# Patient Record
Sex: Female | Born: 1940 | Race: White | Hispanic: No | Smoking: Never smoker
Health system: Southern US, Community
[De-identification: ages and names within clinical notes are randomized; demographics above are authoritative.]

## PROBLEM LIST (undated history)

## (undated) DIAGNOSIS — E7800 Pure hypercholesterolemia, unspecified: Secondary | ICD-10-CM

## (undated) DIAGNOSIS — I10 Essential (primary) hypertension: Secondary | ICD-10-CM

## (undated) DIAGNOSIS — C801 Malignant (primary) neoplasm, unspecified: Secondary | ICD-10-CM

## (undated) DIAGNOSIS — M109 Gout, unspecified: Secondary | ICD-10-CM

## (undated) DIAGNOSIS — H9190 Unspecified hearing loss, unspecified ear: Secondary | ICD-10-CM

## (undated) DIAGNOSIS — L659 Nonscarring hair loss, unspecified: Secondary | ICD-10-CM

## (undated) DIAGNOSIS — E119 Type 2 diabetes mellitus without complications: Secondary | ICD-10-CM

## (undated) DIAGNOSIS — M1990 Unspecified osteoarthritis, unspecified site: Secondary | ICD-10-CM

## (undated) HISTORY — PX: BACK SURGERY: SHX140

## (undated) HISTORY — PX: ABDOMINAL HYSTERECTOMY: SHX81

## (undated) HISTORY — PX: CERVICAL DISC SURGERY: SHX588

## (undated) HISTORY — PX: CRANIECTOMY FOR EXCISION OF ACOUSTIC NEUROMA: SUR324

---

## 1988-02-02 HISTORY — PX: TOTAL KNEE ARTHROPLASTY: SHX125

## 2014-02-06 NOTE — Patient Instructions (Addendum)
Your procedure is scheduled on: 02/11/2014  Report to [facility] at  800   AM.  Call this number if you have problems the morning of surgery: [phone]   Do not eat food or drink liquids :After Midnight.      Take these medicines the morning of surgery with A SIP OF WATER:  Alloopurinol, azor, voltaren   Do not wear jewelry, make-up or nail polish.  Do not wear lotions, powders, or perfumes.   Do not shave 48 hours prior to surgery.  Do not bring valuables to the hospital.  Contacts, dentures or bridgework may not be worn into surgery.  Leave suitcase in the car. After surgery it may be brought to your room.  For patients admitted to the hospital, checkout time is 11:00 AM the day of discharge.   Patients discharged the day of surgery will not be allowed to drive home.  :     Please read over the following fact sheets that you were given: Coughing and Deep Breathing, Surgical Site Infection Prevention, Anesthesia Post-op Instructions and Care and Recovery After Surgery    Cataract A cataract is a clouding of the lens of the eye. When a lens becomes cloudy, vision is reduced based on the degree and nature of the clouding. Many cataracts reduce vision to some degree. Some cataracts make people more near-sighted as they develop. Other cataracts increase glare. Cataracts that are ignored and become worse can sometimes look white. The white color can be seen through the pupil. CAUSES   Aging. However, cataracts may occur at any age, even in newborns.   Certain drugs.   Trauma to the eye.   Certain diseases such as diabetes.   Specific eye diseases such as chronic inflammation inside the eye or a sudden attack of a rare form of glaucoma.   Inherited or acquired medical problems.  SYMPTOMS   Gradual, progressive drop in vision in the affected eye.   Severe, rapid visual loss. This most often happens when trauma is the cause.  DIAGNOSIS  To detect a cataract, an eye doctor examines  the lens. Cataracts are best diagnosed with an exam of the eyes with the pupils enlarged (dilated) by drops.  TREATMENT  For an early cataract, vision may improve by using different eyeglasses or stronger lighting. If that does not help your vision, surgery is the only effective treatment. A cataract needs to be surgically removed when vision loss interferes with your everyday activities, such as driving, reading, or watching TV. A cataract may also have to be removed if it prevents examination or treatment of another eye problem. Surgery removes the cloudy lens and usually replaces it with a substitute lens (intraocular lens, IOL).  At a time when both you and your doctor agree, the cataract will be surgically removed. If you have cataracts in both eyes, only one is usually removed at a time. This allows the operated eye to heal and be out of danger from any possible problems after surgery (such as infection or poor wound healing). In rare cases, a cataract may be doing damage to your eye. In these cases, your caregiver may advise surgical removal right away. The vast majority of people who have cataract surgery have better vision afterward. HOME CARE INSTRUCTIONS  If you are not planning surgery, you may be asked to do the following:  Use different eyeglasses.   Use stronger or brighter lighting.   Ask your eye doctor about reducing your medicine dose  or changing medicines if it is thought that a medicine caused your cataract. Changing medicines does not make the cataract go away on its own.   Become familiar with your surroundings. Poor vision can lead to injury. Avoid bumping into things on the affected side. You are at a higher risk for tripping or falling.   Exercise extreme care when driving or operating machinery.   Wear sunglasses if you are sensitive to bright light or experiencing problems with glare.  SEEK IMMEDIATE MEDICAL CARE IF:   You have a worsening or sudden vision loss.    You notice redness, swelling, or increasing pain in the eye.   You have a fever.  Document Released: 01/18/2005 Document Revised: 01/07/2011 Document Reviewed: 09/11/2010 [facility] Patient Information 2012 [place].PATIENT INSTRUCTIONS POST-ANESTHESIA  IMMEDIATELY FOLLOWING SURGERY:  Do not drive or operate machinery for the first twenty four hours after surgery.  Do not make any important decisions for twenty four hours after surgery or while taking narcotic pain medications or sedatives.  If you develop intractable nausea and vomiting or a severe headache please notify your doctor immediately.  FOLLOW-UP:  Please make an appointment with your surgeon as instructed. You do not need to follow up with anesthesia unless specifically instructed to do so.  WOUND CARE INSTRUCTIONS (if applicable):  Keep a dry clean dressing on the anesthesia/puncture wound site if there is drainage.  Once the wound has quit draining you may leave it open to air.  Generally you should leave the bandage intact for twenty four hours unless there is drainage.  If the epidural site drains for more than 36-48 hours please call the anesthesia department.  QUESTIONS?:  Please feel free to call your physician or the hospital operator if you have any questions, and they will be happy to assist you.

## 2014-02-07 ENCOUNTER — Encounter (HOSPITAL_COMMUNITY)
Admission: RE | Admit: 2014-02-07 | Discharge: 2014-02-07 | Disposition: A | Discharge status: Home / Self Care | Payer: Medicare Other | Source: Ambulatory Visit | Attending: Ophthalmology | Admitting: Ophthalmology

## 2014-02-07 ENCOUNTER — Encounter (HOSPITAL_COMMUNITY): Payer: Self-pay

## 2014-02-07 DIAGNOSIS — Z01818 Encounter for other preprocedural examination: Secondary | ICD-10-CM | POA: Diagnosis not present

## 2014-02-07 DIAGNOSIS — I4510 Unspecified right bundle-branch block: Secondary | ICD-10-CM | POA: Insufficient documentation

## 2014-02-07 DIAGNOSIS — I252 Old myocardial infarction: Secondary | ICD-10-CM | POA: Diagnosis not present

## 2014-02-07 HISTORY — DX: Gout, unspecified: M10.9

## 2014-02-07 HISTORY — DX: Unspecified hearing loss, unspecified ear: H91.90

## 2014-02-07 HISTORY — DX: Unspecified osteoarthritis, unspecified site: M19.90

## 2014-02-07 HISTORY — DX: Pure hypercholesterolemia, unspecified: E78.00

## 2014-02-07 HISTORY — DX: Malignant (primary) neoplasm, unspecified: C80.1

## 2014-02-07 HISTORY — DX: Essential (primary) hypertension: I10

## 2014-02-07 HISTORY — DX: Type 2 diabetes mellitus without complications: E11.9

## 2014-02-07 LAB — BASIC METABOLIC PANEL
Anion gap: 5 (ref 5–15)
BUN: 29 mg/dL — AB (ref 6–23)
CALCIUM: 9.4 mg/dL (ref 8.4–10.5)
CO2: 26 mmol/L (ref 19–32)
Chloride: 107 mEq/L (ref 96–112)
Creatinine, Ser: 1.27 mg/dL — ABNORMAL HIGH (ref 0.50–1.10)
GFR calc Af Amer: 46 mL/min — ABNORMAL LOW (ref >90)
GFR calc non Af Amer: 40 mL/min — ABNORMAL LOW (ref >90)
GLUCOSE: 108 mg/dL — AB (ref 70–99)
Potassium: 4.7 mmol/L (ref 3.5–5.1)
SODIUM: 138 mmol/L (ref 135–145)

## 2014-02-07 LAB — HEMOGLOBIN AND HEMATOCRIT, BLOOD
HCT: 40.7 % (ref 36.0–46.0)
Hemoglobin: 13.1 g/dL (ref 12.0–15.0)

## 2014-02-07 NOTE — Pre-Procedure Instructions (Signed)
Patient given information to sign up for my chart at home. 

## 2014-02-11 ENCOUNTER — Encounter (HOSPITAL_COMMUNITY): Payer: Self-pay | Admitting: *Deleted

## 2014-02-11 ENCOUNTER — Ambulatory Visit (HOSPITAL_COMMUNITY)
Admission: RE | Admit: 2014-02-11 | Discharge: 2014-02-11 | Disposition: A | Discharge status: Home / Self Care | Payer: Medicare Other | Source: Ambulatory Visit | Attending: Ophthalmology | Admitting: Ophthalmology

## 2014-02-11 ENCOUNTER — Ambulatory Visit (HOSPITAL_COMMUNITY): Payer: Medicare Other | Admitting: Anesthesiology

## 2014-02-11 ENCOUNTER — Encounter (HOSPITAL_COMMUNITY)
Admission: RE | Disposition: A | Discharge status: Home / Self Care | Payer: Self-pay | Source: Ambulatory Visit | Attending: Ophthalmology

## 2014-02-11 DIAGNOSIS — H9192 Unspecified hearing loss, left ear: Secondary | ICD-10-CM | POA: Diagnosis not present

## 2014-02-11 DIAGNOSIS — Z888 Allergy status to other drugs, medicaments and biological substances status: Secondary | ICD-10-CM | POA: Insufficient documentation

## 2014-02-11 DIAGNOSIS — M1990 Unspecified osteoarthritis, unspecified site: Secondary | ICD-10-CM | POA: Insufficient documentation

## 2014-02-11 DIAGNOSIS — E118 Type 2 diabetes mellitus with unspecified complications: Secondary | ICD-10-CM | POA: Diagnosis not present

## 2014-02-11 DIAGNOSIS — Z85820 Personal history of malignant melanoma of skin: Secondary | ICD-10-CM | POA: Insufficient documentation

## 2014-02-11 DIAGNOSIS — I10 Essential (primary) hypertension: Secondary | ICD-10-CM | POA: Diagnosis not present

## 2014-02-11 DIAGNOSIS — M109 Gout, unspecified: Secondary | ICD-10-CM | POA: Insufficient documentation

## 2014-02-11 DIAGNOSIS — H25812 Combined forms of age-related cataract, left eye: Secondary | ICD-10-CM | POA: Insufficient documentation

## 2014-02-11 DIAGNOSIS — E780 Pure hypercholesterolemia: Secondary | ICD-10-CM | POA: Diagnosis not present

## 2014-02-11 HISTORY — PX: CATARACT EXTRACTION W/PHACO: SHX586

## 2014-02-11 LAB — GLUCOSE, CAPILLARY: Glucose-Capillary: 101 mg/dL — ABNORMAL HIGH (ref 70–99)

## 2014-02-11 SURGERY — PHACOEMULSIFICATION, CATARACT, WITH IOL INSERTION
Anesthesia: Monitor Anesthesia Care | Site: Eye | Laterality: Left

## 2014-02-11 MED ORDER — NEOMYCIN-POLYMYXIN-DEXAMETH 3.5-10000-0.1 OP SUSP
OPHTHALMIC | Status: DC | PRN
  Administered 2014-02-11: 2 [drp] via OPHTHALMIC

## 2014-02-11 MED ORDER — CYCLOPENTOLATE-PHENYLEPHRINE 0.2-1 % OP SOLN
1.0000 [drp] | OPHTHALMIC | Status: AC
  Administered 2014-02-11 (×3): 1 [drp] via OPHTHALMIC

## 2014-02-11 MED ORDER — POVIDONE-IODINE 5 % OP SOLN
OPHTHALMIC | Status: DC | PRN
  Administered 2014-02-11: 1

## 2014-02-11 MED ORDER — MIDAZOLAM HCL 2 MG/2ML IJ SOLN
INTRAMUSCULAR | Status: AC
Start: 2014-02-11 — End: 2014-02-11
  Filled 2014-02-11: qty 2

## 2014-02-11 MED ORDER — PROVISC 10 MG/ML IO SOLN
INTRAOCULAR | Status: DC | PRN
  Administered 2014-02-11: 0.85 mL via INTRAOCULAR

## 2014-02-11 MED ORDER — MIDAZOLAM HCL 2 MG/2ML IJ SOLN
1.0000 mg | INTRAMUSCULAR | Status: DC | PRN
  Administered 2014-02-11: 2 mg via INTRAVENOUS
  Filled 2014-02-11: qty 2

## 2014-02-11 MED ORDER — FENTANYL CITRATE 0.05 MG/ML IJ SOLN
25.0000 ug | INTRAMUSCULAR | Status: AC
  Administered 2014-02-11 (×2): 25 ug via INTRAVENOUS
  Filled 2014-02-11: qty 2

## 2014-02-11 MED ORDER — EPINEPHRINE HCL 1 MG/ML IJ SOLN
INTRAMUSCULAR | Status: AC
  Filled 2014-02-11: qty 1

## 2014-02-11 MED ORDER — ONDANSETRON HCL 4 MG/2ML IJ SOLN: 4.0000 mg | Freq: Once | INTRAMUSCULAR | Status: DC | PRN

## 2014-02-11 MED ORDER — LACTATED RINGERS IV SOLN
INTRAVENOUS | Status: DC
  Administered 2014-02-11: 09:00:00 via INTRAVENOUS

## 2014-02-11 MED ORDER — BSS IO SOLN
INTRAOCULAR | Status: DC | PRN
  Administered 2014-02-11: 15 mL via INTRAOCULAR

## 2014-02-11 MED ORDER — LIDOCAINE HCL 3.5 % OP GEL
1.0000 "application " | Freq: Once | OPHTHALMIC | Status: AC
  Administered 2014-02-11: 1 via OPHTHALMIC

## 2014-02-11 MED ORDER — EPINEPHRINE HCL 1 MG/ML IJ SOLN
INTRAOCULAR | Status: DC | PRN
  Administered 2014-02-11: 09:00:00

## 2014-02-11 MED ORDER — MIDAZOLAM HCL 5 MG/5ML IJ SOLN
INTRAMUSCULAR | Status: DC | PRN
  Administered 2014-02-11: 2 mg via INTRAVENOUS

## 2014-02-11 MED ORDER — PHENYLEPHRINE HCL 2.5 % OP SOLN
1.0000 [drp] | OPHTHALMIC | Status: AC
  Administered 2014-02-11 (×3): 1 [drp] via OPHTHALMIC

## 2014-02-11 MED ORDER — FENTANYL CITRATE 0.05 MG/ML IJ SOLN: 25.0000 ug | INTRAMUSCULAR | Status: DC | PRN

## 2014-02-11 MED ORDER — TETRACAINE HCL 0.5 % OP SOLN
1.0000 [drp] | OPHTHALMIC | Status: AC
  Administered 2014-02-11 (×3): 1 [drp] via OPHTHALMIC

## 2014-02-11 MED ORDER — LIDOCAINE HCL (PF) 1 % IJ SOLN
INTRAMUSCULAR | Status: DC | PRN
  Administered 2014-02-11: .3 mL

## 2014-02-11 SURGICAL SUPPLY — 34 items
CAPSULAR TENSION RING-AMO (OPHTHALMIC RELATED) IMPLANT
CLOTH BEACON ORANGE TIMEOUT ST (SAFETY) ×3 IMPLANT
EYE SHIELD UNIVERSAL CLEAR (GAUZE/BANDAGES/DRESSINGS) ×3 IMPLANT
GLOVE BIO SURGEON STRL SZ 6.5 (GLOVE) IMPLANT
GLOVE BIO SURGEONS STRL SZ 6.5 (GLOVE)
GLOVE BIOGEL PI IND STRL 6.5 (GLOVE) ×1 IMPLANT
GLOVE BIOGEL PI IND STRL 7.0 (GLOVE) IMPLANT
GLOVE BIOGEL PI IND STRL 7.5 (GLOVE) IMPLANT
GLOVE BIOGEL PI INDICATOR 6.5 (GLOVE) ×2
GLOVE BIOGEL PI INDICATOR 7.0 (GLOVE)
GLOVE BIOGEL PI INDICATOR 7.5 (GLOVE)
GLOVE ECLIPSE 6.5 STRL STRAW (GLOVE) IMPLANT
GLOVE ECLIPSE 7.0 STRL STRAW (GLOVE) IMPLANT
GLOVE ECLIPSE 7.5 STRL STRAW (GLOVE) IMPLANT
GLOVE EXAM NITRILE LRG STRL (GLOVE) ×3 IMPLANT
GLOVE EXAM NITRILE MD LF STRL (GLOVE) IMPLANT
GLOVE SKINSENSE NS SZ6.5 (GLOVE)
GLOVE SKINSENSE NS SZ7.0 (GLOVE)
GLOVE SKINSENSE STRL SZ6.5 (GLOVE) IMPLANT
GLOVE SKINSENSE STRL SZ7.0 (GLOVE) IMPLANT
KIT VITRECTOMY (OPHTHALMIC RELATED) IMPLANT
PAD ARMBOARD 7.5X6 YLW CONV (MISCELLANEOUS) ×3 IMPLANT
PROC W NO LENS (INTRAOCULAR LENS)
PROC W SPEC LENS (INTRAOCULAR LENS)
PROCESS W NO LENS (INTRAOCULAR LENS) IMPLANT
PROCESS W SPEC LENS (INTRAOCULAR LENS) IMPLANT
RETRACTOR IRIS SIGHTPATH (OPHTHALMIC RELATED) IMPLANT
RING MALYGIN (MISCELLANEOUS) IMPLANT
SIGHTPATH CAT PROC W REG LENS (Ophthalmic Related) ×3 IMPLANT
SYRINGE LUER LOK 1CC (MISCELLANEOUS) ×3 IMPLANT
TAPE SURG TRANSPARENT 2IN (GAUZE/BANDAGES/DRESSINGS) ×1 IMPLANT
TAPE TRANSPARENT 2IN (GAUZE/BANDAGES/DRESSINGS) ×2
VISCOELASTIC ADDITIONAL (OPHTHALMIC RELATED) IMPLANT
WATER STERILE IRR 250ML POUR (IV SOLUTION) ×3 IMPLANT

## 2014-02-11 NOTE — Discharge Instructions (Signed)

## 2014-02-11 NOTE — Anesthesia Preprocedure Evaluation (Signed)
Anesthesia Evaluation  Patient identified by MRN, date of birth, ID band Patient awake    Reviewed: Allergy & Precautions, NPO status , Patient's Chart, lab work & pertinent test results  Airway Mallampati: II       Dental  (+) Teeth Intact   Pulmonary neg pulmonary ROS,  breath sounds clear to auscultation        Cardiovascular hypertension, Rhythm:Regular Rate:Normal     Neuro/Psych Hx brain tumor x 2    GI/Hepatic   Endo/Other  diabetes (off meds now), Type 2  Renal/GU      Musculoskeletal  (+) Arthritis -,   Abdominal   Peds  Hematology   Anesthesia Other Findings   Reproductive/Obstetrics                             Anesthesia Physical Anesthesia Plan  ASA: III  Anesthesia Plan: MAC   Post-op Pain Management:    Induction: Intravenous  Airway Management Planned: Nasal Cannula  Additional Equipment:   Intra-op Plan:   Post-operative Plan:   Informed Consent: I have reviewed the patients History and Physical, chart, labs and discussed the procedure including the risks, benefits and alternatives for the proposed anesthesia with the patient or authorized representative who has indicated his/her understanding and acceptance.     Plan Discussed with:   Anesthesia Plan Comments:         Anesthesia Quick Evaluation

## 2014-02-11 NOTE — Transfer of Care (Signed)
Immediate Anesthesia Transfer of Care Note  Patient: Olivia Powers  Procedure(s) Performed: Procedure(s) with comments: CATARACT EXTRACTION PHACO AND INTRAOCULAR LENS PLACEMENT LEFT EYE (Left) - CDE:4.93  Patient Location: Short Stay  Anesthesia Type:MAC  Level of Consciousness: awake  Airway & Oxygen Therapy: Patient Spontanous Breathing  Post-op Assessment: Report given to PACU RN  Post vital signs: Reviewed  Complications: No apparent anesthesia complications

## 2014-02-11 NOTE — Anesthesia Postprocedure Evaluation (Signed)
  Anesthesia Post-op Note  Patient: Olivia Powers  Procedure(s) Performed: Procedure(s) with comments: CATARACT EXTRACTION PHACO AND INTRAOCULAR LENS PLACEMENT LEFT EYE (Left) - CDE:4.93  Patient Location: Short Stay  Anesthesia Type:MAC  Level of Consciousness: awake  Airway and Oxygen Therapy: Patient Spontanous Breathing  Post-op Pain: none  Post-op Assessment: Post-op Vital signs reviewed, Patient's Cardiovascular Status Stable, Respiratory Function Stable, Patent Airway and No signs of Nausea or vomiting  Post-op Vital Signs: Reviewed and stable  Last Vitals:  Filed Vitals:   02/11/14 0915  BP: 126/69  Pulse:   Temp:   Resp: 18    Complications: No apparent anesthesia complications

## 2014-02-11 NOTE — Op Note (Signed)
Date of Admission: 02/11/2014  Date of Surgery: 02/11/2014   Pre-Op Dx: Cataract Left Eye  Post-Op Dx: Senile Combined Cataract Left  Eye,  Dx Code P92.924  Surgeon: Tonny Branch, M.D.  Assistants: None  Anesthesia: Topical with MAC  Indications: Painless, progressive loss of vision with compromise of daily activities.  Surgery: Cataract Extraction with Intraocular lens Implant Left Eye  Discription: The patient had dilating drops and viscous lidocaine placed into the Left eye in the pre-op holding area. After transfer to the operating room, a time out was performed. The patient was then prepped and draped. Beginning with a 29 degree blade a paracentesis port was made at the surgeon's 2 o'clock position. The anterior chamber was then filled with 1% non-preserved lidocaine. This was followed by filling the anterior chamber with Provisc.  A 2.62mm keratome blade was used to make a clear corneal incision at the temporal limbus.  A bent cystatome needle was used to create a continuous tear capsulotomy. Hydrodissection was performed with balanced salt solution on a Fine canula. The lens nucleus was then removed using the phacoemulsification handpiece. Residual cortex was removed with the I&A handpiece. The anterior chamber and capsular bag were refilled with Provisc. A posterior chamber intraocular lens was placed into the capsular bag with it's injector. The implant was positioned with the Kuglan hook. The Provisc was then removed from the anterior chamber and capsular bag with the I&A handpiece. Stromal hydration of the main incision and paracentesis port was performed with BSS on a Fine canula. The wounds were tested for leak which was negative. The patient tolerated the procedure well. There were no operative complications. The patient was then transferred to the recovery room in stable condition.  Complications: None  Specimen: None  EBL: None  Prosthetic device: Hoya iSert 250, power 19.0 D, SN  [id number].

## 2014-02-11 NOTE — H&P (Signed)
I have reviewed the H&P, the patient was re-examined, and I have identified no interval changes in medical condition and plan of care since the history and physical of record  

## 2014-02-12 ENCOUNTER — Encounter (HOSPITAL_COMMUNITY): Payer: Self-pay | Admitting: Ophthalmology

## 2014-03-07 ENCOUNTER — Encounter (HOSPITAL_COMMUNITY): Payer: Self-pay

## 2014-03-07 ENCOUNTER — Encounter (HOSPITAL_COMMUNITY)
Admission: RE | Admit: 2014-03-07 | Discharge: 2014-03-07 | Disposition: A | Discharge status: Home / Self Care | Payer: Medicare Other | Source: Ambulatory Visit | Attending: Ophthalmology | Admitting: Ophthalmology

## 2014-03-08 MED ORDER — CYCLOPENTOLATE-PHENYLEPHRINE OP SOLN OPTIME - NO CHARGE
OPHTHALMIC | Status: AC
  Filled 2014-03-08: qty 2

## 2014-03-08 MED ORDER — NEOMYCIN-POLYMYXIN-DEXAMETH 3.5-10000-0.1 OP SUSP
OPHTHALMIC | Status: AC
  Filled 2014-03-08: qty 5

## 2014-03-08 MED ORDER — LIDOCAINE HCL 3.5 % OP GEL
OPHTHALMIC | Status: AC
  Filled 2014-03-08: qty 1

## 2014-03-08 MED ORDER — PHENYLEPHRINE HCL 2.5 % OP SOLN
OPHTHALMIC | Status: AC
  Filled 2014-03-08: qty 15

## 2014-03-08 MED ORDER — LIDOCAINE HCL (PF) 1 % IJ SOLN
INTRAMUSCULAR | Status: AC
  Filled 2014-03-08: qty 2

## 2014-03-08 MED ORDER — TETRACAINE HCL 0.5 % OP SOLN
OPHTHALMIC | Status: AC
  Filled 2014-03-08: qty 2

## 2014-03-11 ENCOUNTER — Ambulatory Visit (HOSPITAL_COMMUNITY): Payer: Medicare Other | Admitting: Anesthesiology

## 2014-03-11 ENCOUNTER — Encounter (HOSPITAL_COMMUNITY): Payer: Self-pay

## 2014-03-11 ENCOUNTER — Ambulatory Visit (HOSPITAL_COMMUNITY)
Admission: RE | Admit: 2014-03-11 | Discharge: 2014-03-11 | Disposition: A | Discharge status: Home / Self Care | Payer: Medicare Other | Source: Ambulatory Visit | Attending: Ophthalmology | Admitting: Ophthalmology

## 2014-03-11 ENCOUNTER — Encounter (HOSPITAL_COMMUNITY)
Admission: RE | Disposition: A | Discharge status: Home / Self Care | Payer: Self-pay | Source: Ambulatory Visit | Attending: Ophthalmology

## 2014-03-11 DIAGNOSIS — H25811 Combined forms of age-related cataract, right eye: Secondary | ICD-10-CM | POA: Diagnosis present

## 2014-03-11 HISTORY — PX: CATARACT EXTRACTION W/PHACO: SHX586

## 2014-03-11 LAB — GLUCOSE, CAPILLARY: Glucose-Capillary: 97 mg/dL (ref 70–99)

## 2014-03-11 SURGERY — PHACOEMULSIFICATION, CATARACT, WITH IOL INSERTION
Anesthesia: Monitor Anesthesia Care | Site: Eye | Laterality: Right

## 2014-03-11 MED ORDER — CYCLOPENTOLATE-PHENYLEPHRINE 0.2-1 % OP SOLN
1.0000 [drp] | OPHTHALMIC | Status: AC
  Administered 2014-03-11 (×3): 1 [drp] via OPHTHALMIC

## 2014-03-11 MED ORDER — TETRACAINE HCL 0.5 % OP SOLN
1.0000 [drp] | OPHTHALMIC | Status: AC
  Administered 2014-03-11 (×3): 1 [drp] via OPHTHALMIC

## 2014-03-11 MED ORDER — MIDAZOLAM HCL 2 MG/2ML IJ SOLN
1.0000 mg | INTRAMUSCULAR | Status: DC | PRN
  Administered 2014-03-11: 1 mg via INTRAVENOUS
  Administered 2014-03-11: 2 mg via INTRAVENOUS

## 2014-03-11 MED ORDER — EPINEPHRINE HCL 1 MG/ML IJ SOLN
INTRAMUSCULAR | Status: AC
  Filled 2014-03-11: qty 1

## 2014-03-11 MED ORDER — LIDOCAINE HCL (PF) 1 % IJ SOLN
INTRAMUSCULAR | Status: DC | PRN
  Administered 2014-03-11: .5 mL

## 2014-03-11 MED ORDER — MIDAZOLAM HCL 2 MG/2ML IJ SOLN
INTRAMUSCULAR | Status: AC
  Filled 2014-03-11: qty 2

## 2014-03-11 MED ORDER — NEOMYCIN-POLYMYXIN-DEXAMETH 3.5-10000-0.1 OP SUSP
OPHTHALMIC | Status: DC | PRN
  Administered 2014-03-11: 2 [drp] via OPHTHALMIC

## 2014-03-11 MED ORDER — PROVISC 10 MG/ML IO SOLN
INTRAOCULAR | Status: DC | PRN
  Administered 2014-03-11: 0.85 mL via INTRAOCULAR

## 2014-03-11 MED ORDER — PHENYLEPHRINE HCL 2.5 % OP SOLN
1.0000 [drp] | OPHTHALMIC | Status: AC
  Administered 2014-03-11 (×3): 1 [drp] via OPHTHALMIC

## 2014-03-11 MED ORDER — FENTANYL CITRATE 0.05 MG/ML IJ SOLN
INTRAMUSCULAR | Status: AC
  Filled 2014-03-11: qty 2

## 2014-03-11 MED ORDER — LIDOCAINE HCL 3.5 % OP GEL: 1.0000 "application " | Freq: Once | OPHTHALMIC | Status: DC

## 2014-03-11 MED ORDER — BSS IO SOLN
INTRAOCULAR | Status: DC | PRN
  Administered 2014-03-11: 15 mL

## 2014-03-11 MED ORDER — LACTATED RINGERS IV SOLN
INTRAVENOUS | Status: DC
  Administered 2014-03-11: 07:00:00 via INTRAVENOUS

## 2014-03-11 MED ORDER — POVIDONE-IODINE 5 % OP SOLN
OPHTHALMIC | Status: DC | PRN
  Administered 2014-03-11: 1 via OPHTHALMIC

## 2014-03-11 MED ORDER — EPINEPHRINE HCL 1 MG/ML IJ SOLN
INTRAOCULAR | Status: DC | PRN
  Administered 2014-03-11: 500 mL

## 2014-03-11 MED ORDER — FENTANYL CITRATE 0.05 MG/ML IJ SOLN
25.0000 ug | INTRAMUSCULAR | Status: AC
  Administered 2014-03-11 (×2): 25 ug via INTRAVENOUS

## 2014-03-11 SURGICAL SUPPLY — 11 items

## 2014-03-11 NOTE — Discharge Instructions (Signed)

## 2014-03-11 NOTE — Anesthesia Postprocedure Evaluation (Signed)
  Anesthesia Post-op Note  Patient: Olivia Powers  Procedure(s) Performed: Procedure(s): CATARACT EXTRACTION PHACO AND INTRAOCULAR LENS PLACEMENT RIGHT EYE CDE=8.52 (Right)  Patient Location: Short Stay  Anesthesia Type:MAC  Level of Consciousness: awake, alert  and oriented  Airway and Oxygen Therapy: Patient Spontanous Breathing  Post-op Pain: none  Post-op Assessment: Post-op Vital signs reviewed, Patient's Cardiovascular Status Stable, Respiratory Function Stable, Patent Airway and No signs of Nausea or vomiting  Post-op Vital Signs: Reviewed and stable  Last Vitals:  Filed Vitals:   03/11/14 0743  BP: 122/61  Pulse:   Temp:   Resp: 20    Complications: No apparent anesthesia complications

## 2014-03-11 NOTE — Transfer of Care (Signed)
Immediate Anesthesia Transfer of Care Note  Patient: Olivia Powers  Procedure(s) Performed: Procedure(s): CATARACT EXTRACTION PHACO AND INTRAOCULAR LENS PLACEMENT RIGHT EYE CDE=8.52 (Right)  Patient Location: Short Stay  Anesthesia Type:MAC  Level of Consciousness: awake  Airway & Oxygen Therapy: Patient Spontanous Breathing  Post-op Assessment: Report given to RN  Post vital signs: Reviewed  Last Vitals:  Filed Vitals:   03/11/14 0743  BP: 122/61  Pulse:   Temp:   Resp: 20    Complications: No apparent anesthesia complications

## 2014-03-11 NOTE — H&P (Signed)
I have reviewed the H&P, the patient was re-examined, and I have identified no interval changes in medical condition and plan of care since the history and physical of record  

## 2014-03-11 NOTE — Anesthesia Preprocedure Evaluation (Signed)
Anesthesia Evaluation  Patient identified by MRN, date of birth, ID band Patient awake    Reviewed: Allergy & Precautions, NPO status , Patient's Chart, lab work & pertinent test results  Airway Mallampati: II       Dental  (+) Teeth Intact   Pulmonary neg pulmonary ROS,  breath sounds clear to auscultation        Cardiovascular hypertension, Rhythm:Regular Rate:Normal     Neuro/Psych Hx brain tumor x 2    GI/Hepatic   Endo/Other  diabetes, Type 2  Renal/GU      Musculoskeletal  (+) Arthritis -,   Abdominal   Peds  Hematology   Anesthesia Other Findings   Reproductive/Obstetrics                             Anesthesia Physical Anesthesia Plan  ASA: III  Anesthesia Plan: MAC   Post-op Pain Management:    Induction: Intravenous  Airway Management Planned: Nasal Cannula  Additional Equipment:   Intra-op Plan:   Post-operative Plan:   Informed Consent: I have reviewed the patients History and Physical, chart, labs and discussed the procedure including the risks, benefits and alternatives for the proposed anesthesia with the patient or authorized representative who has indicated his/her understanding and acceptance.     Plan Discussed with:   Anesthesia Plan Comments:         Anesthesia Quick Evaluation

## 2014-03-11 NOTE — Op Note (Signed)
Date of Admission: 03/11/2014  Date of Surgery: 03/11/2014   Pre-Op Dx: Cataract Right Eye  Post-Op Dx: Senile Combined Cataract Right  Eye,  Dx Code T73.220  Surgeon: Tonny Branch, M.D.  Assistants: None  Anesthesia: Topical with MAC  Indications: Painless, progressive loss of vision with compromise of daily activities.  Surgery: Cataract Extraction with Intraocular lens Implant Right Eye  Discription: The patient had dilating drops and viscous lidocaine placed into the Right eye in the pre-op holding area. After transfer to the operating room, a time out was performed. The patient was then prepped and draped. Beginning with a 66 degree blade a paracentesis port was made at the surgeon's 2 o'clock position. The anterior chamber was then filled with 1% non-preserved lidocaine. This was followed by filling the anterior chamber with Provisc.  A 2.44mm keratome blade was used to make a clear corneal incision at the temporal limbus.  A bent cystatome needle was used to create a continuous tear capsulotomy. Hydrodissection was performed with balanced salt solution on a Fine canula. The lens nucleus was then removed using the phacoemulsification handpiece. Residual cortex was removed with the I&A handpiece. The anterior chamber and capsular bag were refilled with Provisc. A posterior chamber intraocular lens was placed into the capsular bag with it's injector. The implant was positioned with the Kuglan hook. The Provisc was then removed from the anterior chamber and capsular bag with the I&A handpiece. Stromal hydration of the main incision and paracentesis port was performed with BSS on a Fine canula. The wounds were tested for leak which was negative. The patient tolerated the procedure well. There were no operative complications. The patient was then transferred to the recovery room in stable condition.  Complications: None  Specimen: None  EBL: None  Prosthetic device: Hoya iSert 250, power 17.5 D,  SN [id number].

## 2014-03-12 ENCOUNTER — Encounter (HOSPITAL_COMMUNITY): Payer: Self-pay | Admitting: Ophthalmology

## 2014-04-22 ENCOUNTER — Inpatient Hospital Stay (HOSPITAL_COMMUNITY)
Admission: AD | Admit: 2014-04-22 | Discharge: 2014-04-23 | DRG: 378 | Disposition: A | Discharge status: Home / Self Care | Payer: Medicare Other | Source: Other Acute Inpatient Hospital | Attending: Internal Medicine | Admitting: Internal Medicine

## 2014-04-22 ENCOUNTER — Inpatient Hospital Stay (HOSPITAL_COMMUNITY): Payer: Medicare Other | Admitting: Anesthesiology

## 2014-04-22 ENCOUNTER — Inpatient Hospital Stay (HOSPITAL_COMMUNITY): Payer: Medicare Other

## 2014-04-22 ENCOUNTER — Encounter (HOSPITAL_COMMUNITY)
Admission: AD | Disposition: A | Discharge status: Home / Self Care | Payer: Self-pay | Source: Other Acute Inpatient Hospital | Attending: Internal Medicine

## 2014-04-22 ENCOUNTER — Encounter (HOSPITAL_COMMUNITY): Payer: Self-pay | Admitting: Physician Assistant

## 2014-04-22 DIAGNOSIS — Z90710 Acquired absence of both cervix and uterus: Secondary | ICD-10-CM | POA: Diagnosis not present

## 2014-04-22 DIAGNOSIS — Z96653 Presence of artificial knee joint, bilateral: Secondary | ICD-10-CM | POA: Diagnosis not present

## 2014-04-22 DIAGNOSIS — E119 Type 2 diabetes mellitus without complications: Secondary | ICD-10-CM

## 2014-04-22 DIAGNOSIS — M10079 Idiopathic gout, unspecified ankle and foot: Secondary | ICD-10-CM | POA: Diagnosis not present

## 2014-04-22 DIAGNOSIS — Z9842 Cataract extraction status, left eye: Secondary | ICD-10-CM | POA: Diagnosis not present

## 2014-04-22 DIAGNOSIS — Z885 Allergy status to narcotic agent status: Secondary | ICD-10-CM | POA: Diagnosis not present

## 2014-04-22 DIAGNOSIS — R1110 Vomiting, unspecified: Secondary | ICD-10-CM | POA: Diagnosis present

## 2014-04-22 DIAGNOSIS — H9190 Unspecified hearing loss, unspecified ear: Secondary | ICD-10-CM | POA: Diagnosis present

## 2014-04-22 DIAGNOSIS — K922 Gastrointestinal hemorrhage, unspecified: Secondary | ICD-10-CM | POA: Diagnosis not present

## 2014-04-22 DIAGNOSIS — K921 Melena: Secondary | ICD-10-CM | POA: Diagnosis present

## 2014-04-22 DIAGNOSIS — R6881 Early satiety: Secondary | ICD-10-CM | POA: Diagnosis not present

## 2014-04-22 DIAGNOSIS — R000 Tachycardia, unspecified: Secondary | ICD-10-CM | POA: Diagnosis not present

## 2014-04-22 DIAGNOSIS — D62 Acute posthemorrhagic anemia: Secondary | ICD-10-CM | POA: Diagnosis present

## 2014-04-22 DIAGNOSIS — E780 Pure hypercholesterolemia: Secondary | ICD-10-CM | POA: Diagnosis present

## 2014-04-22 DIAGNOSIS — M7989 Other specified soft tissue disorders: Secondary | ICD-10-CM | POA: Diagnosis present

## 2014-04-22 DIAGNOSIS — I10 Essential (primary) hypertension: Secondary | ICD-10-CM | POA: Diagnosis present

## 2014-04-22 DIAGNOSIS — N179 Acute kidney failure, unspecified: Secondary | ICD-10-CM | POA: Diagnosis present

## 2014-04-22 DIAGNOSIS — K449 Diaphragmatic hernia without obstruction or gangrene: Secondary | ICD-10-CM | POA: Diagnosis not present

## 2014-04-22 DIAGNOSIS — Z85820 Personal history of malignant melanoma of skin: Secondary | ICD-10-CM | POA: Diagnosis not present

## 2014-04-22 DIAGNOSIS — K2970 Gastritis, unspecified, without bleeding: Secondary | ICD-10-CM | POA: Diagnosis not present

## 2014-04-22 DIAGNOSIS — M1990 Unspecified osteoarthritis, unspecified site: Secondary | ICD-10-CM | POA: Diagnosis not present

## 2014-04-22 DIAGNOSIS — R1013 Epigastric pain: Secondary | ICD-10-CM

## 2014-04-22 DIAGNOSIS — E785 Hyperlipidemia, unspecified: Secondary | ICD-10-CM | POA: Diagnosis not present

## 2014-04-22 DIAGNOSIS — M109 Gout, unspecified: Secondary | ICD-10-CM | POA: Diagnosis not present

## 2014-04-22 HISTORY — PX: ESOPHAGOGASTRODUODENOSCOPY: SHX5428

## 2014-04-22 HISTORY — DX: Nonscarring hair loss, unspecified: L65.9

## 2014-04-22 LAB — HEMOGLOBIN AND HEMATOCRIT, BLOOD
HCT: 28 % — ABNORMAL LOW (ref 36.0–46.0)
Hemoglobin: 9.2 g/dL — ABNORMAL LOW (ref 12.0–15.0)

## 2014-04-22 LAB — COMPREHENSIVE METABOLIC PANEL WITH GFR
ALT: 10 U/L (ref 0–35)
AST: 14 U/L (ref 0–37)
Albumin: 3.2 g/dL — ABNORMAL LOW (ref 3.5–5.2)
Alkaline Phosphatase: 48 U/L (ref 39–117)
Anion gap: 5 (ref 5–15)
BUN: 54 mg/dL — ABNORMAL HIGH (ref 6–23)
CO2: 22 mmol/L (ref 19–32)
Calcium: 8.9 mg/dL (ref 8.4–10.5)
Chloride: 115 mmol/L — ABNORMAL HIGH (ref 96–112)
Creatinine, Ser: 1.01 mg/dL (ref 0.50–1.10)
GFR calc Af Amer: 61 mL/min — ABNORMAL LOW
GFR calc non Af Amer: 53 mL/min — ABNORMAL LOW
Glucose, Bld: 100 mg/dL — ABNORMAL HIGH (ref 70–99)
Potassium: 4.5 mmol/L (ref 3.5–5.1)
Sodium: 142 mmol/L (ref 135–145)
Total Bilirubin: 0.4 mg/dL (ref 0.3–1.2)
Total Protein: 5.1 g/dL — ABNORMAL LOW (ref 6.0–8.3)

## 2014-04-22 LAB — CBC WITH DIFFERENTIAL/PLATELET
Basophils Absolute: 0.1 10*3/uL (ref 0.0–0.1)
Basophils Relative: 1 % (ref 0–1)
EOS ABS: 0.1 10*3/uL (ref 0.0–0.7)
EOS PCT: 1 % (ref 0–5)
HEMATOCRIT: 28.9 % — AB (ref 36.0–46.0)
Hemoglobin: 9.7 g/dL — ABNORMAL LOW (ref 12.0–15.0)
LYMPHS ABS: 2.3 10*3/uL (ref 0.7–4.0)
Lymphocytes Relative: 30 % (ref 12–46)
MCH: 29.3 pg (ref 26.0–34.0)
MCHC: 33.6 g/dL (ref 30.0–36.0)
MCV: 87.3 fL (ref 78.0–100.0)
MONO ABS: 0.6 10*3/uL (ref 0.1–1.0)
MONOS PCT: 7 % (ref 3–12)
Neutro Abs: 4.9 10*3/uL (ref 1.7–7.7)
Neutrophils Relative %: 61 % (ref 43–77)
Platelets: 261 10*3/uL (ref 150–400)
RBC: 3.31 MIL/uL — ABNORMAL LOW (ref 3.87–5.11)
RDW: 13.7 % (ref 11.5–15.5)
WBC: 7.9 10*3/uL (ref 4.0–10.5)

## 2014-04-22 LAB — TYPE AND SCREEN
ABO/RH(D): A POS
Antibody Screen: NEGATIVE

## 2014-04-22 LAB — LIPASE, BLOOD: LIPASE: 20 U/L (ref 11–59)

## 2014-04-22 LAB — RETICULOCYTES
RBC.: 3.31 MIL/uL — ABNORMAL LOW (ref 3.87–5.11)
Retic Count, Absolute: 46.3 10*3/uL (ref 19.0–186.0)
Retic Ct Pct: 1.4 % (ref 0.4–3.1)

## 2014-04-22 LAB — GLUCOSE, CAPILLARY
Glucose-Capillary: 99 mg/dL (ref 70–99)
Glucose-Capillary: 85 mg/dL (ref 70–99)
Glucose-Capillary: 66 mg/dL — ABNORMAL LOW (ref 70–99)
Glucose-Capillary: 89 mg/dL (ref 70–99)

## 2014-04-22 LAB — ABO/RH: ABO/RH(D): A POS

## 2014-04-22 LAB — MRSA PCR SCREENING: MRSA by PCR: NEGATIVE

## 2014-04-22 LAB — PROTIME-INR
INR: 1.07 (ref 0.00–1.49)
Prothrombin Time: 14 s (ref 11.6–15.2)

## 2014-04-22 LAB — MAGNESIUM: MAGNESIUM: 1.6 mg/dL (ref 1.5–2.5)

## 2014-04-22 LAB — LACTIC ACID, PLASMA: Lactic Acid, Venous: 0.9 mmol/L (ref 0.5–2.0)

## 2014-04-22 SURGERY — EGD (ESOPHAGOGASTRODUODENOSCOPY)
Anesthesia: Monitor Anesthesia Care

## 2014-04-22 MED ORDER — BUTAMBEN-TETRACAINE-BENZOCAINE 2-2-14 % EX AERO
INHALATION_SPRAY | CUTANEOUS | Status: DC | PRN
Start: 2014-04-22 — End: 2014-04-22
  Administered 2014-04-22: 2 via TOPICAL

## 2014-04-22 MED ORDER — FENTANYL CITRATE 0.05 MG/ML IJ SOLN
INTRAMUSCULAR | Status: DC | PRN
  Administered 2014-04-22: 50 ug via INTRAVENOUS

## 2014-04-22 MED ORDER — PROPOFOL INFUSION 10 MG/ML OPTIME
INTRAVENOUS | Status: DC | PRN
  Administered 2014-04-22: 120 ug/kg/min via INTRAVENOUS

## 2014-04-22 MED ORDER — LIDOCAINE HCL (CARDIAC) 20 MG/ML IV SOLN
INTRAVENOUS | Status: DC | PRN
  Administered 2014-04-22: 100 mg via INTRAVENOUS

## 2014-04-22 MED ORDER — SODIUM CHLORIDE 0.9 % IV SOLN
8.0000 mg/h | INTRAVENOUS | Status: DC
  Administered 2014-04-22: 8 mg/h via INTRAVENOUS
  Filled 2014-04-22 (×2): qty 80

## 2014-04-22 MED ORDER — MIDAZOLAM HCL 5 MG/5ML IJ SOLN
INTRAMUSCULAR | Status: DC | PRN
  Administered 2014-04-22: 2 mg via INTRAVENOUS

## 2014-04-22 MED ORDER — SODIUM CHLORIDE 0.9 % IV SOLN: INTRAVENOUS | Status: DC

## 2014-04-22 MED ORDER — ONDANSETRON HCL 4 MG PO TABS: 4.0000 mg | ORAL_TABLET | Freq: Four times a day (QID) | ORAL | Status: DC | PRN

## 2014-04-22 MED ORDER — ALLOPURINOL 100 MG PO TABS
100.0000 mg | ORAL_TABLET | Freq: Every day | ORAL | Status: DC
Start: 2014-04-22 — End: 2014-04-23
  Administered 2014-04-22 – 2014-04-23 (×2): 100 mg via ORAL
  Filled 2014-04-22 (×2): qty 1

## 2014-04-22 MED ORDER — CETYLPYRIDINIUM CHLORIDE 0.05 % MT LIQD
7.0000 mL | Freq: Two times a day (BID) | OROMUCOSAL | Status: DC
  Administered 2014-04-22 – 2014-04-23 (×3): 7 mL via OROMUCOSAL

## 2014-04-22 MED ORDER — SODIUM CHLORIDE 0.9 % IV SOLN
INTRAVENOUS | Status: DC
  Administered 2014-04-22 – 2014-04-23 (×2): via INTRAVENOUS

## 2014-04-22 MED ORDER — INSULIN ASPART 100 UNIT/ML ~~LOC~~ SOLN: 0.0000 [IU] | Freq: Four times a day (QID) | SUBCUTANEOUS | Status: DC

## 2014-04-22 MED ORDER — LACTATED RINGERS IV SOLN
INTRAVENOUS | Status: DC
  Administered 2014-04-22: 1000 mL via INTRAVENOUS

## 2014-04-22 MED ORDER — PANTOPRAZOLE SODIUM 40 MG IV SOLR
40.0000 mg | Freq: Two times a day (BID) | INTRAVENOUS | Status: DC
  Administered 2014-04-22 (×2): 40 mg via INTRAVENOUS
  Filled 2014-04-22 (×4): qty 40

## 2014-04-22 MED ORDER — LACTATED RINGERS IV SOLN
INTRAVENOUS | Status: DC | PRN
  Administered 2014-04-22: 13:00:00 via INTRAVENOUS

## 2014-04-22 MED ORDER — ONDANSETRON HCL 4 MG/2ML IJ SOLN: 4.0000 mg | Freq: Four times a day (QID) | INTRAMUSCULAR | Status: DC | PRN

## 2014-04-22 MED ORDER — PANTOPRAZOLE SODIUM 40 MG IV SOLR: 40.0000 mg | Freq: Two times a day (BID) | INTRAVENOUS | Status: DC

## 2014-04-22 MED ORDER — PHENYLEPHRINE HCL 10 MG/ML IJ SOLN
INTRAMUSCULAR | Status: DC | PRN
  Administered 2014-04-22 (×4): 80 ug via INTRAVENOUS

## 2014-04-22 MED ORDER — SODIUM CHLORIDE 0.9 % IJ SOLN
3.0000 mL | Freq: Two times a day (BID) | INTRAMUSCULAR | Status: DC
  Administered 2014-04-22: 3 mL via INTRAVENOUS

## 2014-04-22 MED ORDER — ACETAMINOPHEN 325 MG PO TABS: 650.0000 mg | ORAL_TABLET | Freq: Four times a day (QID) | ORAL | Status: DC | PRN

## 2014-04-22 MED ORDER — ACETAMINOPHEN 650 MG RE SUPP: 650.0000 mg | Freq: Four times a day (QID) | RECTAL | Status: DC | PRN

## 2014-04-22 NOTE — Progress Notes (Deleted)
[facility] Consult: 9:17 AM 04/22/2014  LOS: 0 days    Referring Provider: Dr Posey Pronto  Primary Care Physician:  [name], MD Primary Gastroenterologist:  unassigned  Son:  Torah Pinnock [phone] cell phone.    Reason for Consultation:  Hematemesis.     HPI: Olivia Powers is a 74 y.o. female.  Residence in [place], [place].  PMH gout, htn, hld, diet controlled DM. 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  S/p cataract surgery in Jan and Feb 2016.   A few days prodrome of diminished appetite and early satiety 8 PM yesterday vomited dark, bloody material with clots.  Occurred twice.  Also had tarry, black stools  X 2.  Some weakness and likely syncope vs presyncope.  No abd pain.  No recurrences. Transported to [facility] ED.  BP nadir of 102/58, pulse apex of 134.  Hgb 12, on recheck: 10.5.  This AM it is 9.7.  MCV, platelets, coags normal.  BUN/creat 51/1.0 (26/1.2 this AM).   Transferred to [facility].  No recurrences of n/v/black stools.   No ETOH.  + Advil 200 mg about 2 x weekly for foot pain. No ASA.  No hx GIB or ulcers.  No hx liver or kidney disease.  ~ 5 yrs ago had colonoscopy with polypectomy (unknown type but "benign" and no recall letters sent). No unusual bleeding, no nose bleeds.  + solid dysphagia at level upper esophageal region.  Stable weight.     Past Medical History  Diagnosis Date  . Gout   . Hypertension   . Hypercholesteremia   . 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 Diabetes mellitus without complication     taken off Metformin by PCP this year.  . Arthritis   . Cancer     skin cancer of chest melanoma  . Alopecia     Past Surgical History  Procedure Laterality Date  . Abdominal hysterectomy    . Craniectomy for  excision of acoustic neuroma Left 1990, 1992  . Total knee arthroplasty Bilateral 1990  . Cesarean section      x2  . Cervical disc surgery    . Cataract extraction w/phaco Left 02/11/2014    Procedure: CATARACT EXTRACTION PHACO AND INTRAOCULAR LENS PLACEMENT LEFT EYE;  Surgeon: Tonny Branch, MD;  Location: [facility];  Service: Ophthalmology;  Laterality: Left;  CDE:4.93  . Cataract extraction w/phaco Right 03/11/2014    Procedure: CATARACT EXTRACTION PHACO AND INTRAOCULAR LENS PLACEMENT RIGHT EYE CDE=8.52;  Surgeon: Tonny Branch, MD;  Location: [facility];  Service: Ophthalmology;  Laterality: Right;    Prior to Admission medications   Medication Sig Start Date End Date Taking? Authorizing Provider  acetaminophen (TYLENOL) 500 MG tablet Take 500 mg by mouth every 6 (six) hours as needed for moderate pain.    Historical Provider, MD  allopurinol (ZYLOPRIM) 100 MG tablet Take 100 mg by mouth daily.    Historical Provider, MD  amLODipine-olmesartan (AZOR) 5-40 MG per tablet Take 1 tablet  by mouth daily.    Historical Provider, MD  diclofenac (VOLTAREN) 75 MG EC tablet Take 75 mg by mouth daily as needed for moderate pain.    Historical Provider, MD  DUREZOL 0.05 % EMUL Place 1 drop into the left eye 3 (three) times daily. 02/04/14   Historical Provider, MD  ibuprofen (ADVIL,MOTRIN) 200 MG tablet Take 200 mg by mouth every 6 (six) hours as needed for moderate pain.    Historical Provider, MD  PROLENSA 0.07 % SOLN Place 1 drop into the left eye daily. 02/05/14   Historical Provider, MD    Scheduled Meds: . allopurinol  100 mg Oral Daily  . insulin aspart  0-9 Units Subcutaneous 4 times per day  . [START ON 04/25/2014] pantoprazole (PROTONIX) IV  40 mg Intravenous Q12H  . sodium chloride  3 mL Intravenous Q12H   Infusions: . sodium chloride 100 mL/hr at 04/22/14 0800  . pantoprozole (PROTONIX) infusion 8 mg/hr (04/22/14 0800)   PRN Meds: acetaminophen **OR** acetaminophen, ondansetron **OR** ondansetron  (ZOFRAN) IV   Allergies as of 04/22/2014 - Review Complete 04/22/2014  Allergen Reaction Noted  . Codeine  02/07/2014    Family History Mother underwent endoscopies for stomach troubles.  No GI cancers.  No liver disease.   History   Social History  . Marital Status: Widowed    Spouse Name: N/A  . Number of Children: N/A  . Years of Education: N/A   Occupational History  . Not on file.   Social History Main Topics  . Smoking status: Never Smoker   . Smokeless tobacco: Not on file  . Alcohol Use: No  . Drug Use: No  . Sexual Activity: Yes    Birth Control/ Protection: Surgical   Other Topics Concern  . Not on file   Social History Narrative    REVIEW OF SYSTEMS: Constitutional:  No weight loss, no general decline ENT:  No nose bleeds Pulm:  No cough, no DOE CV:  No palpitations, no LE edema. No chest pain.  GU:  No hematuria, no frequency GI:  Per HPI Heme:  Per HPI   Transfusions:  none Neuro:  No headaches, no peripheral tingling or numbness.  + gait instability due to the acoustic neuroma surgery, no falls.  Derm:  No itching, no rash or sores. 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  No polyuria or dysuria Immunization:  Not queried.  Travel:  None beyond local counties in last few months.    PHYSICAL EXAM: Vital signs in last 24 hours: Filed Vitals:   04/22/14 0811  BP:   Pulse:   Temp: 97.7 F (36.5 C)  Resp:    Wt Readings from Last 3 Encounters:  04/22/14 183 lb 10.3 oz (83.3 kg)  03/11/14 180 lb (81.647 kg)    General: pleasant, elderly WF.  Comfortable, NAD.  A bit pale but o/w looks well.  Head:  No swelli  Eyes:  No icterus.  Conjunctiva slightly pale Ears:  HOH on left.    Nose:  No congestion or discharge Mouth:  Edentulous.  Moist and pink MM.  Tongue midline Neck:  No mass, no TMG, no bruits or JVD Lungs:  Clear bil.  No cough or dyspnea.  Heart: RRR.  No  MRG Abdomen:  Soft, NT, ND.  No mass or HSM.  BS active.  No bruits.   Rectal: deferred.  Dark and FOBT + per ED  doc.    Musc/Skeltl: no joint redness, swelling.  Some arthritic changes in feet.  Extremities:  No CCE  Neurologic:  Oriented x 3.  No tremor.  No limb weakness.  Fully alert and cooperative.  Diminished hearing on left Skin:  No telangectasia, sores or rash.  Alopecia.  Tattoos:  none Nodes:  No cervical adenopathy.    Psych:  Pleasant, cooperative, relaxed  Intake/Output from previous day: 03/20 0701 - 03/21 0700 In: -  Out: 300 [Urine:300] Intake/Output this shift: Total I/O In: 124.6 [I.V.:124.6] Out: -   LAB RESULTS:  Recent Labs  04/22/14 0825  WBC 7.9  HGB 9.7*  HCT 28.9*  PLT 261   BMET Lab Results  Component Value Date   NA 138 02/07/2014   K 4.7 02/07/2014   CL 107 02/07/2014   CO2 26 02/07/2014   GLUCOSE 108* 02/07/2014   BUN 29* 02/07/2014   CREATININE 1.27* 02/07/2014   CALCIUM 9.4 02/07/2014   Drugs of Abuse  No results found for: LABOPIA, COCAINSCRNUR, LABBENZ, AMPHETMU, THCU, LABBARB   RADIOLOGY STUDIES: Dg Abd Portable 2v  04/22/2014   CLINICAL DATA:  Epigastric and lower abdominal pain  EXAM: PORTABLE ABDOMEN - 2 VIEW  COMPARISON:  None.  FINDINGS: Supine and left lateral decubitus views of the abdomen are negative free intraperitoneal air. Stool and air is present throughout the colon to the rectum. There is a mildly generous volume of air throughout small bowel, more likely nonobstructive. No biliary or urinary calculi are evident.  IMPRESSION: Nonobstructive bowel gas pattern.  No free air.   Electronically Signed   By: Andreas Newport M.D.   On: 04/22/2014 06:53    ENDOSCOPIC STUDIES: Colonoscopy in [place], [place] about 2011.  Polyps removed, "benign" but type unknown.   IMPRESSION:   *  Upper GI bleed  *  ABL anemia.     *  AKI vs CKD.  No prior labs for comparison.     PLAN:     *  EGD 12:45 today.  Leave PPI  drip in place for now. BID Hgb/hct. NPO until after EGD.    Azucena Freed  04/22/2014, 9:17 AM Pager: [phone]

## 2014-04-22 NOTE — Interval H&P Note (Signed)
History and Physical Interval Note:  04/22/2014 12:41 PM  Olivia Powers  has presented today for surgery, with the diagnosis of hematemesis, anemia.  The various methods of treatment have been discussed with the patient and family. After consideration of risks, benefits and other options for treatment, the patient has consented to  Procedure(s): ESOPHAGOGASTRODUODENOSCOPY (EGD) (N/A) as a surgical intervention .  The patient's history has been reviewed, patient examined, no change in status, stable for surgery.  I have reviewed the patient's chart and labs.  Questions were answered to the patient's satisfaction.     [name]

## 2014-04-22 NOTE — Anesthesia Preprocedure Evaluation (Signed)
Anesthesia Evaluation  Patient identified by MRN, date of birth, ID band Patient awake    Reviewed: Allergy & Precautions, NPO status   History of Anesthesia Complications Negative for: history of anesthetic complications  Airway        Dental   Pulmonary neg pulmonary ROS,  breath sounds clear to auscultation        Cardiovascular hypertension, Rhythm:Regular Rate:Normal     Neuro/Psych    GI/Hepatic GI bleed   Endo/Other  diabetes  Renal/GU      Musculoskeletal  (+) Arthritis -,   Abdominal   Peds  Hematology  (+) anemia ,   Anesthesia Other Findings   Reproductive/Obstetrics                             Anesthesia Physical Anesthesia Plan  ASA: III  Anesthesia Plan: MAC   Post-op Pain Management:    Induction: Intravenous  Airway Management Planned: Natural Airway and Nasal Cannula  Additional Equipment:   Intra-op Plan:   Post-operative Plan:   Informed Consent: I have reviewed the patients History and Physical, chart, labs and discussed the procedure including the risks, benefits and alternatives for the proposed anesthesia with the patient or authorized representative who has indicated his/her understanding and acceptance.   Dental advisory given  Plan Discussed with: CRNA and Surgeon  Anesthesia Plan Comments:         Anesthesia Quick Evaluation

## 2014-04-22 NOTE — Progress Notes (Signed)
Patient admitted early this morning for reported GI bleed. Underwent endoscopy which noted no findings except for possible Mallory-Weiss tear. Seen post endoscopy. Exam unremarkable. Vital signs stable. Patient otherwise stable. Repeat labs in the morning. We'll transfer out of stepdown. Likely discharge home tomorrow.

## 2014-04-22 NOTE — Progress Notes (Signed)
Utilization Review Completed.  

## 2014-04-22 NOTE — Consult Note (Signed)
[facility] Consult: 12:26 PM 04/22/2014  LOS: 0 days    Referring Provider: Dr Posey Pronto  Primary Care Physician:  [name], MD Primary Gastroenterologist:  unassigned  Son:  Chasitty Hehl [phone] cell phone.    Reason for Consultation:  Hematemesis.     HPI: Olivia Powers is a 74 y.o. female.  Residence in [place], [place].  PMH gout, htn, hld, diet controlled DM. 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  S/p cataract surgery in Jan and Feb 2016.   A few days prodrome of diminished appetite and early satiety 8 PM yesterday vomited dark, bloody material with clots.  Occurred twice.  Also had tarry, black stools  X 2.  Some weakness and likely syncope vs presyncope.  No abd pain.  No recurrences. Transported to [facility] ED.  BP nadir of 102/58, pulse apex of 134.  Hgb 12, on recheck: 10.5.  This AM it is 9.7.  MCV, platelets, coags normal.  BUN/creat 51/1.0 (26/1.2 this AM).   Transferred to [facility].  No recurrences of n/v/black stools.   No ETOH.  + Advil 200 mg about 2 x weekly for foot pain. No ASA.  No hx GIB or ulcers.  No hx liver or kidney disease.  ~ 5 yrs ago had colonoscopy with polypectomy (unknown type but "benign" and no recall letters sent). No unusual bleeding, no nose bleeds.  + solid dysphagia at level upper esophageal region.  Stable weight.     Past Medical History  Diagnosis Date  . Gout   . Hypertension   . Hypercholesteremia   . 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 Diabetes mellitus without complication     taken off Metformin by PCP this year.  . Arthritis   . Cancer     skin cancer of chest melanoma  . Alopecia     Past Surgical History  Procedure Laterality Date  . Abdominal hysterectomy    . Craniectomy for  excision of acoustic neuroma Left 1990, 1992  . Total knee arthroplasty Bilateral 1990  . Cesarean section      x2  . Cervical disc surgery    . Cataract extraction w/phaco Left 02/11/2014    Procedure: CATARACT EXTRACTION PHACO AND INTRAOCULAR LENS PLACEMENT LEFT EYE;  Surgeon: Tonny Branch, MD;  Location: [facility];  Service: Ophthalmology;  Laterality: Left;  CDE:4.93  . Cataract extraction w/phaco Right 03/11/2014    Procedure: CATARACT EXTRACTION PHACO AND INTRAOCULAR LENS PLACEMENT RIGHT EYE CDE=8.52;  Surgeon: Tonny Branch, MD;  Location: [facility];  Service: Ophthalmology;  Laterality: Right;    Prior to Admission medications   Medication Sig Start Date End Date Taking? Authorizing Provider  acetaminophen (TYLENOL) 500 MG tablet Take 500 mg by mouth every 6 (six) hours as needed for moderate pain.    Historical Provider, MD  allopurinol (ZYLOPRIM) 100 MG tablet Take 100 mg by mouth daily.    Historical Provider, MD  amLODipine-olmesartan (AZOR) 5-40 MG per tablet Take 1 tablet  by mouth daily.    Historical Provider, MD  diclofenac (VOLTAREN) 75 MG EC tablet Take 75 mg by mouth daily as needed for moderate pain.    Historical Provider, MD  DUREZOL 0.05 % EMUL Place 1 drop into the left eye 3 (three) times daily. 02/04/14   Historical Provider, MD  ibuprofen (ADVIL,MOTRIN) 200 MG tablet Take 200 mg by mouth every 6 (six) hours as needed for moderate pain.    Historical Provider, MD  PROLENSA 0.07 % SOLN Place 1 drop into the left eye daily. 02/05/14   Historical Provider, MD    Scheduled Meds: . allopurinol  100 mg Oral Daily  . antiseptic oral rinse  7 mL Mouth Rinse BID  . insulin aspart  0-9 Units Subcutaneous 4 times per day  . [START ON 04/25/2014] pantoprazole (PROTONIX) IV  40 mg Intravenous Q12H  . sodium chloride  3 mL Intravenous Q12H   Infusions: . sodium chloride Stopped (04/22/14 1158)  . sodium chloride    . lactated ringers 1,000 mL (04/22/14 1216)  . pantoprozole (PROTONIX)  infusion Stopped (04/22/14 1158)   PRN Meds: acetaminophen **OR** acetaminophen, ondansetron **OR** ondansetron (ZOFRAN) IV   Allergies as of 04/22/2014 - Review Complete 04/22/2014  Allergen Reaction Noted  . Codeine  02/07/2014    Family History Mother underwent endoscopies for stomach troubles.  No GI cancers.  No liver disease.   History   Social History  . Marital Status: Widowed    Spouse Name: N/A  . Number of Children: N/A  . Years of Education: N/A   Occupational History  . Not on file.   Social History Main Topics  . Smoking status: Never Smoker   . Smokeless tobacco: Not on file  . Alcohol Use: No  . Drug Use: No  . Sexual Activity: Yes    Birth Control/ Protection: Surgical   Other Topics Concern  . Not on file   Social History Narrative    REVIEW OF SYSTEMS: Constitutional:  No weight loss, no general decline ENT:  No nose bleeds Pulm:  No cough, no DOE CV:  No palpitations, no LE edema. No chest pain.  GU:  No hematuria, no frequency GI:  Per HPI Heme:  Per HPI   Transfusions:  none Neuro:  No headaches, no peripheral tingling or numbness.  + gait instability due to the acoustic neuroma surgery, no falls.  Derm:  No itching, no rash or sores. 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  No polyuria or dysuria Immunization:  Not queried.  Travel:  None beyond local counties in last few months.    PHYSICAL EXAM: Vital signs in last 24 hours: Filed Vitals:   04/22/14 1216  BP: 123/43  Pulse:   Temp:   Resp:    Wt Readings from Last 3 Encounters:  04/22/14 183 lb 10.3 oz (83.3 kg)  03/11/14 180 lb (81.647 kg)    General: pleasant, elderly WF.  Comfortable, NAD.  A bit pale but o/w looks well.  Head:  No swelli  Eyes:  No icterus.  Conjunctiva slightly pale Ears:  HOH on left.    Nose:  No congestion or discharge Mouth:  Edentulous.  Moist and pink MM.  Tongue  midline Neck:  No mass, no TMG, no bruits or JVD Lungs:  Clear bil.  No cough or dyspnea.  Heart: RRR.  No MRG Abdomen:  Soft, NT, ND.  No mass  or HSM.  BS active.  No bruits.   Rectal: deferred.  Dark and FOBT + per ED doc.    Musc/Skeltl: no joint redness, swelling.  Some arthritic changes in feet.  Extremities:  No CCE  Neurologic:  Oriented x 3.  No tremor.  No limb weakness.  Fully alert and cooperative.  Diminished hearing on left Skin:  No telangectasia, sores or rash.  Alopecia.  Tattoos:  none Nodes:  No cervical adenopathy.    Psych:  Pleasant, cooperative, relaxed  Intake/Output from previous day: 03/20 0701 - 03/21 0700 In: -  Out: 300 [Urine:300] Intake/Output this shift: Total I/O In: 623.4 [I.V.:623.4] Out: 400 [Urine:400]  LAB RESULTS:  Recent Labs  04/22/14 0825  WBC 7.9  HGB 9.7*  HCT 28.9*  PLT 261   BMET Lab Results  Component Value Date   NA 142 04/22/2014   NA 138 02/07/2014   K 4.5 04/22/2014   K 4.7 02/07/2014   CL 115* 04/22/2014   CL 107 02/07/2014   CO2 22 04/22/2014   CO2 26 02/07/2014   GLUCOSE 100* 04/22/2014   GLUCOSE 108* 02/07/2014   BUN 54* 04/22/2014   BUN 29* 02/07/2014   CREATININE 1.01 04/22/2014   CREATININE 1.27* 02/07/2014   CALCIUM 8.9 04/22/2014   CALCIUM 9.4 02/07/2014   Drugs of Abuse  No results found for: LABOPIA, COCAINSCRNUR, LABBENZ, AMPHETMU, THCU, LABBARB   RADIOLOGY STUDIES: Dg Abd Portable 2v  04/22/2014   CLINICAL DATA:  Epigastric and lower abdominal pain  EXAM: PORTABLE ABDOMEN - 2 VIEW  COMPARISON:  None.  FINDINGS: Supine and left lateral decubitus views of the abdomen are negative free intraperitoneal air. Stool and air is present throughout the colon to the rectum. There is a mildly generous volume of air throughout small bowel, more likely nonobstructive. No biliary or urinary calculi are evident.  IMPRESSION: Nonobstructive bowel gas pattern.  No free air.   Electronically Signed   By: Andreas Newport M.D.   On: 04/22/2014 06:53    ENDOSCOPIC STUDIES: Colonoscopy in [place], [place] about 2011.  Polyps removed, "benign" but type unknown.   IMPRESSION:   *  Upper GI bleed  *  ABL anemia.     *  AKI vs CKD.  No prior labs for comparison.     PLAN:     *  EGD 12:45 today.  Leave PPI drip in place for now. BID Hgb/hct. NPO until after EGD.    Azucena Freed  04/22/2014, 12:26 PM Pager: [phone]  ________________________________________________________________________  Velora Heckler GI MD note:  I personally examined the patient, reviewed the data and agree with the assessment and plan described above.  Likely UGI bleed. Will proceed with EGD today.   Owens Loffler, MD [facility] Pager [phone]

## 2014-04-22 NOTE — H&P (Signed)
[facility] History and Physical  Patient: Olivia Powers  MRN: [id number]  DOB: August 01, 1940  DOS: the patient was seen and examined on 04/22/2014 PCP: [name], MD  Chief Complaint: Coffee color emesis  HPI: Olivia Powers is a 74 y.o. female with Past medical history of gout, hypertension, dyslipidemia, diet-controlled diabetes. The patient is presenting with complaints of blood in the vomit. Patient mentions that throughout the day she continues to have persistent nausea, and in the afternoon she started having complaints of some epigastric pain which felt like a dull ache and then she started having vomiting. Her first vomit was dark maroon color and her second vomit was coffee color. She had later on one bowel movement which was black in color and lose. Due to these she went to the ER. In the ER she had another bowel movement which was black in color. And then she continues to have epigastric pain and nausea but no further vomiting or bowel movement. Initially on presentation she was tachycardic and hypertensive and she received IV fluid bolus. Initial hemoglobin was 11.6 and repeat was 10.5. INR was 1 and a PTT was normal. Her platelets were 300+. She does not use any blood. She does not use any aspirin she does not use any ibuprofen and over last few days. She denies any fever or chills she denies any foreign that she does not eat normally basis. She denies any sick contact or any travel outside of status. She denies any prior episodes of GI bleeding. She is not alcoholic. No new rash she has an older rash for which she is applying cream on her left leg. She has some chronic leg swelling but no leg tenderness. She does not complain of chronic heartburn and only has occasional acid reflux.  The patient is coming from home. And at her baseline independent for most of her ADL.  Review of Systems: as mentioned in the history of present illness.  A Comprehensive review of the  other systems is negative.  Past Medical History  Diagnosis Date  . Gout   . Hypertension   . Hypercholesteremia   . 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 Diabetes mellitus without complication     taken off Metformin by PCP this year.  . Arthritis   . Cancer     skin cancer of chest melanoma   Past Surgical History  Procedure Laterality Date  . Abdominal hysterectomy    . Brain tumor excision    . Total knee arthroplasty Bilateral   . Cesarean section      x2  . Cervical disc surgery    . Cataract extraction w/phaco Left 02/11/2014    Procedure: CATARACT EXTRACTION PHACO AND INTRAOCULAR LENS PLACEMENT LEFT EYE;  Surgeon: Tonny Branch, MD;  Location: [facility];  Service: Ophthalmology;  Laterality: Left;  CDE:4.93  . Cataract extraction w/phaco Right 03/11/2014    Procedure: CATARACT EXTRACTION PHACO AND INTRAOCULAR LENS PLACEMENT RIGHT EYE CDE=8.52;  Surgeon: Tonny Branch, MD;  Location: [facility];  Service: Ophthalmology;  Laterality: Right;   Social History:  reports that she has never smoked. She does not have any smokeless tobacco history on file. She reports that she does not drink alcohol or use illicit drugs.  Allergies  Allergen Reactions  . Codeine     GI upset nausea    No family history on file.  Prior to Admission medications   Medication Sig Start Date End  Date Taking? Authorizing Provider  acetaminophen (TYLENOL) 500 MG tablet Take 500 mg by mouth every 6 (six) hours as needed for moderate pain.    Historical Provider, MD  allopurinol (ZYLOPRIM) 100 MG tablet Take 100 mg by mouth daily.    Historical Provider, MD  amLODipine-olmesartan (AZOR) 5-40 MG per tablet Take 1 tablet by mouth daily.    Historical Provider, MD  diclofenac (VOLTAREN) 75 MG EC tablet Take 75 mg by mouth daily as needed for moderate pain.    Historical Provider, MD  DUREZOL 0.05 % EMUL Place 1 drop into the left eye 3 (three) times daily. 02/04/14   Historical Provider, MD   ibuprofen (ADVIL,MOTRIN) 200 MG tablet Take 200 mg by mouth every 6 (six) hours as needed for moderate pain.    Historical Provider, MD  PROLENSA 0.07 % SOLN Place 1 drop into the left eye daily. 02/05/14   Historical Provider, MD    Physical Exam: There were no vitals filed for this visit.  General: Alert, Awake and Oriented to Time, Place and Person. Appear in mild distress Eyes: PERRL ENT: Oral Mucosa clear moist. Neck: no JVD Cardiovascular: S1 and S2 Present, no Murmur, Peripheral Pulses Present Respiratory: Bilateral Air entry equal and Decreased, Clear to Auscultation, noCrackles, no wheezes Abdomen: Bowel Sound present, Soft and epigastric tender Skin: Diffuse small macules on the left leg Rash Extremities: Trace Pedal edema, no calf tenderness Neurologic: Grossly no focal neuro deficit.  Labs on Admission:  CBC: No results for input(s): WBC, NEUTROABS, HGB, HCT, MCV, PLT in the last 168 hours.  CMP     Component Value Date/Time   NA 138 02/07/2014 0930   K 4.7 02/07/2014 0930   CL 107 02/07/2014 0930   CO2 26 02/07/2014 0930   GLUCOSE 108* 02/07/2014 0930   BUN 29* 02/07/2014 0930   CREATININE 1.27* 02/07/2014 0930   CALCIUM 9.4 02/07/2014 0930   GFRNONAA 40* 02/07/2014 0930   GFRAA 46* 02/07/2014 0930    No results for input(s): LIPASE, AMYLASE in the last 168 hours.  No results for input(s): CKTOTAL, CKMB, CKMBINDEX, TROPONINI in the last 168 hours. BNP (last 3 results) No results for input(s): BNP in the last 8760 hours.  ProBNP (last 3 results) No results for input(s): PROBNP in the last 8760 hours.   Radiological Exams on Admission: No results found.  Assessment/Plan Principal Problem:   Acute upper GI bleed Active Problems:   Gout   Hypertension   Diabetes mellitus without complication   1. Acute upper GI bleed The patient is presenting with complaints of nausea vomiting and epigastric pain as well as coffee color emesis and  melena. Consistent with upper GI bleed. H&H has dropped significantly. We will recheck her H&H. GI has been consulted and will be following up with the patient. Patient is a 68 Protonix infusion. Continue IV hydration. Patient remains nothing by mouth except medications. Type and screen and transfuse when necessary for hemoglobin less than 7.  2. Gout. Continue allopurinol.  3. Hypertension. Holding blood pressure medication blood pressure at present stable.  4. History of diabetes mellitus currently diet controlled. Check hemoglobin A1c and place on sliding scale.  Advance goals of care discussion: Full code   Consults: Gastroenterology, Dr. Deatra Ina  DVT Prophylaxis: mechanical compression device. Nutrition: Nothing by mouth medications  Family Communication: Son was present at bedside, opportunity was given to ask question and all questions were answered satisfactorily at the time of interview. Disposition: Admitted to inpatient  in step-down unit.  Author: Berle Mull, MD [facility] Pager: [phone] 04/22/2014, 6:31 AM    If 7PM-7AM, please contact night-coverage [email or web address] Password [email or web address]

## 2014-04-22 NOTE — Progress Notes (Signed)
Patient arrived to room 9 via bed from [facility]. Report received from [place] on [place]. Patient transferred to bed, SR up, call bell in reach, bed alarm set. Patient alert, oriented. Oriented to room, call bell, bed controls and orders with verbal understanding. Denies pain or problems, IVF infusing to [place] without problems, site clear, O2 in use. Tele box #13 applied. Will monitor.

## 2014-04-22 NOTE — H&P (View-Only) (Signed)
[facility] Consult: 12:26 PM 04/22/2014  LOS: 0 days    Referring Provider: Dr Posey Pronto  Primary Care Physician:  [name], MD Primary Gastroenterologist:  unassigned  Son:  Paradise Vensel [phone] cell phone.    Reason for Consultation:  Hematemesis.     HPI: Olivia Powers is a 74 y.o. female.  Residence in [place], [place].  PMH gout, htn, hld, diet controlled DM. 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  S/p cataract surgery in Jan and Feb 2016.   A few days prodrome of diminished appetite and early satiety 8 PM yesterday vomited dark, bloody material with clots.  Occurred twice.  Also had tarry, black stools  X 2.  Some weakness and likely syncope vs presyncope.  No abd pain.  No recurrences. Transported to [facility] ED.  BP nadir of 102/58, pulse apex of 134.  Hgb 12, on recheck: 10.5.  This AM it is 9.7.  MCV, platelets, coags normal.  BUN/creat 51/1.0 (26/1.2 this AM).   Transferred to [facility].  No recurrences of n/v/black stools.   No ETOH.  + Advil 200 mg about 2 x weekly for foot pain. No ASA.  No hx GIB or ulcers.  No hx liver or kidney disease.  ~ 5 yrs ago had colonoscopy with polypectomy (unknown type but "benign" and no recall letters sent). No unusual bleeding, no nose bleeds.  + solid dysphagia at level upper esophageal region.  Stable weight.     Past Medical History  Diagnosis Date  . Gout   . Hypertension   . Hypercholesteremia   . 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 Diabetes mellitus without complication     taken off Metformin by PCP this year.  . Arthritis   . Cancer     skin cancer of chest melanoma  . Alopecia     Past Surgical History  Procedure Laterality Date  . Abdominal hysterectomy    . Craniectomy for  excision of acoustic neuroma Left 1990, 1992  . Total knee arthroplasty Bilateral 1990  . Cesarean section      x2  . Cervical disc surgery    . Cataract extraction w/phaco Left 02/11/2014    Procedure: CATARACT EXTRACTION PHACO AND INTRAOCULAR LENS PLACEMENT LEFT EYE;  Surgeon: Tonny Branch, MD;  Location: [facility];  Service: Ophthalmology;  Laterality: Left;  CDE:4.93  . Cataract extraction w/phaco Right 03/11/2014    Procedure: CATARACT EXTRACTION PHACO AND INTRAOCULAR LENS PLACEMENT RIGHT EYE CDE=8.52;  Surgeon: Tonny Branch, MD;  Location: [facility];  Service: Ophthalmology;  Laterality: Right;    Prior to Admission medications   Medication Sig Start Date End Date Taking? Authorizing Provider  acetaminophen (TYLENOL) 500 MG tablet Take 500 mg by mouth every 6 (six) hours as needed for moderate pain.    Historical Provider, MD  allopurinol (ZYLOPRIM) 100 MG tablet Take 100 mg by mouth daily.    Historical Provider, MD  amLODipine-olmesartan (AZOR) 5-40 MG per tablet Take 1 tablet  by mouth daily.    Historical Provider, MD  diclofenac (VOLTAREN) 75 MG EC tablet Take 75 mg by mouth daily as needed for moderate pain.    Historical Provider, MD  DUREZOL 0.05 % EMUL Place 1 drop into the left eye 3 (three) times daily. 02/04/14   Historical Provider, MD  ibuprofen (ADVIL,MOTRIN) 200 MG tablet Take 200 mg by mouth every 6 (six) hours as needed for moderate pain.    Historical Provider, MD  PROLENSA 0.07 % SOLN Place 1 drop into the left eye daily. 02/05/14   Historical Provider, MD    Scheduled Meds: . allopurinol  100 mg Oral Daily  . antiseptic oral rinse  7 mL Mouth Rinse BID  . insulin aspart  0-9 Units Subcutaneous 4 times per day  . [START ON 04/25/2014] pantoprazole (PROTONIX) IV  40 mg Intravenous Q12H  . sodium chloride  3 mL Intravenous Q12H   Infusions: . sodium chloride Stopped (04/22/14 1158)  . sodium chloride    . lactated ringers 1,000 mL (04/22/14 1216)  . pantoprozole (PROTONIX)  infusion Stopped (04/22/14 1158)   PRN Meds: acetaminophen **OR** acetaminophen, ondansetron **OR** ondansetron (ZOFRAN) IV   Allergies as of 04/22/2014 - Review Complete 04/22/2014  Allergen Reaction Noted  . Codeine  02/07/2014    Family History Mother underwent endoscopies for stomach troubles.  No GI cancers.  No liver disease.   History   Social History  . Marital Status: Widowed    Spouse Name: N/A  . Number of Children: N/A  . Years of Education: N/A   Occupational History  . Not on file.   Social History Main Topics  . Smoking status: Never Smoker   . Smokeless tobacco: Not on file  . Alcohol Use: No  . Drug Use: No  . Sexual Activity: Yes    Birth Control/ Protection: Surgical   Other Topics Concern  . Not on file   Social History Narrative    REVIEW OF SYSTEMS: Constitutional:  No weight loss, no general decline ENT:  No nose bleeds Pulm:  No cough, no DOE CV:  No palpitations, no LE edema. No chest pain.  GU:  No hematuria, no frequency GI:  Per HPI Heme:  Per HPI   Transfusions:  none Neuro:  No headaches, no peripheral tingling or numbness.  + gait instability due to the acoustic neuroma surgery, no falls.  Derm:  No itching, no rash or sores. 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  No polyuria or dysuria Immunization:  Not queried.  Travel:  None beyond local counties in last few months.    PHYSICAL EXAM: Vital signs in last 24 hours: Filed Vitals:   04/22/14 1216  BP: 123/43  Pulse:   Temp:   Resp:    Wt Readings from Last 3 Encounters:  04/22/14 183 lb 10.3 oz (83.3 kg)  03/11/14 180 lb (81.647 kg)    General: pleasant, elderly WF.  Comfortable, NAD.  A bit pale but o/w looks well.  Head:  No swelli  Eyes:  No icterus.  Conjunctiva slightly pale Ears:  HOH on left.    Nose:  No congestion or discharge Mouth:  Edentulous.  Moist and pink MM.  Tongue  midline Neck:  No mass, no TMG, no bruits or JVD Lungs:  Clear bil.  No cough or dyspnea.  Heart: RRR.  No MRG Abdomen:  Soft, NT, ND.  No mass  or HSM.  BS active.  No bruits.   Rectal: deferred.  Dark and FOBT + per ED doc.    Musc/Skeltl: no joint redness, swelling.  Some arthritic changes in feet.  Extremities:  No CCE  Neurologic:  Oriented x 3.  No tremor.  No limb weakness.  Fully alert and cooperative.  Diminished hearing on left Skin:  No telangectasia, sores or rash.  Alopecia.  Tattoos:  none Nodes:  No cervical adenopathy.    Psych:  Pleasant, cooperative, relaxed  Intake/Output from previous day: 03/20 0701 - 03/21 0700 In: -  Out: 300 [Urine:300] Intake/Output this shift: Total I/O In: 623.4 [I.V.:623.4] Out: 400 [Urine:400]  LAB RESULTS:  Recent Labs  04/22/14 0825  WBC 7.9  HGB 9.7*  HCT 28.9*  PLT 261   BMET Lab Results  Component Value Date   NA 142 04/22/2014   NA 138 02/07/2014   K 4.5 04/22/2014   K 4.7 02/07/2014   CL 115* 04/22/2014   CL 107 02/07/2014   CO2 22 04/22/2014   CO2 26 02/07/2014   GLUCOSE 100* 04/22/2014   GLUCOSE 108* 02/07/2014   BUN 54* 04/22/2014   BUN 29* 02/07/2014   CREATININE 1.01 04/22/2014   CREATININE 1.27* 02/07/2014   CALCIUM 8.9 04/22/2014   CALCIUM 9.4 02/07/2014   Drugs of Abuse  No results found for: LABOPIA, COCAINSCRNUR, LABBENZ, AMPHETMU, THCU, LABBARB   RADIOLOGY STUDIES: Dg Abd Portable 2v  04/22/2014   CLINICAL DATA:  Epigastric and lower abdominal pain  EXAM: PORTABLE ABDOMEN - 2 VIEW  COMPARISON:  None.  FINDINGS: Supine and left lateral decubitus views of the abdomen are negative free intraperitoneal air. Stool and air is present throughout the colon to the rectum. There is a mildly generous volume of air throughout small bowel, more likely nonobstructive. No biliary or urinary calculi are evident.  IMPRESSION: Nonobstructive bowel gas pattern.  No free air.   Electronically Signed   By: Andreas Newport M.D.   On: 04/22/2014 06:53    ENDOSCOPIC STUDIES: Colonoscopy in [place], [place] about 2011.  Polyps removed, "benign" but type unknown.   IMPRESSION:   *  Upper GI bleed  *  ABL anemia.     *  AKI vs CKD.  No prior labs for comparison.     PLAN:     *  EGD 12:45 today.  Leave PPI drip in place for now. BID Hgb/hct. NPO until after EGD.    Azucena Freed  04/22/2014, 12:26 PM Pager: [phone]  ________________________________________________________________________  Velora Heckler GI MD note:  I personally examined the patient, reviewed the data and agree with the assessment and plan described above.  Likely UGI bleed. Will proceed with EGD today.   Owens Loffler, MD [facility] Pager [phone]

## 2014-04-22 NOTE — Transfer of Care (Signed)
Immediate Anesthesia Transfer of Care Note  Patient: Olivia Powers  Procedure(s) Performed: Procedure(s): ESOPHAGOGASTRODUODENOSCOPY (EGD) (N/A)  Patient Location: Endoscopy Unit  Anesthesia Type:MAC  Level of Consciousness: awake, alert , oriented and patient cooperative  Airway & Oxygen Therapy: Patient Spontanous Breathing and Patient connected to nasal cannula oxygen  Post-op Assessment: Report given to RN and Post -op Vital signs reviewed and stable  Post vital signs: Reviewed and stable  Last Vitals:  Filed Vitals:   04/22/14 1331  BP: 113/44  Pulse:   Temp:   Resp:     Complications: No apparent anesthesia complications

## 2014-04-22 NOTE — Op Note (Signed)
[facility] [place], [address]   ENDOSCOPY PROCEDURE REPORT  PATIENT: Olivia Powers, Olivia Powers  MR#: [id number] BIRTHDATE: May 19, 1940 , 60  yrs. old GENDER: female ENDOSCOPIST: [name], MD PROCEDURE DATE:  04/22/2014 PROCEDURE:  EGD w/ biopsy ASA CLASS:     Class III INDICATIONS:  abd pain, nausea, melena, hematemesis, anemia with elevated BUN/Cr. MEDICATIONS: Monitored anesthesia care TOPICAL ANESTHETIC: none  DESCRIPTION OF PROCEDURE: After the risks benefits and alternatives of the procedure were thoroughly explained, informed consent was obtained.  The Pentax Gastroscope [id number] endoscope was introduced through the mouth and advanced to the proximal jejunum , Without limitations.  The instrument was slowly withdrawn as the mucosa was fully examined.  There was no recent or old blood in the GI tract.  There was a 3cm hiatal hernia without Cameron's type erosions.  There was mild, non-specific distal gastritis (3-4 small erosions), biopsies were taken and sent to pathology.  There was a short (15mm) linear erosion at GE junction which may represent a healing Mallory Weis tear.  The examination to the proximal jejunum was otherwise normal.  Retroflexed views revealed no abnormalities.     The scope was then withdrawn from the patient and the procedure completed.  COMPLICATIONS: There were no immediate complications.  ENDOSCOPIC IMPRESSION: There was no recent or old blood in the GI tract.  There was a 3cm hiatal hernia without Cameron's type erosions.  There was mild, non-specific distal gastritis (3-4 small erosions), biopsies were taken and sent to pathology.  There was a short (39mm) linear erosion at GE junction which may represent a healing Mallory Weis tear.  The examinationto the proximal jejunum was otherwise normal   RECOMMENDATIONS: No clear etiology for what really seems to have been an UGI bleed (nausea, melenic stools,  vomiting dark material, BUN/Cr very elevated).  Only potential site was a possible (healing) Mallory Weis tear at [employer].  Will change to IV PPI twice daily and observe her clinically for rebleeding.  If obvious, overt bleeding again would consider nuc med scan vs. repeat EGD.  My suspicion for lower GI source is very low and she had a colonoscopy about 5 years that I don't think it needs to be repeated now.   eSigned:  [name], MD 04/22/2014 1:23 PM

## 2014-04-22 NOTE — Anesthesia Postprocedure Evaluation (Signed)
  Anesthesia Post-op Note  Patient: Olivia Powers  Procedure(s) Performed: Procedure(s): ESOPHAGOGASTRODUODENOSCOPY (EGD) (N/A)  Patient Location: Endoscopy Unit  Anesthesia Type:MAC  Level of Consciousness: awake and alert   Airway and Oxygen Therapy: Patient Spontanous Breathing  Post-op Pain: none  Post-op Assessment: Post-op Vital signs reviewed  Post-op Vital Signs: stable  Last Vitals:  Filed Vitals:   04/22/14 1331  BP: 113/44  Pulse:   Temp:   Resp:     Complications: No apparent anesthesia complications

## 2014-04-23 ENCOUNTER — Encounter (HOSPITAL_COMMUNITY): Payer: Self-pay | Admitting: Gastroenterology

## 2014-04-23 LAB — BRAIN NATRIURETIC PEPTIDE: B NATRIURETIC PEPTIDE 5: 16.4 pg/mL (ref 0.0–100.0)

## 2014-04-23 LAB — BASIC METABOLIC PANEL
Anion gap: 7 (ref 5–15)
BUN: 34 mg/dL — ABNORMAL HIGH (ref 6–23)
CHLORIDE: 111 mmol/L (ref 96–112)
CO2: 23 mmol/L (ref 19–32)
Calcium: 8.6 mg/dL (ref 8.4–10.5)
Creatinine, Ser: 1.09 mg/dL (ref 0.50–1.10)
GFR, EST AFRICAN AMERICAN: 56 mL/min — AB (ref >90)
GFR, EST NON AFRICAN AMERICAN: 48 mL/min — AB (ref >90)
GLUCOSE: 98 mg/dL (ref 70–99)
Potassium: 4.3 mmol/L (ref 3.5–5.1)
Sodium: 141 mmol/L (ref 135–145)

## 2014-04-23 LAB — GLUCOSE, CAPILLARY
GLUCOSE-CAPILLARY: 79 mg/dL (ref 70–99)
GLUCOSE-CAPILLARY: 99 mg/dL (ref 70–99)
Glucose-Capillary: 74 mg/dL (ref 70–99)
Glucose-Capillary: 87 mg/dL (ref 70–99)
Glucose-Capillary: 88 mg/dL (ref 70–99)

## 2014-04-23 LAB — HEMOGLOBIN AND HEMATOCRIT, BLOOD
HCT: 26 % — ABNORMAL LOW (ref 36.0–46.0)
HCT: 27.1 % — ABNORMAL LOW (ref 36.0–46.0)
HEMOGLOBIN: 8.6 g/dL — AB (ref 12.0–15.0)
HEMOGLOBIN: 8.7 g/dL — AB (ref 12.0–15.0)

## 2014-04-23 LAB — HEMOGLOBIN A1C
Hgb A1c MFr Bld: 6.2 % — ABNORMAL HIGH (ref 4.8–5.6)
Mean Plasma Glucose: 131 mg/dL

## 2014-04-23 MED ORDER — SODIUM CHLORIDE 0.9 % IV BOLUS (SEPSIS)
500.0000 mL | Freq: Once | INTRAVENOUS | Status: AC
  Administered 2014-04-23: 500 mL via INTRAVENOUS

## 2014-04-23 MED ORDER — OMEPRAZOLE 20 MG PO CPDR: 20.0000 mg | DELAYED_RELEASE_CAPSULE | Freq: Two times a day (BID) | ORAL | Status: DC

## 2014-04-23 MED ORDER — PANTOPRAZOLE SODIUM 40 MG PO TBEC
40.0000 mg | DELAYED_RELEASE_TABLET | Freq: Two times a day (BID) | ORAL | Status: DC
  Administered 2014-04-23 (×2): 40 mg via ORAL

## 2014-04-23 NOTE — Progress Notes (Signed)
MD paged to notify of low BP.

## 2014-04-23 NOTE — Progress Notes (Signed)
[facility] Progress Note    Since last GI note: EGD yesterday, full report in chart.  No BM since admit 24 hours ago.  No vomiting.  No abd pains. Still a bit nauseas  Objective: Vital signs in last 24 hours: Temp:  [97.3 F (36.3 C)-98.6 F (37 C)] 97.7 F (36.5 C) (03/22 0554) Pulse Rate:  [84-98] 84 (03/21 2154) Resp:  [14-19] 18 (03/22 0554) BP: (79-123)/(36-54) 87/54 mmHg (03/22 0554) SpO2:  [100 %] 100 % (03/22 0554) Weight:  [178 lb 4.8 oz (80.876 kg)-181 lb 11.2 oz (82.419 kg)] 178 lb 4.8 oz (80.876 kg) (03/22 0337) Last BM Date: 04/21/14 General: alert and oriented times 3 Heart: regular rate and rythm Abdomen: soft, non-tender, non-distended, normal bowel sounds   Lab Results:  Recent Labs  04/22/14 0825 04/22/14 1828 04/23/14 0606  WBC 7.9  --   --   HGB 9.7* 9.2* 8.6*  PLT 261  --   --   MCV 87.3  --   --     Recent Labs  04/22/14 0825  NA 142  K 4.5  CL 115*  CO2 22  GLUCOSE 100*  BUN 54*  CREATININE 1.01  CALCIUM 8.9    Recent Labs  04/22/14 0825  PROT 5.1*  ALBUMIN 3.2*  AST 14  ALT 10  ALKPHOS 48  BILITOT 0.4    Recent Labs  04/22/14 0825  INR 1.07    Medications: Scheduled Meds: . allopurinol  100 mg Oral Daily  . antiseptic oral rinse  7 mL Mouth Rinse BID  . insulin aspart  0-9 Units Subcutaneous 4 times per day  . pantoprazole (PROTONIX) IV  40 mg Intravenous Q12H  . sodium chloride  3 mL Intravenous Q12H   Continuous Infusions: . sodium chloride 50 mL/hr at 04/23/14 0035   PRN Meds:.acetaminophen **OR** acetaminophen, ondansetron **OR** ondansetron (ZOFRAN) IV    Assessment/Plan: 74 y.o. female with gi bleeding  UGI bleeding (hematemesis, melena, elevated BUN/Cr ratio, in setting of nausea recently) however no CLEAR source of her bleeding on yesterday's EGD. Perhaps the MW-like site at [place] junction was truly the bleeding site.  Perhaps somewhere else such as a small AVM, dieulafoy type lesion.  Will  change to PO PPI, advance diet to solids today.  She should continue twice daily for 1 month and then OK to decrease to once daily PPI.  If no recurrent bleeding she is safe to d/c home late afternoon today or in AM tomorrow.  I offered out patient GI follow up however she lives quite a ways from here, was transferred from hospital in [place] and plans to seek care closer to home if needed.    [name], MD  04/23/2014, 8:05 AM [facility] Pager [phone]

## 2014-04-23 NOTE — Discharge Summary (Signed)
Discharge Summary  Olivia Powers LOV:[id number] DOB: [birth year]  PCP: Olivia Blitz, MD  Admit date: 04/22/2014 Discharge date: 04/23/2014  Time spent: 25 minutes  Recommendations for Outpatient Follow-up:  1. New medication: Prilosec 20 by mouth twice a day 2. Patient will follow-up with her primary care physician, Dr. Manuella Powers in the next 2-3 weeks.  Discharge Diagnoses:  Active Hospital Problems   Diagnosis Date Noted  . Acute upper GI bleed 04/22/2014  . Gout   . Hypertension   . Diabetes mellitus without complication     Resolved Hospital Problems   Diagnosis Date Noted Date Resolved  No resolved problems to display.    Discharge Condition: Improved, being discharged home  Diet recommendation: Carb modified low sodium  Filed Weights   04/22/14 0500 04/22/14 2154 04/23/14 0337  Weight: 83.3 kg (183 lb 10.3 oz) 82.419 kg (181 lb 11.2 oz) 80.876 kg (178 lb 4.8 oz)    History of present illness:  74 year old female past oral history of hypertension, diabetes and gout admitted in the early morning of 3/21 after coming in complaining of epigastric pain and nausea with some coffee-colored emesis and reportedly one black bowel movement. Initial hemoglobin was 11.6 repeat was 10.5. Patient does not use aspirin or Motrin and was on the hospitalist service.  Hospital Course:  Principal Problem:   Acute upper GI bleed: Seen by gastroenterology and underwent EGD which noted no clear source of bleeding. It was possible she had a small AVM lesion not visualized. Started on twice a day PPI. Follow-up labs on day of discharge were relatively unremarkable. Her blood pressure was slightly soft so patient given fluid bolus and pressures improved. Follow-up lab work unremarkable and patient pressure stabilized with no drop in hemoglobin so felt to be medically stable and discharged home. She'll follow-up with her PCP in the next few weeks  Active Problems:   Gout   Hypertension: Stable resume  antihypertensives on discharge   Diabetes mellitus without complication: Stable resume anti-hyperglycemics on discharge   Procedures:  Status post EGD done 3/21: Noting no recent or old blood and GI tract although noting some nonspecific distal gastritis and a short 2 mm linear erosion at the GE junction possibly Mallory-Weiss tear that is healing  Consultations:  GI  Discharge Exam: BP 115/51 mmHg  Pulse 100  Temp(Src) 97.5 F (36.4 C) (Oral)  Resp 18  Ht 4\' 11"  (1.499 m)  Wt 80.876 kg (178 lb 4.8 oz)  BMI 35.99 kg/m2  SpO2 98%  General: Alert and oriented 3, no acute distress Cardiovascular: Regular rate and rhythm, S1-S2 Respiratory: Clear to auscultation bilaterally  Discharge Instructions You were cared for by a hospitalist during your hospital stay. If you have any questions about your discharge medications or the care you received while you were in the hospital after you are discharged, you can call the unit and asked to speak with the hospitalist on call if the hospitalist that took care of you is not available. Once you are discharged, your primary care physician will handle any further medical issues. Please note that NO REFILLS for any discharge medications will be authorized once you are discharged, as it is imperative that you return to your primary care physician (or establish a relationship with a primary care physician if you do not have one) for your aftercare needs so that they can reassess your need for medications and monitor your lab values.  Discharge Instructions    Diet - low sodium heart healthy  Complete by:  As directed      Increase activity slowly    Complete by:  As directed             Medication List    STOP taking these medications        ibuprofen 200 MG tablet  Commonly known as:  ADVIL,MOTRIN      TAKE these medications        acetaminophen 500 MG tablet  Commonly known as:  TYLENOL  Take 500 mg by mouth every 6 (six) hours as  needed for moderate pain.     allopurinol 100 MG tablet  Commonly known as:  ZYLOPRIM  Take 100 mg by mouth daily.     amLODipine-olmesartan 5-40 MG per tablet  Commonly known as:  AZOR  Take 1 tablet by mouth daily.     diclofenac 75 MG EC tablet  Commonly known as:  VOLTAREN  Take 75 mg by mouth daily as needed for moderate pain.     DUREZOL 0.05 % Emul  Generic drug:  Difluprednate  Place 1 drop into the left eye 3 (three) times daily.     omeprazole 20 MG capsule  Commonly known as:  PRILOSEC  Take 1 capsule (20 mg total) by mouth 2 (two) times daily before a meal.     PROLENSA 0.07 % Soln  Generic drug:  Bromfenac Sodium  Place 1 drop into the left eye daily.       Allergies  Allergen Reactions  . Codeine     GI upset nausea      The results of significant diagnostics from this hospitalization (including imaging, microbiology, ancillary and laboratory) are listed below for reference.    Significant Diagnostic Studies: Dg Abd Portable 2v  04/22/2014   CLINICAL DATA:  Epigastric and lower abdominal pain  EXAM: PORTABLE ABDOMEN - 2 VIEW  COMPARISON:  None.  FINDINGS: Supine and left lateral decubitus views of the abdomen are negative free intraperitoneal air. Stool and air is present throughout the colon to the rectum. There is a mildly generous volume of air throughout small bowel, more likely nonobstructive. No biliary or urinary calculi are evident.  IMPRESSION: Nonobstructive bowel gas pattern.  No free air.   Electronically Signed   By: Andreas Newport M.D.   On: 04/22/2014 06:53    Microbiology: Recent Results (from the past 240 hour(s))  MRSA PCR Screening     Status: None   Collection Time: 04/22/14  5:00 AM  Result Value Ref Range Status   MRSA by PCR NEGATIVE NEGATIVE Final    Comment:        The GeneXpert MRSA Assay (FDA approved for NASAL specimens only), is one component of a comprehensive MRSA colonization surveillance program. It is  not intended to diagnose MRSA infection nor to guide or monitor treatment for MRSA infections.      Labs: Basic Metabolic Panel:  Recent Labs Lab 04/22/14 0825 04/23/14 0606  NA 142 141  K 4.5 4.3  CL 115* 111  CO2 22 23  GLUCOSE 100* 98  BUN 54* 34*  CREATININE 1.01 1.09  CALCIUM 8.9 8.6  MG 1.6  --    Liver Function Tests:  Recent Labs Lab 04/22/14 0825  AST 14  ALT 10  ALKPHOS 48  BILITOT 0.4  PROT 5.1*  ALBUMIN 3.2*    Recent Labs Lab 04/22/14 0825  LIPASE 20   No results for input(s): AMMONIA in the last 168 hours. CBC:  Recent Labs Lab 04/22/14 0825 04/22/14 1828 04/23/14 0606 04/23/14 1550  WBC 7.9  --   --   --   NEUTROABS 4.9  --   --   --   HGB 9.7* 9.2* 8.6* 8.7*  HCT 28.9* 28.0* 26.0* 27.1*  MCV 87.3  --   --   --   PLT 261  --   --   --    Cardiac Enzymes: No results for input(s): CKTOTAL, CKMB, CKMBINDEX, TROPONINI in the last 168 hours. BNP: BNP (last 3 results)  Recent Labs  04/23/14 1550  BNP 16.4    CBG:  Recent Labs Lab 04/22/14 1833 04/22/14 1901 04/23/14 0009 04/23/14 0549 04/23/14 1146  GLUCAP 66* 89 74 99 88       Signed:  Emric Kowalewski K  [facility] 04/23/2014, 6:34 PM

## 2014-04-23 NOTE — Progress Notes (Signed)
NURSING PROGRESS NOTE  Olivia Powers [id number] Discharge Data: 04/23/2014 6:54 PM Attending Provider: Annita Brod, MD [id number], MD     Margarito Liner to be D/C'd Home per MD order.  Discussed with the patient the After Visit Summary and all questions fully answered. All IV's discontinued with no bleeding noted. All belongings returned to patient for patient to take home.   Last Vital Signs:  Blood pressure 115/51, pulse 100, temperature 97.5 F (36.4 C), temperature source Oral, resp. rate 18, height 4\' 11"  (1.499 m), weight 80.876 kg (178 lb 4.8 oz), SpO2 98 %.  Discharge Medication List   Medication List    STOP taking these medications        ibuprofen 200 MG tablet  Commonly known as:  ADVIL,MOTRIN      TAKE these medications        acetaminophen 500 MG tablet  Commonly known as:  TYLENOL  Take 500 mg by mouth every 6 (six) hours as needed for moderate pain.     allopurinol 100 MG tablet  Commonly known as:  ZYLOPRIM  Take 100 mg by mouth daily.     amLODipine-olmesartan 5-40 MG per tablet  Commonly known as:  AZOR  Take 1 tablet by mouth daily.     diclofenac 75 MG EC tablet  Commonly known as:  VOLTAREN  Take 75 mg by mouth daily as needed for moderate pain.     DUREZOL 0.05 % Emul  Generic drug:  Difluprednate  Place 1 drop into the left eye 3 (three) times daily.     omeprazole 20 MG capsule  Commonly known as:  PRILOSEC  Take 1 capsule (20 mg total) by mouth 2 (two) times daily before a meal.     PROLENSA 0.07 % Soln  Generic drug:  Bromfenac Sodium  Place 1 drop into the left eye daily.         Wallie Renshaw, RN

## 2015-02-19 DIAGNOSIS — E1165 Type 2 diabetes mellitus with hyperglycemia: Secondary | ICD-10-CM | POA: Diagnosis not present

## 2015-02-19 DIAGNOSIS — Z7189 Other specified counseling: Secondary | ICD-10-CM | POA: Diagnosis not present

## 2015-02-19 DIAGNOSIS — I10 Essential (primary) hypertension: Secondary | ICD-10-CM | POA: Diagnosis not present

## 2015-02-19 DIAGNOSIS — Z1389 Encounter for screening for other disorder: Secondary | ICD-10-CM | POA: Diagnosis not present

## 2015-02-19 DIAGNOSIS — Z0000 Encounter for general adult medical examination without abnormal findings: Secondary | ICD-10-CM | POA: Diagnosis not present

## 2015-02-19 DIAGNOSIS — R5383 Other fatigue: Secondary | ICD-10-CM | POA: Diagnosis not present

## 2015-02-19 DIAGNOSIS — Z1211 Encounter for screening for malignant neoplasm of colon: Secondary | ICD-10-CM | POA: Diagnosis not present

## 2015-02-19 DIAGNOSIS — M109 Gout, unspecified: Secondary | ICD-10-CM | POA: Diagnosis not present

## 2015-02-19 DIAGNOSIS — Z6837 Body mass index (BMI) 37.0-37.9, adult: Secondary | ICD-10-CM | POA: Diagnosis not present

## 2015-02-19 DIAGNOSIS — Z418 Encounter for other procedures for purposes other than remedying health state: Secondary | ICD-10-CM | POA: Diagnosis not present

## 2015-05-26 DIAGNOSIS — I10 Essential (primary) hypertension: Secondary | ICD-10-CM | POA: Diagnosis not present

## 2015-05-26 DIAGNOSIS — M159 Polyosteoarthritis, unspecified: Secondary | ICD-10-CM | POA: Diagnosis not present

## 2015-05-26 DIAGNOSIS — E119 Type 2 diabetes mellitus without complications: Secondary | ICD-10-CM | POA: Diagnosis not present

## 2015-05-28 DIAGNOSIS — Z789 Other specified health status: Secondary | ICD-10-CM | POA: Diagnosis not present

## 2015-05-28 DIAGNOSIS — M542 Cervicalgia: Secondary | ICD-10-CM | POA: Diagnosis not present

## 2015-05-28 DIAGNOSIS — E1165 Type 2 diabetes mellitus with hyperglycemia: Secondary | ICD-10-CM | POA: Diagnosis not present

## 2015-05-28 DIAGNOSIS — I10 Essential (primary) hypertension: Secondary | ICD-10-CM | POA: Diagnosis not present

## 2015-05-28 DIAGNOSIS — Z6837 Body mass index (BMI) 37.0-37.9, adult: Secondary | ICD-10-CM | POA: Diagnosis not present

## 2015-05-30 DIAGNOSIS — M542 Cervicalgia: Secondary | ICD-10-CM | POA: Diagnosis not present

## 2015-06-03 DIAGNOSIS — M5012 Mid-cervical disc disorder, unspecified level: Secondary | ICD-10-CM | POA: Diagnosis not present

## 2015-06-03 DIAGNOSIS — M5011 Cervical disc disorder with radiculopathy,  high cervical region: Secondary | ICD-10-CM | POA: Diagnosis not present

## 2015-06-03 DIAGNOSIS — M4722 Other spondylosis with radiculopathy, cervical region: Secondary | ICD-10-CM | POA: Diagnosis not present

## 2015-06-03 DIAGNOSIS — M50221 Other cervical disc displacement at C4-C5 level: Secondary | ICD-10-CM | POA: Diagnosis not present

## 2015-06-03 DIAGNOSIS — Z981 Arthrodesis status: Secondary | ICD-10-CM | POA: Diagnosis not present

## 2015-06-04 DIAGNOSIS — E119 Type 2 diabetes mellitus without complications: Secondary | ICD-10-CM | POA: Diagnosis not present

## 2015-06-04 DIAGNOSIS — M542 Cervicalgia: Secondary | ICD-10-CM | POA: Diagnosis not present

## 2015-06-04 DIAGNOSIS — M5412 Radiculopathy, cervical region: Secondary | ICD-10-CM | POA: Diagnosis not present

## 2015-06-04 DIAGNOSIS — H9192 Unspecified hearing loss, left ear: Secondary | ICD-10-CM | POA: Diagnosis not present

## 2015-06-04 DIAGNOSIS — M7541 Impingement syndrome of right shoulder: Secondary | ICD-10-CM | POA: Diagnosis not present

## 2015-06-04 DIAGNOSIS — M109 Gout, unspecified: Secondary | ICD-10-CM | POA: Diagnosis not present

## 2015-06-04 DIAGNOSIS — G2581 Restless legs syndrome: Secondary | ICD-10-CM | POA: Diagnosis not present

## 2015-06-04 DIAGNOSIS — M549 Dorsalgia, unspecified: Secondary | ICD-10-CM | POA: Diagnosis not present

## 2015-06-04 DIAGNOSIS — Z886 Allergy status to analgesic agent status: Secondary | ICD-10-CM | POA: Diagnosis not present

## 2015-06-04 DIAGNOSIS — R531 Weakness: Secondary | ICD-10-CM | POA: Diagnosis not present

## 2015-06-04 DIAGNOSIS — Z79899 Other long term (current) drug therapy: Secondary | ICD-10-CM | POA: Diagnosis not present

## 2015-06-04 DIAGNOSIS — Z96653 Presence of artificial knee joint, bilateral: Secondary | ICD-10-CM | POA: Diagnosis not present

## 2015-06-04 DIAGNOSIS — I10 Essential (primary) hypertension: Secondary | ICD-10-CM | POA: Diagnosis not present

## 2015-06-04 DIAGNOSIS — K219 Gastro-esophageal reflux disease without esophagitis: Secondary | ICD-10-CM | POA: Diagnosis not present

## 2015-06-04 DIAGNOSIS — M79601 Pain in right arm: Secondary | ICD-10-CM | POA: Diagnosis not present

## 2015-06-05 DIAGNOSIS — R531 Weakness: Secondary | ICD-10-CM | POA: Diagnosis not present

## 2015-06-05 DIAGNOSIS — E119 Type 2 diabetes mellitus without complications: Secondary | ICD-10-CM | POA: Diagnosis not present

## 2015-06-05 DIAGNOSIS — M5412 Radiculopathy, cervical region: Secondary | ICD-10-CM | POA: Diagnosis not present

## 2015-06-05 DIAGNOSIS — K8020 Calculus of gallbladder without cholecystitis without obstruction: Secondary | ICD-10-CM | POA: Diagnosis not present

## 2015-06-05 DIAGNOSIS — M7541 Impingement syndrome of right shoulder: Secondary | ICD-10-CM | POA: Diagnosis not present

## 2015-06-06 DIAGNOSIS — E119 Type 2 diabetes mellitus without complications: Secondary | ICD-10-CM | POA: Diagnosis not present

## 2015-06-06 DIAGNOSIS — M5412 Radiculopathy, cervical region: Secondary | ICD-10-CM | POA: Diagnosis not present

## 2015-06-06 DIAGNOSIS — M5020 Other cervical disc displacement, unspecified cervical region: Secondary | ICD-10-CM | POA: Diagnosis not present

## 2015-06-06 DIAGNOSIS — S46009A Unspecified injury of muscle(s) and tendon(s) of the rotator cuff of unspecified shoulder, initial encounter: Secondary | ICD-10-CM | POA: Diagnosis not present

## 2015-06-06 DIAGNOSIS — M47812 Spondylosis without myelopathy or radiculopathy, cervical region: Secondary | ICD-10-CM | POA: Diagnosis not present

## 2015-06-06 DIAGNOSIS — M7541 Impingement syndrome of right shoulder: Secondary | ICD-10-CM | POA: Diagnosis not present

## 2015-06-06 DIAGNOSIS — S46001A Unspecified injury of muscle(s) and tendon(s) of the rotator cuff of right shoulder, initial encounter: Secondary | ICD-10-CM | POA: Diagnosis not present

## 2015-06-06 DIAGNOSIS — R531 Weakness: Secondary | ICD-10-CM | POA: Diagnosis not present

## 2015-06-09 DIAGNOSIS — M50223 Other cervical disc displacement at C6-C7 level: Secondary | ICD-10-CM | POA: Diagnosis not present

## 2015-06-09 DIAGNOSIS — M50123 Cervical disc disorder at C6-C7 level with radiculopathy: Secondary | ICD-10-CM | POA: Diagnosis not present

## 2015-06-09 DIAGNOSIS — M4322 Fusion of spine, cervical region: Secondary | ICD-10-CM | POA: Diagnosis not present

## 2015-06-09 DIAGNOSIS — M47892 Other spondylosis, cervical region: Secondary | ICD-10-CM | POA: Diagnosis not present

## 2015-06-09 DIAGNOSIS — I10 Essential (primary) hypertension: Secondary | ICD-10-CM | POA: Diagnosis not present

## 2015-06-09 DIAGNOSIS — R270 Ataxia, unspecified: Secondary | ICD-10-CM | POA: Diagnosis not present

## 2015-06-09 DIAGNOSIS — M1990 Unspecified osteoarthritis, unspecified site: Secondary | ICD-10-CM | POA: Diagnosis not present

## 2015-06-09 DIAGNOSIS — R42 Dizziness and giddiness: Secondary | ICD-10-CM | POA: Diagnosis not present

## 2015-06-09 DIAGNOSIS — E119 Type 2 diabetes mellitus without complications: Secondary | ICD-10-CM | POA: Diagnosis not present

## 2015-06-09 DIAGNOSIS — M47812 Spondylosis without myelopathy or radiculopathy, cervical region: Secondary | ICD-10-CM | POA: Diagnosis not present

## 2015-06-09 DIAGNOSIS — R1310 Dysphagia, unspecified: Secondary | ICD-10-CM | POA: Diagnosis not present

## 2015-06-09 DIAGNOSIS — M109 Gout, unspecified: Secondary | ICD-10-CM | POA: Diagnosis not present

## 2015-06-09 DIAGNOSIS — M5020 Other cervical disc displacement, unspecified cervical region: Secondary | ICD-10-CM | POA: Diagnosis not present

## 2015-06-09 DIAGNOSIS — H9192 Unspecified hearing loss, left ear: Secondary | ICD-10-CM | POA: Diagnosis not present

## 2015-06-09 DIAGNOSIS — E7800 Pure hypercholesterolemia, unspecified: Secondary | ICD-10-CM | POA: Diagnosis not present

## 2015-06-09 DIAGNOSIS — Z886 Allergy status to analgesic agent status: Secondary | ICD-10-CM | POA: Diagnosis not present

## 2015-06-10 DIAGNOSIS — E119 Type 2 diabetes mellitus without complications: Secondary | ICD-10-CM | POA: Diagnosis not present

## 2015-06-10 DIAGNOSIS — R42 Dizziness and giddiness: Secondary | ICD-10-CM | POA: Diagnosis not present

## 2015-06-10 DIAGNOSIS — I10 Essential (primary) hypertension: Secondary | ICD-10-CM | POA: Diagnosis not present

## 2015-06-11 DIAGNOSIS — H9192 Unspecified hearing loss, left ear: Secondary | ICD-10-CM | POA: Diagnosis present

## 2015-06-11 DIAGNOSIS — M109 Gout, unspecified: Secondary | ICD-10-CM | POA: Diagnosis present

## 2015-06-11 DIAGNOSIS — R270 Ataxia, unspecified: Secondary | ICD-10-CM | POA: Diagnosis present

## 2015-06-11 DIAGNOSIS — M47892 Other spondylosis, cervical region: Secondary | ICD-10-CM | POA: Diagnosis present

## 2015-06-11 DIAGNOSIS — Z886 Allergy status to analgesic agent status: Secondary | ICD-10-CM | POA: Diagnosis not present

## 2015-06-11 DIAGNOSIS — M6281 Muscle weakness (generalized): Secondary | ICD-10-CM | POA: Diagnosis not present

## 2015-06-11 DIAGNOSIS — I10 Essential (primary) hypertension: Secondary | ICD-10-CM | POA: Diagnosis present

## 2015-06-11 DIAGNOSIS — Z79899 Other long term (current) drug therapy: Secondary | ICD-10-CM | POA: Diagnosis not present

## 2015-06-11 DIAGNOSIS — R42 Dizziness and giddiness: Secondary | ICD-10-CM | POA: Diagnosis present

## 2015-06-11 DIAGNOSIS — R278 Other lack of coordination: Secondary | ICD-10-CM | POA: Diagnosis not present

## 2015-06-11 DIAGNOSIS — Z981 Arthrodesis status: Secondary | ICD-10-CM | POA: Diagnosis not present

## 2015-06-11 DIAGNOSIS — E119 Type 2 diabetes mellitus without complications: Secondary | ICD-10-CM | POA: Diagnosis present

## 2015-06-11 DIAGNOSIS — M5127 Other intervertebral disc displacement, lumbosacral region: Secondary | ICD-10-CM | POA: Diagnosis not present

## 2015-06-11 DIAGNOSIS — M4322 Fusion of spine, cervical region: Secondary | ICD-10-CM | POA: Diagnosis not present

## 2015-06-11 DIAGNOSIS — R1310 Dysphagia, unspecified: Secondary | ICD-10-CM | POA: Diagnosis present

## 2015-06-11 DIAGNOSIS — R2689 Other abnormalities of gait and mobility: Secondary | ICD-10-CM | POA: Diagnosis not present

## 2015-06-11 DIAGNOSIS — G2581 Restless legs syndrome: Secondary | ICD-10-CM | POA: Diagnosis present

## 2015-06-11 DIAGNOSIS — M1990 Unspecified osteoarthritis, unspecified site: Secondary | ICD-10-CM | POA: Diagnosis present

## 2015-06-11 DIAGNOSIS — Z4789 Encounter for other orthopedic aftercare: Secondary | ICD-10-CM | POA: Diagnosis not present

## 2015-06-11 DIAGNOSIS — Z7984 Long term (current) use of oral hypoglycemic drugs: Secondary | ICD-10-CM | POA: Diagnosis not present

## 2015-06-11 DIAGNOSIS — E7800 Pure hypercholesterolemia, unspecified: Secondary | ICD-10-CM | POA: Diagnosis present

## 2015-06-11 DIAGNOSIS — M50123 Cervical disc disorder at C6-C7 level with radiculopathy: Secondary | ICD-10-CM | POA: Diagnosis present

## 2015-06-14 DIAGNOSIS — M5127 Other intervertebral disc displacement, lumbosacral region: Secondary | ICD-10-CM | POA: Diagnosis not present

## 2015-06-14 DIAGNOSIS — I10 Essential (primary) hypertension: Secondary | ICD-10-CM | POA: Diagnosis not present

## 2015-06-14 DIAGNOSIS — R42 Dizziness and giddiness: Secondary | ICD-10-CM | POA: Diagnosis not present

## 2015-06-14 DIAGNOSIS — M4322 Fusion of spine, cervical region: Secondary | ICD-10-CM | POA: Diagnosis not present

## 2015-06-14 DIAGNOSIS — E7800 Pure hypercholesterolemia, unspecified: Secondary | ICD-10-CM | POA: Diagnosis not present

## 2015-06-14 DIAGNOSIS — Z4789 Encounter for other orthopedic aftercare: Secondary | ICD-10-CM | POA: Diagnosis not present

## 2015-06-14 DIAGNOSIS — E119 Type 2 diabetes mellitus without complications: Secondary | ICD-10-CM | POA: Diagnosis not present

## 2015-06-14 DIAGNOSIS — R270 Ataxia, unspecified: Secondary | ICD-10-CM | POA: Diagnosis not present

## 2015-06-14 DIAGNOSIS — M6281 Muscle weakness (generalized): Secondary | ICD-10-CM | POA: Diagnosis not present

## 2015-06-14 DIAGNOSIS — R2689 Other abnormalities of gait and mobility: Secondary | ICD-10-CM | POA: Diagnosis not present

## 2015-06-14 DIAGNOSIS — Z981 Arthrodesis status: Secondary | ICD-10-CM | POA: Diagnosis not present

## 2015-06-14 DIAGNOSIS — M47892 Other spondylosis, cervical region: Secondary | ICD-10-CM | POA: Diagnosis not present

## 2015-06-14 DIAGNOSIS — R278 Other lack of coordination: Secondary | ICD-10-CM | POA: Diagnosis not present

## 2015-06-16 DIAGNOSIS — I10 Essential (primary) hypertension: Secondary | ICD-10-CM | POA: Diagnosis not present

## 2015-06-16 DIAGNOSIS — E119 Type 2 diabetes mellitus without complications: Secondary | ICD-10-CM | POA: Diagnosis not present

## 2015-06-16 DIAGNOSIS — M47892 Other spondylosis, cervical region: Secondary | ICD-10-CM | POA: Diagnosis not present

## 2015-06-16 DIAGNOSIS — R270 Ataxia, unspecified: Secondary | ICD-10-CM | POA: Diagnosis not present

## 2015-07-07 DIAGNOSIS — Z4789 Encounter for other orthopedic aftercare: Secondary | ICD-10-CM | POA: Diagnosis not present

## 2015-07-07 DIAGNOSIS — Z981 Arthrodesis status: Secondary | ICD-10-CM | POA: Diagnosis not present

## 2015-07-07 DIAGNOSIS — M5127 Other intervertebral disc displacement, lumbosacral region: Secondary | ICD-10-CM | POA: Diagnosis not present

## 2015-07-07 DIAGNOSIS — M1990 Unspecified osteoarthritis, unspecified site: Secondary | ICD-10-CM | POA: Diagnosis not present

## 2015-07-07 DIAGNOSIS — E119 Type 2 diabetes mellitus without complications: Secondary | ICD-10-CM | POA: Diagnosis not present

## 2015-07-07 DIAGNOSIS — I10 Essential (primary) hypertension: Secondary | ICD-10-CM | POA: Diagnosis not present

## 2015-07-08 DIAGNOSIS — M1990 Unspecified osteoarthritis, unspecified site: Secondary | ICD-10-CM | POA: Diagnosis not present

## 2015-07-08 DIAGNOSIS — M5127 Other intervertebral disc displacement, lumbosacral region: Secondary | ICD-10-CM | POA: Diagnosis not present

## 2015-07-08 DIAGNOSIS — I10 Essential (primary) hypertension: Secondary | ICD-10-CM | POA: Diagnosis not present

## 2015-07-08 DIAGNOSIS — E119 Type 2 diabetes mellitus without complications: Secondary | ICD-10-CM | POA: Diagnosis not present

## 2015-07-08 DIAGNOSIS — Z4789 Encounter for other orthopedic aftercare: Secondary | ICD-10-CM | POA: Diagnosis not present

## 2015-07-08 DIAGNOSIS — Z981 Arthrodesis status: Secondary | ICD-10-CM | POA: Diagnosis not present

## 2015-07-10 DIAGNOSIS — I10 Essential (primary) hypertension: Secondary | ICD-10-CM | POA: Diagnosis not present

## 2015-07-10 DIAGNOSIS — Z299 Encounter for prophylactic measures, unspecified: Secondary | ICD-10-CM | POA: Diagnosis not present

## 2015-07-10 DIAGNOSIS — Z9889 Other specified postprocedural states: Secondary | ICD-10-CM | POA: Diagnosis not present

## 2015-07-10 DIAGNOSIS — E1165 Type 2 diabetes mellitus with hyperglycemia: Secondary | ICD-10-CM | POA: Diagnosis not present

## 2015-07-11 DIAGNOSIS — M47812 Spondylosis without myelopathy or radiculopathy, cervical region: Secondary | ICD-10-CM | POA: Diagnosis not present

## 2015-07-11 DIAGNOSIS — Z981 Arthrodesis status: Secondary | ICD-10-CM | POA: Diagnosis not present

## 2015-07-11 DIAGNOSIS — M50322 Other cervical disc degeneration at C5-C6 level: Secondary | ICD-10-CM | POA: Diagnosis not present

## 2015-08-19 DIAGNOSIS — E7800 Pure hypercholesterolemia, unspecified: Secondary | ICD-10-CM | POA: Diagnosis not present

## 2015-08-19 DIAGNOSIS — K219 Gastro-esophageal reflux disease without esophagitis: Secondary | ICD-10-CM | POA: Diagnosis not present

## 2015-08-19 DIAGNOSIS — E1165 Type 2 diabetes mellitus with hyperglycemia: Secondary | ICD-10-CM | POA: Diagnosis not present

## 2015-08-19 DIAGNOSIS — E2839 Other primary ovarian failure: Secondary | ICD-10-CM | POA: Diagnosis not present

## 2015-08-19 DIAGNOSIS — I10 Essential (primary) hypertension: Secondary | ICD-10-CM | POA: Diagnosis not present

## 2015-08-21 DIAGNOSIS — I10 Essential (primary) hypertension: Secondary | ICD-10-CM | POA: Diagnosis not present

## 2015-08-21 DIAGNOSIS — E7800 Pure hypercholesterolemia, unspecified: Secondary | ICD-10-CM | POA: Diagnosis not present

## 2015-08-21 DIAGNOSIS — E119 Type 2 diabetes mellitus without complications: Secondary | ICD-10-CM | POA: Diagnosis not present

## 2015-09-01 DIAGNOSIS — H524 Presbyopia: Secondary | ICD-10-CM | POA: Diagnosis not present

## 2015-09-01 DIAGNOSIS — E119 Type 2 diabetes mellitus without complications: Secondary | ICD-10-CM | POA: Diagnosis not present

## 2015-10-05 DIAGNOSIS — R1110 Vomiting, unspecified: Secondary | ICD-10-CM | POA: Diagnosis not present

## 2015-10-05 DIAGNOSIS — Z885 Allergy status to narcotic agent status: Secondary | ICD-10-CM | POA: Diagnosis not present

## 2015-10-05 DIAGNOSIS — R079 Chest pain, unspecified: Secondary | ICD-10-CM | POA: Diagnosis not present

## 2015-10-05 DIAGNOSIS — Z79899 Other long term (current) drug therapy: Secondary | ICD-10-CM | POA: Diagnosis not present

## 2015-10-05 DIAGNOSIS — G2581 Restless legs syndrome: Secondary | ICD-10-CM | POA: Diagnosis not present

## 2015-10-05 DIAGNOSIS — R1013 Epigastric pain: Secondary | ICD-10-CM | POA: Diagnosis not present

## 2015-10-05 DIAGNOSIS — I10 Essential (primary) hypertension: Secondary | ICD-10-CM | POA: Diagnosis not present

## 2015-10-14 DIAGNOSIS — I10 Essential (primary) hypertension: Secondary | ICD-10-CM | POA: Diagnosis not present

## 2015-10-14 DIAGNOSIS — R079 Chest pain, unspecified: Secondary | ICD-10-CM | POA: Diagnosis not present

## 2015-10-14 DIAGNOSIS — Z6837 Body mass index (BMI) 37.0-37.9, adult: Secondary | ICD-10-CM | POA: Diagnosis not present

## 2015-10-14 DIAGNOSIS — Z299 Encounter for prophylactic measures, unspecified: Secondary | ICD-10-CM | POA: Diagnosis not present

## 2015-10-14 DIAGNOSIS — E1165 Type 2 diabetes mellitus with hyperglycemia: Secondary | ICD-10-CM | POA: Diagnosis not present

## 2015-10-23 DIAGNOSIS — R079 Chest pain, unspecified: Secondary | ICD-10-CM | POA: Diagnosis not present

## 2015-11-04 DIAGNOSIS — Z6836 Body mass index (BMI) 36.0-36.9, adult: Secondary | ICD-10-CM | POA: Diagnosis not present

## 2015-11-04 DIAGNOSIS — I10 Essential (primary) hypertension: Secondary | ICD-10-CM | POA: Diagnosis not present

## 2015-11-04 DIAGNOSIS — K219 Gastro-esophageal reflux disease without esophagitis: Secondary | ICD-10-CM | POA: Diagnosis not present

## 2015-11-04 DIAGNOSIS — Z299 Encounter for prophylactic measures, unspecified: Secondary | ICD-10-CM | POA: Diagnosis not present

## 2015-11-04 DIAGNOSIS — E7800 Pure hypercholesterolemia, unspecified: Secondary | ICD-10-CM | POA: Diagnosis not present

## 2015-11-04 DIAGNOSIS — E1165 Type 2 diabetes mellitus with hyperglycemia: Secondary | ICD-10-CM | POA: Diagnosis not present

## 2015-12-02 DIAGNOSIS — E119 Type 2 diabetes mellitus without complications: Secondary | ICD-10-CM | POA: Diagnosis not present

## 2015-12-02 DIAGNOSIS — I10 Essential (primary) hypertension: Secondary | ICD-10-CM | POA: Diagnosis not present

## 2015-12-02 DIAGNOSIS — M159 Polyosteoarthritis, unspecified: Secondary | ICD-10-CM | POA: Diagnosis not present

## 2016-02-03 DIAGNOSIS — E119 Type 2 diabetes mellitus without complications: Secondary | ICD-10-CM | POA: Diagnosis not present

## 2016-02-03 DIAGNOSIS — M159 Polyosteoarthritis, unspecified: Secondary | ICD-10-CM | POA: Diagnosis not present

## 2016-02-03 DIAGNOSIS — I10 Essential (primary) hypertension: Secondary | ICD-10-CM | POA: Diagnosis not present

## 2016-02-25 DIAGNOSIS — Z299 Encounter for prophylactic measures, unspecified: Secondary | ICD-10-CM | POA: Diagnosis not present

## 2016-02-25 DIAGNOSIS — Z0000 Encounter for general adult medical examination without abnormal findings: Secondary | ICD-10-CM | POA: Diagnosis not present

## 2016-02-25 DIAGNOSIS — Z1389 Encounter for screening for other disorder: Secondary | ICD-10-CM | POA: Diagnosis not present

## 2016-02-25 DIAGNOSIS — Z7189 Other specified counseling: Secondary | ICD-10-CM | POA: Diagnosis not present

## 2016-02-25 DIAGNOSIS — R5383 Other fatigue: Secondary | ICD-10-CM | POA: Diagnosis not present

## 2016-02-25 DIAGNOSIS — E7800 Pure hypercholesterolemia, unspecified: Secondary | ICD-10-CM | POA: Diagnosis not present

## 2016-02-25 DIAGNOSIS — Z1211 Encounter for screening for malignant neoplasm of colon: Secondary | ICD-10-CM | POA: Diagnosis not present

## 2016-03-04 DIAGNOSIS — I10 Essential (primary) hypertension: Secondary | ICD-10-CM | POA: Diagnosis not present

## 2016-03-04 DIAGNOSIS — Z299 Encounter for prophylactic measures, unspecified: Secondary | ICD-10-CM | POA: Diagnosis not present

## 2016-03-04 DIAGNOSIS — E1365 Other specified diabetes mellitus with hyperglycemia: Secondary | ICD-10-CM | POA: Diagnosis not present

## 2016-03-04 DIAGNOSIS — M25562 Pain in left knee: Secondary | ICD-10-CM | POA: Diagnosis not present

## 2016-03-04 DIAGNOSIS — N181 Chronic kidney disease, stage 1: Secondary | ICD-10-CM | POA: Diagnosis not present

## 2016-03-04 DIAGNOSIS — Z713 Dietary counseling and surveillance: Secondary | ICD-10-CM | POA: Diagnosis not present

## 2016-03-04 DIAGNOSIS — M25561 Pain in right knee: Secondary | ICD-10-CM | POA: Diagnosis not present

## 2016-03-04 DIAGNOSIS — Z6837 Body mass index (BMI) 37.0-37.9, adult: Secondary | ICD-10-CM | POA: Diagnosis not present

## 2016-03-04 DIAGNOSIS — Z789 Other specified health status: Secondary | ICD-10-CM | POA: Diagnosis not present

## 2016-03-04 DIAGNOSIS — E1322 Other specified diabetes mellitus with diabetic chronic kidney disease: Secondary | ICD-10-CM | POA: Diagnosis not present

## 2016-03-04 DIAGNOSIS — L309 Dermatitis, unspecified: Secondary | ICD-10-CM | POA: Diagnosis not present

## 2016-03-11 DIAGNOSIS — M25562 Pain in left knee: Secondary | ICD-10-CM | POA: Diagnosis not present

## 2016-03-11 DIAGNOSIS — Z96653 Presence of artificial knee joint, bilateral: Secondary | ICD-10-CM | POA: Diagnosis not present

## 2016-03-24 DIAGNOSIS — M159 Polyosteoarthritis, unspecified: Secondary | ICD-10-CM | POA: Diagnosis not present

## 2016-03-24 DIAGNOSIS — E119 Type 2 diabetes mellitus without complications: Secondary | ICD-10-CM | POA: Diagnosis not present

## 2016-03-24 DIAGNOSIS — I10 Essential (primary) hypertension: Secondary | ICD-10-CM | POA: Diagnosis not present

## 2016-04-22 DIAGNOSIS — Z96653 Presence of artificial knee joint, bilateral: Secondary | ICD-10-CM | POA: Diagnosis not present

## 2016-04-22 DIAGNOSIS — M25562 Pain in left knee: Secondary | ICD-10-CM | POA: Diagnosis not present

## 2016-04-22 DIAGNOSIS — R29898 Other symptoms and signs involving the musculoskeletal system: Secondary | ICD-10-CM | POA: Diagnosis not present

## 2016-05-04 DIAGNOSIS — M4716 Other spondylosis with myelopathy, lumbar region: Secondary | ICD-10-CM | POA: Diagnosis not present

## 2016-05-04 DIAGNOSIS — M5020 Other cervical disc displacement, unspecified cervical region: Secondary | ICD-10-CM | POA: Diagnosis not present

## 2016-05-04 DIAGNOSIS — M5136 Other intervertebral disc degeneration, lumbar region: Secondary | ICD-10-CM | POA: Diagnosis not present

## 2016-05-04 DIAGNOSIS — M47817 Spondylosis without myelopathy or radiculopathy, lumbosacral region: Secondary | ICD-10-CM | POA: Diagnosis not present

## 2016-05-04 DIAGNOSIS — M47812 Spondylosis without myelopathy or radiculopathy, cervical region: Secondary | ICD-10-CM | POA: Diagnosis not present

## 2016-05-24 DIAGNOSIS — M4716 Other spondylosis with myelopathy, lumbar region: Secondary | ICD-10-CM | POA: Diagnosis not present

## 2016-05-25 DIAGNOSIS — M159 Polyosteoarthritis, unspecified: Secondary | ICD-10-CM | POA: Diagnosis not present

## 2016-05-25 DIAGNOSIS — E119 Type 2 diabetes mellitus without complications: Secondary | ICD-10-CM | POA: Diagnosis not present

## 2016-05-25 DIAGNOSIS — I10 Essential (primary) hypertension: Secondary | ICD-10-CM | POA: Diagnosis not present

## 2016-06-04 DIAGNOSIS — M48061 Spinal stenosis, lumbar region without neurogenic claudication: Secondary | ICD-10-CM | POA: Diagnosis not present

## 2016-06-04 DIAGNOSIS — M9983 Other biomechanical lesions of lumbar region: Secondary | ICD-10-CM | POA: Diagnosis not present

## 2016-06-04 DIAGNOSIS — M4716 Other spondylosis with myelopathy, lumbar region: Secondary | ICD-10-CM | POA: Diagnosis not present

## 2016-06-04 DIAGNOSIS — M5126 Other intervertebral disc displacement, lumbar region: Secondary | ICD-10-CM | POA: Diagnosis not present

## 2016-06-07 ENCOUNTER — Other Ambulatory Visit (HOSPITAL_COMMUNITY): Payer: Self-pay | Admitting: Neurosurgery

## 2016-06-07 DIAGNOSIS — Z780 Asymptomatic menopausal state: Secondary | ICD-10-CM | POA: Diagnosis not present

## 2016-06-07 DIAGNOSIS — M859 Disorder of bone density and structure, unspecified: Secondary | ICD-10-CM | POA: Diagnosis not present

## 2016-06-07 DIAGNOSIS — M4716 Other spondylosis with myelopathy, lumbar region: Secondary | ICD-10-CM | POA: Diagnosis not present

## 2016-06-10 DIAGNOSIS — M4802 Spinal stenosis, cervical region: Secondary | ICD-10-CM | POA: Diagnosis not present

## 2016-06-10 DIAGNOSIS — E119 Type 2 diabetes mellitus without complications: Secondary | ICD-10-CM | POA: Diagnosis not present

## 2016-06-10 DIAGNOSIS — H905 Unspecified sensorineural hearing loss: Secondary | ICD-10-CM | POA: Diagnosis not present

## 2016-06-10 DIAGNOSIS — M109 Gout, unspecified: Secondary | ICD-10-CM | POA: Diagnosis not present

## 2016-06-10 DIAGNOSIS — Z299 Encounter for prophylactic measures, unspecified: Secondary | ICD-10-CM | POA: Diagnosis not present

## 2016-06-10 DIAGNOSIS — K219 Gastro-esophageal reflux disease without esophagitis: Secondary | ICD-10-CM | POA: Diagnosis not present

## 2016-06-10 DIAGNOSIS — G250 Essential tremor: Secondary | ICD-10-CM | POA: Diagnosis not present

## 2016-06-10 DIAGNOSIS — G5601 Carpal tunnel syndrome, right upper limb: Secondary | ICD-10-CM | POA: Diagnosis not present

## 2016-06-10 DIAGNOSIS — I10 Essential (primary) hypertension: Secondary | ICD-10-CM | POA: Diagnosis not present

## 2016-06-10 DIAGNOSIS — Z6837 Body mass index (BMI) 37.0-37.9, adult: Secondary | ICD-10-CM | POA: Diagnosis not present

## 2016-06-10 DIAGNOSIS — E7800 Pure hypercholesterolemia, unspecified: Secondary | ICD-10-CM | POA: Diagnosis not present

## 2016-06-10 DIAGNOSIS — G2581 Restless legs syndrome: Secondary | ICD-10-CM | POA: Diagnosis not present

## 2016-06-23 ENCOUNTER — Other Ambulatory Visit (HOSPITAL_COMMUNITY): Payer: Medicare Other

## 2016-06-23 ENCOUNTER — Encounter (HOSPITAL_COMMUNITY): Payer: Self-pay

## 2016-06-24 DIAGNOSIS — M4716 Other spondylosis with myelopathy, lumbar region: Secondary | ICD-10-CM | POA: Diagnosis not present

## 2016-06-30 DIAGNOSIS — M159 Polyosteoarthritis, unspecified: Secondary | ICD-10-CM | POA: Diagnosis not present

## 2016-06-30 DIAGNOSIS — E119 Type 2 diabetes mellitus without complications: Secondary | ICD-10-CM | POA: Diagnosis not present

## 2016-06-30 DIAGNOSIS — I10 Essential (primary) hypertension: Secondary | ICD-10-CM | POA: Diagnosis not present

## 2016-07-16 DIAGNOSIS — E785 Hyperlipidemia, unspecified: Secondary | ICD-10-CM | POA: Diagnosis not present

## 2016-07-16 DIAGNOSIS — M48061 Spinal stenosis, lumbar region without neurogenic claudication: Secondary | ICD-10-CM | POA: Diagnosis present

## 2016-07-16 DIAGNOSIS — M47816 Spondylosis without myelopathy or radiculopathy, lumbar region: Secondary | ICD-10-CM | POA: Diagnosis not present

## 2016-07-16 DIAGNOSIS — Z4789 Encounter for other orthopedic aftercare: Secondary | ICD-10-CM | POA: Diagnosis not present

## 2016-07-16 DIAGNOSIS — E7800 Pure hypercholesterolemia, unspecified: Secondary | ICD-10-CM | POA: Diagnosis present

## 2016-07-16 DIAGNOSIS — I10 Essential (primary) hypertension: Secondary | ICD-10-CM | POA: Diagnosis present

## 2016-07-16 DIAGNOSIS — K219 Gastro-esophageal reflux disease without esophagitis: Secondary | ICD-10-CM | POA: Diagnosis present

## 2016-07-16 DIAGNOSIS — M6281 Muscle weakness (generalized): Secondary | ICD-10-CM | POA: Diagnosis not present

## 2016-07-16 DIAGNOSIS — M4326 Fusion of spine, lumbar region: Secondary | ICD-10-CM | POA: Diagnosis not present

## 2016-07-16 DIAGNOSIS — Z886 Allergy status to analgesic agent status: Secondary | ICD-10-CM | POA: Diagnosis not present

## 2016-07-16 DIAGNOSIS — E119 Type 2 diabetes mellitus without complications: Secondary | ICD-10-CM | POA: Diagnosis present

## 2016-07-16 DIAGNOSIS — Z79899 Other long term (current) drug therapy: Secondary | ICD-10-CM | POA: Diagnosis not present

## 2016-07-16 DIAGNOSIS — K5900 Constipation, unspecified: Secondary | ICD-10-CM | POA: Diagnosis not present

## 2016-07-16 DIAGNOSIS — M4316 Spondylolisthesis, lumbar region: Secondary | ICD-10-CM | POA: Diagnosis present

## 2016-07-16 DIAGNOSIS — M4716 Other spondylosis with myelopathy, lumbar region: Secondary | ICD-10-CM | POA: Diagnosis present

## 2016-07-16 DIAGNOSIS — M1990 Unspecified osteoarthritis, unspecified site: Secondary | ICD-10-CM | POA: Diagnosis present

## 2016-07-16 DIAGNOSIS — R2681 Unsteadiness on feet: Secondary | ICD-10-CM | POA: Diagnosis not present

## 2016-07-16 DIAGNOSIS — Z981 Arthrodesis status: Secondary | ICD-10-CM | POA: Diagnosis not present

## 2016-07-16 DIAGNOSIS — M109 Gout, unspecified: Secondary | ICD-10-CM | POA: Diagnosis present

## 2016-07-16 DIAGNOSIS — L538 Other specified erythematous conditions: Secondary | ICD-10-CM | POA: Diagnosis not present

## 2016-07-20 DIAGNOSIS — E119 Type 2 diabetes mellitus without complications: Secondary | ICD-10-CM | POA: Diagnosis not present

## 2016-07-20 DIAGNOSIS — L538 Other specified erythematous conditions: Secondary | ICD-10-CM | POA: Diagnosis not present

## 2016-07-20 DIAGNOSIS — M47817 Spondylosis without myelopathy or radiculopathy, lumbosacral region: Secondary | ICD-10-CM | POA: Diagnosis not present

## 2016-07-20 DIAGNOSIS — M79604 Pain in right leg: Secondary | ICD-10-CM | POA: Diagnosis not present

## 2016-07-20 DIAGNOSIS — M6281 Muscle weakness (generalized): Secondary | ICD-10-CM | POA: Diagnosis not present

## 2016-07-20 DIAGNOSIS — M48061 Spinal stenosis, lumbar region without neurogenic claudication: Secondary | ICD-10-CM | POA: Diagnosis not present

## 2016-07-20 DIAGNOSIS — M109 Gout, unspecified: Secondary | ICD-10-CM | POA: Diagnosis not present

## 2016-07-20 DIAGNOSIS — M47816 Spondylosis without myelopathy or radiculopathy, lumbar region: Secondary | ICD-10-CM | POA: Diagnosis not present

## 2016-07-20 DIAGNOSIS — M4316 Spondylolisthesis, lumbar region: Secondary | ICD-10-CM | POA: Diagnosis not present

## 2016-07-20 DIAGNOSIS — K5900 Constipation, unspecified: Secondary | ICD-10-CM | POA: Diagnosis not present

## 2016-07-20 DIAGNOSIS — E7800 Pure hypercholesterolemia, unspecified: Secondary | ICD-10-CM | POA: Diagnosis not present

## 2016-07-20 DIAGNOSIS — M79605 Pain in left leg: Secondary | ICD-10-CM | POA: Diagnosis not present

## 2016-07-20 DIAGNOSIS — I10 Essential (primary) hypertension: Secondary | ICD-10-CM | POA: Diagnosis not present

## 2016-07-20 DIAGNOSIS — S32048A Other fracture of fourth lumbar vertebra, initial encounter for closed fracture: Secondary | ICD-10-CM | POA: Diagnosis not present

## 2016-07-20 DIAGNOSIS — M5126 Other intervertebral disc displacement, lumbar region: Secondary | ICD-10-CM | POA: Diagnosis not present

## 2016-07-20 DIAGNOSIS — Z981 Arthrodesis status: Secondary | ICD-10-CM | POA: Diagnosis not present

## 2016-07-20 DIAGNOSIS — M1990 Unspecified osteoarthritis, unspecified site: Secondary | ICD-10-CM | POA: Diagnosis not present

## 2016-07-20 DIAGNOSIS — K219 Gastro-esophageal reflux disease without esophagitis: Secondary | ICD-10-CM | POA: Diagnosis not present

## 2016-07-20 DIAGNOSIS — R2681 Unsteadiness on feet: Secondary | ICD-10-CM | POA: Diagnosis not present

## 2016-07-20 DIAGNOSIS — M4716 Other spondylosis with myelopathy, lumbar region: Secondary | ICD-10-CM | POA: Diagnosis not present

## 2016-07-20 DIAGNOSIS — Z4789 Encounter for other orthopedic aftercare: Secondary | ICD-10-CM | POA: Diagnosis not present

## 2016-07-21 DIAGNOSIS — E119 Type 2 diabetes mellitus without complications: Secondary | ICD-10-CM | POA: Diagnosis not present

## 2016-07-21 DIAGNOSIS — Z981 Arthrodesis status: Secondary | ICD-10-CM | POA: Diagnosis not present

## 2016-08-03 DIAGNOSIS — M79604 Pain in right leg: Secondary | ICD-10-CM | POA: Diagnosis not present

## 2016-08-03 DIAGNOSIS — M79605 Pain in left leg: Secondary | ICD-10-CM | POA: Diagnosis not present

## 2016-08-03 DIAGNOSIS — Z981 Arthrodesis status: Secondary | ICD-10-CM | POA: Diagnosis not present

## 2016-08-03 DIAGNOSIS — M5126 Other intervertebral disc displacement, lumbar region: Secondary | ICD-10-CM | POA: Diagnosis not present

## 2016-08-03 DIAGNOSIS — M47817 Spondylosis without myelopathy or radiculopathy, lumbosacral region: Secondary | ICD-10-CM | POA: Diagnosis not present

## 2016-08-03 DIAGNOSIS — S32048A Other fracture of fourth lumbar vertebra, initial encounter for closed fracture: Secondary | ICD-10-CM | POA: Diagnosis not present

## 2016-08-03 DIAGNOSIS — M47816 Spondylosis without myelopathy or radiculopathy, lumbar region: Secondary | ICD-10-CM | POA: Diagnosis not present

## 2016-08-11 DIAGNOSIS — Z96653 Presence of artificial knee joint, bilateral: Secondary | ICD-10-CM | POA: Diagnosis not present

## 2016-08-11 DIAGNOSIS — E119 Type 2 diabetes mellitus without complications: Secondary | ICD-10-CM | POA: Diagnosis not present

## 2016-08-11 DIAGNOSIS — M48061 Spinal stenosis, lumbar region without neurogenic claudication: Secondary | ICD-10-CM | POA: Diagnosis not present

## 2016-08-11 DIAGNOSIS — M4316 Spondylolisthesis, lumbar region: Secondary | ICD-10-CM | POA: Diagnosis not present

## 2016-08-11 DIAGNOSIS — Z981 Arthrodesis status: Secondary | ICD-10-CM | POA: Diagnosis not present

## 2016-08-11 DIAGNOSIS — Z4789 Encounter for other orthopedic aftercare: Secondary | ICD-10-CM | POA: Diagnosis not present

## 2016-08-11 DIAGNOSIS — M1990 Unspecified osteoarthritis, unspecified site: Secondary | ICD-10-CM | POA: Diagnosis not present

## 2016-08-11 DIAGNOSIS — M109 Gout, unspecified: Secondary | ICD-10-CM | POA: Diagnosis not present

## 2016-08-11 DIAGNOSIS — I10 Essential (primary) hypertension: Secondary | ICD-10-CM | POA: Diagnosis not present

## 2016-08-12 DIAGNOSIS — M4316 Spondylolisthesis, lumbar region: Secondary | ICD-10-CM | POA: Diagnosis not present

## 2016-08-12 DIAGNOSIS — E119 Type 2 diabetes mellitus without complications: Secondary | ICD-10-CM | POA: Diagnosis not present

## 2016-08-12 DIAGNOSIS — I10 Essential (primary) hypertension: Secondary | ICD-10-CM | POA: Diagnosis not present

## 2016-08-12 DIAGNOSIS — M1990 Unspecified osteoarthritis, unspecified site: Secondary | ICD-10-CM | POA: Diagnosis not present

## 2016-08-12 DIAGNOSIS — Z4789 Encounter for other orthopedic aftercare: Secondary | ICD-10-CM | POA: Diagnosis not present

## 2016-08-12 DIAGNOSIS — M48061 Spinal stenosis, lumbar region without neurogenic claudication: Secondary | ICD-10-CM | POA: Diagnosis not present

## 2016-08-16 DIAGNOSIS — I10 Essential (primary) hypertension: Secondary | ICD-10-CM | POA: Diagnosis not present

## 2016-08-16 DIAGNOSIS — H905 Unspecified sensorineural hearing loss: Secondary | ICD-10-CM | POA: Diagnosis not present

## 2016-08-16 DIAGNOSIS — Z299 Encounter for prophylactic measures, unspecified: Secondary | ICD-10-CM | POA: Diagnosis not present

## 2016-08-16 DIAGNOSIS — M1990 Unspecified osteoarthritis, unspecified site: Secondary | ICD-10-CM | POA: Diagnosis not present

## 2016-08-16 DIAGNOSIS — M48061 Spinal stenosis, lumbar region without neurogenic claudication: Secondary | ICD-10-CM | POA: Diagnosis not present

## 2016-08-16 DIAGNOSIS — E119 Type 2 diabetes mellitus without complications: Secondary | ICD-10-CM | POA: Diagnosis not present

## 2016-08-16 DIAGNOSIS — Z6838 Body mass index (BMI) 38.0-38.9, adult: Secondary | ICD-10-CM | POA: Diagnosis not present

## 2016-08-16 DIAGNOSIS — E7800 Pure hypercholesterolemia, unspecified: Secondary | ICD-10-CM | POA: Diagnosis not present

## 2016-08-16 DIAGNOSIS — Z713 Dietary counseling and surveillance: Secondary | ICD-10-CM | POA: Diagnosis not present

## 2016-08-16 DIAGNOSIS — Z4789 Encounter for other orthopedic aftercare: Secondary | ICD-10-CM | POA: Diagnosis not present

## 2016-08-16 DIAGNOSIS — M549 Dorsalgia, unspecified: Secondary | ICD-10-CM | POA: Diagnosis not present

## 2016-08-16 DIAGNOSIS — M4316 Spondylolisthesis, lumbar region: Secondary | ICD-10-CM | POA: Diagnosis not present

## 2016-08-17 DIAGNOSIS — M1990 Unspecified osteoarthritis, unspecified site: Secondary | ICD-10-CM | POA: Diagnosis not present

## 2016-08-17 DIAGNOSIS — I10 Essential (primary) hypertension: Secondary | ICD-10-CM | POA: Diagnosis not present

## 2016-08-17 DIAGNOSIS — M4316 Spondylolisthesis, lumbar region: Secondary | ICD-10-CM | POA: Diagnosis not present

## 2016-08-17 DIAGNOSIS — Z4789 Encounter for other orthopedic aftercare: Secondary | ICD-10-CM | POA: Diagnosis not present

## 2016-08-17 DIAGNOSIS — E119 Type 2 diabetes mellitus without complications: Secondary | ICD-10-CM | POA: Diagnosis not present

## 2016-08-17 DIAGNOSIS — M48061 Spinal stenosis, lumbar region without neurogenic claudication: Secondary | ICD-10-CM | POA: Diagnosis not present

## 2016-08-18 DIAGNOSIS — E119 Type 2 diabetes mellitus without complications: Secondary | ICD-10-CM | POA: Diagnosis not present

## 2016-08-18 DIAGNOSIS — M4316 Spondylolisthesis, lumbar region: Secondary | ICD-10-CM | POA: Diagnosis not present

## 2016-08-18 DIAGNOSIS — I10 Essential (primary) hypertension: Secondary | ICD-10-CM | POA: Diagnosis not present

## 2016-08-18 DIAGNOSIS — M1990 Unspecified osteoarthritis, unspecified site: Secondary | ICD-10-CM | POA: Diagnosis not present

## 2016-08-18 DIAGNOSIS — Z4789 Encounter for other orthopedic aftercare: Secondary | ICD-10-CM | POA: Diagnosis not present

## 2016-08-18 DIAGNOSIS — M48061 Spinal stenosis, lumbar region without neurogenic claudication: Secondary | ICD-10-CM | POA: Diagnosis not present

## 2016-08-19 DIAGNOSIS — Z4789 Encounter for other orthopedic aftercare: Secondary | ICD-10-CM | POA: Diagnosis not present

## 2016-08-19 DIAGNOSIS — M4316 Spondylolisthesis, lumbar region: Secondary | ICD-10-CM | POA: Diagnosis not present

## 2016-08-19 DIAGNOSIS — I10 Essential (primary) hypertension: Secondary | ICD-10-CM | POA: Diagnosis not present

## 2016-08-19 DIAGNOSIS — E119 Type 2 diabetes mellitus without complications: Secondary | ICD-10-CM | POA: Diagnosis not present

## 2016-08-19 DIAGNOSIS — M48061 Spinal stenosis, lumbar region without neurogenic claudication: Secondary | ICD-10-CM | POA: Diagnosis not present

## 2016-08-19 DIAGNOSIS — M1990 Unspecified osteoarthritis, unspecified site: Secondary | ICD-10-CM | POA: Diagnosis not present

## 2016-08-20 DIAGNOSIS — M4316 Spondylolisthesis, lumbar region: Secondary | ICD-10-CM | POA: Diagnosis not present

## 2016-08-20 DIAGNOSIS — I10 Essential (primary) hypertension: Secondary | ICD-10-CM | POA: Diagnosis not present

## 2016-08-20 DIAGNOSIS — M48061 Spinal stenosis, lumbar region without neurogenic claudication: Secondary | ICD-10-CM | POA: Diagnosis not present

## 2016-08-20 DIAGNOSIS — E119 Type 2 diabetes mellitus without complications: Secondary | ICD-10-CM | POA: Diagnosis not present

## 2016-08-20 DIAGNOSIS — Z4789 Encounter for other orthopedic aftercare: Secondary | ICD-10-CM | POA: Diagnosis not present

## 2016-08-20 DIAGNOSIS — M1990 Unspecified osteoarthritis, unspecified site: Secondary | ICD-10-CM | POA: Diagnosis not present

## 2016-08-23 DIAGNOSIS — M48061 Spinal stenosis, lumbar region without neurogenic claudication: Secondary | ICD-10-CM | POA: Diagnosis not present

## 2016-08-23 DIAGNOSIS — M1990 Unspecified osteoarthritis, unspecified site: Secondary | ICD-10-CM | POA: Diagnosis not present

## 2016-08-23 DIAGNOSIS — M4316 Spondylolisthesis, lumbar region: Secondary | ICD-10-CM | POA: Diagnosis not present

## 2016-08-23 DIAGNOSIS — Z4789 Encounter for other orthopedic aftercare: Secondary | ICD-10-CM | POA: Diagnosis not present

## 2016-08-23 DIAGNOSIS — I10 Essential (primary) hypertension: Secondary | ICD-10-CM | POA: Diagnosis not present

## 2016-08-23 DIAGNOSIS — E119 Type 2 diabetes mellitus without complications: Secondary | ICD-10-CM | POA: Diagnosis not present

## 2016-08-24 DIAGNOSIS — E119 Type 2 diabetes mellitus without complications: Secondary | ICD-10-CM | POA: Diagnosis not present

## 2016-08-24 DIAGNOSIS — M4316 Spondylolisthesis, lumbar region: Secondary | ICD-10-CM | POA: Diagnosis not present

## 2016-08-24 DIAGNOSIS — I10 Essential (primary) hypertension: Secondary | ICD-10-CM | POA: Diagnosis not present

## 2016-08-24 DIAGNOSIS — M1990 Unspecified osteoarthritis, unspecified site: Secondary | ICD-10-CM | POA: Diagnosis not present

## 2016-08-24 DIAGNOSIS — M48061 Spinal stenosis, lumbar region without neurogenic claudication: Secondary | ICD-10-CM | POA: Diagnosis not present

## 2016-08-24 DIAGNOSIS — Z4789 Encounter for other orthopedic aftercare: Secondary | ICD-10-CM | POA: Diagnosis not present

## 2016-08-25 DIAGNOSIS — I10 Essential (primary) hypertension: Secondary | ICD-10-CM | POA: Diagnosis not present

## 2016-08-25 DIAGNOSIS — M48061 Spinal stenosis, lumbar region without neurogenic claudication: Secondary | ICD-10-CM | POA: Diagnosis not present

## 2016-08-25 DIAGNOSIS — E119 Type 2 diabetes mellitus without complications: Secondary | ICD-10-CM | POA: Diagnosis not present

## 2016-08-25 DIAGNOSIS — Z4789 Encounter for other orthopedic aftercare: Secondary | ICD-10-CM | POA: Diagnosis not present

## 2016-08-25 DIAGNOSIS — M1990 Unspecified osteoarthritis, unspecified site: Secondary | ICD-10-CM | POA: Diagnosis not present

## 2016-08-25 DIAGNOSIS — M4316 Spondylolisthesis, lumbar region: Secondary | ICD-10-CM | POA: Diagnosis not present

## 2016-08-26 DIAGNOSIS — M48061 Spinal stenosis, lumbar region without neurogenic claudication: Secondary | ICD-10-CM | POA: Diagnosis not present

## 2016-08-26 DIAGNOSIS — M4316 Spondylolisthesis, lumbar region: Secondary | ICD-10-CM | POA: Diagnosis not present

## 2016-08-26 DIAGNOSIS — Z4789 Encounter for other orthopedic aftercare: Secondary | ICD-10-CM | POA: Diagnosis not present

## 2016-08-26 DIAGNOSIS — E119 Type 2 diabetes mellitus without complications: Secondary | ICD-10-CM | POA: Diagnosis not present

## 2016-08-26 DIAGNOSIS — I10 Essential (primary) hypertension: Secondary | ICD-10-CM | POA: Diagnosis not present

## 2016-08-26 DIAGNOSIS — M1990 Unspecified osteoarthritis, unspecified site: Secondary | ICD-10-CM | POA: Diagnosis not present

## 2016-08-30 DIAGNOSIS — M4316 Spondylolisthesis, lumbar region: Secondary | ICD-10-CM | POA: Diagnosis not present

## 2016-08-30 DIAGNOSIS — Z4789 Encounter for other orthopedic aftercare: Secondary | ICD-10-CM | POA: Diagnosis not present

## 2016-08-30 DIAGNOSIS — I10 Essential (primary) hypertension: Secondary | ICD-10-CM | POA: Diagnosis not present

## 2016-08-30 DIAGNOSIS — M48061 Spinal stenosis, lumbar region without neurogenic claudication: Secondary | ICD-10-CM | POA: Diagnosis not present

## 2016-08-30 DIAGNOSIS — E119 Type 2 diabetes mellitus without complications: Secondary | ICD-10-CM | POA: Diagnosis not present

## 2016-08-30 DIAGNOSIS — M1990 Unspecified osteoarthritis, unspecified site: Secondary | ICD-10-CM | POA: Diagnosis not present

## 2016-09-01 DIAGNOSIS — M48061 Spinal stenosis, lumbar region without neurogenic claudication: Secondary | ICD-10-CM | POA: Diagnosis not present

## 2016-09-01 DIAGNOSIS — E119 Type 2 diabetes mellitus without complications: Secondary | ICD-10-CM | POA: Diagnosis not present

## 2016-09-01 DIAGNOSIS — M4316 Spondylolisthesis, lumbar region: Secondary | ICD-10-CM | POA: Diagnosis not present

## 2016-09-01 DIAGNOSIS — I10 Essential (primary) hypertension: Secondary | ICD-10-CM | POA: Diagnosis not present

## 2016-09-01 DIAGNOSIS — M1990 Unspecified osteoarthritis, unspecified site: Secondary | ICD-10-CM | POA: Diagnosis not present

## 2016-09-01 DIAGNOSIS — Z4789 Encounter for other orthopedic aftercare: Secondary | ICD-10-CM | POA: Diagnosis not present

## 2016-09-02 DIAGNOSIS — I10 Essential (primary) hypertension: Secondary | ICD-10-CM | POA: Diagnosis not present

## 2016-09-02 DIAGNOSIS — M48061 Spinal stenosis, lumbar region without neurogenic claudication: Secondary | ICD-10-CM | POA: Diagnosis not present

## 2016-09-02 DIAGNOSIS — Z4789 Encounter for other orthopedic aftercare: Secondary | ICD-10-CM | POA: Diagnosis not present

## 2016-09-02 DIAGNOSIS — M4316 Spondylolisthesis, lumbar region: Secondary | ICD-10-CM | POA: Diagnosis not present

## 2016-09-02 DIAGNOSIS — M1990 Unspecified osteoarthritis, unspecified site: Secondary | ICD-10-CM | POA: Diagnosis not present

## 2016-09-02 DIAGNOSIS — E119 Type 2 diabetes mellitus without complications: Secondary | ICD-10-CM | POA: Diagnosis not present

## 2016-09-07 DIAGNOSIS — I10 Essential (primary) hypertension: Secondary | ICD-10-CM | POA: Diagnosis not present

## 2016-09-07 DIAGNOSIS — M1990 Unspecified osteoarthritis, unspecified site: Secondary | ICD-10-CM | POA: Diagnosis not present

## 2016-09-07 DIAGNOSIS — Z4789 Encounter for other orthopedic aftercare: Secondary | ICD-10-CM | POA: Diagnosis not present

## 2016-09-07 DIAGNOSIS — E119 Type 2 diabetes mellitus without complications: Secondary | ICD-10-CM | POA: Diagnosis not present

## 2016-09-07 DIAGNOSIS — M4316 Spondylolisthesis, lumbar region: Secondary | ICD-10-CM | POA: Diagnosis not present

## 2016-09-07 DIAGNOSIS — M48061 Spinal stenosis, lumbar region without neurogenic claudication: Secondary | ICD-10-CM | POA: Diagnosis not present

## 2016-09-09 DIAGNOSIS — E119 Type 2 diabetes mellitus without complications: Secondary | ICD-10-CM | POA: Diagnosis not present

## 2016-09-09 DIAGNOSIS — I10 Essential (primary) hypertension: Secondary | ICD-10-CM | POA: Diagnosis not present

## 2016-09-09 DIAGNOSIS — M1990 Unspecified osteoarthritis, unspecified site: Secondary | ICD-10-CM | POA: Diagnosis not present

## 2016-09-09 DIAGNOSIS — Z4789 Encounter for other orthopedic aftercare: Secondary | ICD-10-CM | POA: Diagnosis not present

## 2016-09-09 DIAGNOSIS — M48061 Spinal stenosis, lumbar region without neurogenic claudication: Secondary | ICD-10-CM | POA: Diagnosis not present

## 2016-09-09 DIAGNOSIS — M4316 Spondylolisthesis, lumbar region: Secondary | ICD-10-CM | POA: Diagnosis not present

## 2016-09-13 DIAGNOSIS — M4316 Spondylolisthesis, lumbar region: Secondary | ICD-10-CM | POA: Diagnosis not present

## 2016-09-13 DIAGNOSIS — M48061 Spinal stenosis, lumbar region without neurogenic claudication: Secondary | ICD-10-CM | POA: Diagnosis not present

## 2016-09-13 DIAGNOSIS — M1990 Unspecified osteoarthritis, unspecified site: Secondary | ICD-10-CM | POA: Diagnosis not present

## 2016-09-13 DIAGNOSIS — I10 Essential (primary) hypertension: Secondary | ICD-10-CM | POA: Diagnosis not present

## 2016-09-13 DIAGNOSIS — E119 Type 2 diabetes mellitus without complications: Secondary | ICD-10-CM | POA: Diagnosis not present

## 2016-09-13 DIAGNOSIS — Z4789 Encounter for other orthopedic aftercare: Secondary | ICD-10-CM | POA: Diagnosis not present

## 2016-09-14 DIAGNOSIS — E119 Type 2 diabetes mellitus without complications: Secondary | ICD-10-CM | POA: Diagnosis not present

## 2016-09-14 DIAGNOSIS — Z4789 Encounter for other orthopedic aftercare: Secondary | ICD-10-CM | POA: Diagnosis not present

## 2016-09-14 DIAGNOSIS — M48061 Spinal stenosis, lumbar region without neurogenic claudication: Secondary | ICD-10-CM | POA: Diagnosis not present

## 2016-09-14 DIAGNOSIS — M1990 Unspecified osteoarthritis, unspecified site: Secondary | ICD-10-CM | POA: Diagnosis not present

## 2016-09-14 DIAGNOSIS — M4316 Spondylolisthesis, lumbar region: Secondary | ICD-10-CM | POA: Diagnosis not present

## 2016-09-14 DIAGNOSIS — I10 Essential (primary) hypertension: Secondary | ICD-10-CM | POA: Diagnosis not present

## 2016-09-15 DIAGNOSIS — M1990 Unspecified osteoarthritis, unspecified site: Secondary | ICD-10-CM | POA: Diagnosis not present

## 2016-09-15 DIAGNOSIS — E119 Type 2 diabetes mellitus without complications: Secondary | ICD-10-CM | POA: Diagnosis not present

## 2016-09-15 DIAGNOSIS — M4316 Spondylolisthesis, lumbar region: Secondary | ICD-10-CM | POA: Diagnosis not present

## 2016-09-15 DIAGNOSIS — Z4789 Encounter for other orthopedic aftercare: Secondary | ICD-10-CM | POA: Diagnosis not present

## 2016-09-15 DIAGNOSIS — I10 Essential (primary) hypertension: Secondary | ICD-10-CM | POA: Diagnosis not present

## 2016-09-15 DIAGNOSIS — M48061 Spinal stenosis, lumbar region without neurogenic claudication: Secondary | ICD-10-CM | POA: Diagnosis not present

## 2016-09-22 DIAGNOSIS — M1990 Unspecified osteoarthritis, unspecified site: Secondary | ICD-10-CM | POA: Diagnosis not present

## 2016-09-22 DIAGNOSIS — E119 Type 2 diabetes mellitus without complications: Secondary | ICD-10-CM | POA: Diagnosis not present

## 2016-09-22 DIAGNOSIS — Z4789 Encounter for other orthopedic aftercare: Secondary | ICD-10-CM | POA: Diagnosis not present

## 2016-09-22 DIAGNOSIS — M4316 Spondylolisthesis, lumbar region: Secondary | ICD-10-CM | POA: Diagnosis not present

## 2016-09-22 DIAGNOSIS — I10 Essential (primary) hypertension: Secondary | ICD-10-CM | POA: Diagnosis not present

## 2016-09-22 DIAGNOSIS — M48061 Spinal stenosis, lumbar region without neurogenic claudication: Secondary | ICD-10-CM | POA: Diagnosis not present

## 2016-09-24 DIAGNOSIS — Z299 Encounter for prophylactic measures, unspecified: Secondary | ICD-10-CM | POA: Diagnosis not present

## 2016-09-24 DIAGNOSIS — E1165 Type 2 diabetes mellitus with hyperglycemia: Secondary | ICD-10-CM | POA: Diagnosis not present

## 2016-09-24 DIAGNOSIS — I10 Essential (primary) hypertension: Secondary | ICD-10-CM | POA: Diagnosis not present

## 2016-09-24 DIAGNOSIS — E7800 Pure hypercholesterolemia, unspecified: Secondary | ICD-10-CM | POA: Diagnosis not present

## 2016-09-24 DIAGNOSIS — Z713 Dietary counseling and surveillance: Secondary | ICD-10-CM | POA: Diagnosis not present

## 2016-09-24 DIAGNOSIS — Z6837 Body mass index (BMI) 37.0-37.9, adult: Secondary | ICD-10-CM | POA: Diagnosis not present

## 2016-09-24 DIAGNOSIS — G2581 Restless legs syndrome: Secondary | ICD-10-CM | POA: Diagnosis not present

## 2016-09-24 DIAGNOSIS — E119 Type 2 diabetes mellitus without complications: Secondary | ICD-10-CM | POA: Diagnosis not present

## 2016-09-29 DIAGNOSIS — M1990 Unspecified osteoarthritis, unspecified site: Secondary | ICD-10-CM | POA: Diagnosis not present

## 2016-09-29 DIAGNOSIS — M48061 Spinal stenosis, lumbar region without neurogenic claudication: Secondary | ICD-10-CM | POA: Diagnosis not present

## 2016-09-29 DIAGNOSIS — E119 Type 2 diabetes mellitus without complications: Secondary | ICD-10-CM | POA: Diagnosis not present

## 2016-09-29 DIAGNOSIS — M4316 Spondylolisthesis, lumbar region: Secondary | ICD-10-CM | POA: Diagnosis not present

## 2016-09-29 DIAGNOSIS — Z4789 Encounter for other orthopedic aftercare: Secondary | ICD-10-CM | POA: Diagnosis not present

## 2016-09-29 DIAGNOSIS — I10 Essential (primary) hypertension: Secondary | ICD-10-CM | POA: Diagnosis not present

## 2016-10-06 DIAGNOSIS — G5712 Meralgia paresthetica, left lower limb: Secondary | ICD-10-CM | POA: Diagnosis not present

## 2016-10-06 DIAGNOSIS — M25562 Pain in left knee: Secondary | ICD-10-CM | POA: Diagnosis not present

## 2016-10-06 DIAGNOSIS — Z09 Encounter for follow-up examination after completed treatment for conditions other than malignant neoplasm: Secondary | ICD-10-CM | POA: Diagnosis not present

## 2016-10-06 DIAGNOSIS — M47816 Spondylosis without myelopathy or radiculopathy, lumbar region: Secondary | ICD-10-CM | POA: Diagnosis not present

## 2016-10-07 DIAGNOSIS — Z471 Aftercare following joint replacement surgery: Secondary | ICD-10-CM | POA: Diagnosis not present

## 2016-10-07 DIAGNOSIS — Z4789 Encounter for other orthopedic aftercare: Secondary | ICD-10-CM | POA: Diagnosis not present

## 2016-10-07 DIAGNOSIS — Z96652 Presence of left artificial knee joint: Secondary | ICD-10-CM | POA: Diagnosis not present

## 2016-10-07 DIAGNOSIS — M1990 Unspecified osteoarthritis, unspecified site: Secondary | ICD-10-CM | POA: Diagnosis not present

## 2016-10-07 DIAGNOSIS — I10 Essential (primary) hypertension: Secondary | ICD-10-CM | POA: Diagnosis not present

## 2016-10-07 DIAGNOSIS — E119 Type 2 diabetes mellitus without complications: Secondary | ICD-10-CM | POA: Diagnosis not present

## 2016-10-07 DIAGNOSIS — M48061 Spinal stenosis, lumbar region without neurogenic claudication: Secondary | ICD-10-CM | POA: Diagnosis not present

## 2016-10-07 DIAGNOSIS — M4316 Spondylolisthesis, lumbar region: Secondary | ICD-10-CM | POA: Diagnosis not present

## 2016-10-13 DIAGNOSIS — Z6837 Body mass index (BMI) 37.0-37.9, adult: Secondary | ICD-10-CM | POA: Diagnosis not present

## 2016-10-13 DIAGNOSIS — E7800 Pure hypercholesterolemia, unspecified: Secondary | ICD-10-CM | POA: Diagnosis not present

## 2016-10-13 DIAGNOSIS — H905 Unspecified sensorineural hearing loss: Secondary | ICD-10-CM | POA: Diagnosis not present

## 2016-10-13 DIAGNOSIS — M549 Dorsalgia, unspecified: Secondary | ICD-10-CM | POA: Diagnosis not present

## 2016-10-13 DIAGNOSIS — E1165 Type 2 diabetes mellitus with hyperglycemia: Secondary | ICD-10-CM | POA: Diagnosis not present

## 2016-10-13 DIAGNOSIS — D496 Neoplasm of unspecified behavior of brain: Secondary | ICD-10-CM | POA: Diagnosis not present

## 2016-10-13 DIAGNOSIS — I10 Essential (primary) hypertension: Secondary | ICD-10-CM | POA: Diagnosis not present

## 2016-10-13 DIAGNOSIS — Z299 Encounter for prophylactic measures, unspecified: Secondary | ICD-10-CM | POA: Diagnosis not present

## 2016-10-13 DIAGNOSIS — G5601 Carpal tunnel syndrome, right upper limb: Secondary | ICD-10-CM | POA: Diagnosis not present

## 2016-10-26 DIAGNOSIS — M4716 Other spondylosis with myelopathy, lumbar region: Secondary | ICD-10-CM | POA: Diagnosis not present

## 2016-11-17 DIAGNOSIS — Z96659 Presence of unspecified artificial knee joint: Secondary | ICD-10-CM | POA: Diagnosis not present

## 2016-11-17 DIAGNOSIS — M25562 Pain in left knee: Secondary | ICD-10-CM | POA: Diagnosis not present

## 2016-11-22 DIAGNOSIS — R937 Abnormal findings on diagnostic imaging of other parts of musculoskeletal system: Secondary | ICD-10-CM | POA: Diagnosis not present

## 2016-11-22 DIAGNOSIS — M25562 Pain in left knee: Secondary | ICD-10-CM | POA: Diagnosis not present

## 2016-11-22 DIAGNOSIS — Z96653 Presence of artificial knee joint, bilateral: Secondary | ICD-10-CM | POA: Diagnosis not present

## 2016-12-09 DIAGNOSIS — B369 Superficial mycosis, unspecified: Secondary | ICD-10-CM | POA: Diagnosis not present

## 2016-12-09 DIAGNOSIS — M4716 Other spondylosis with myelopathy, lumbar region: Secondary | ICD-10-CM | POA: Diagnosis not present

## 2016-12-14 DIAGNOSIS — G8929 Other chronic pain: Secondary | ICD-10-CM | POA: Diagnosis not present

## 2016-12-14 DIAGNOSIS — Z96653 Presence of artificial knee joint, bilateral: Secondary | ICD-10-CM | POA: Diagnosis not present

## 2016-12-14 DIAGNOSIS — M25562 Pain in left knee: Secondary | ICD-10-CM | POA: Diagnosis not present

## 2016-12-29 DIAGNOSIS — Z6837 Body mass index (BMI) 37.0-37.9, adult: Secondary | ICD-10-CM | POA: Diagnosis not present

## 2016-12-29 DIAGNOSIS — I10 Essential (primary) hypertension: Secondary | ICD-10-CM | POA: Diagnosis not present

## 2016-12-29 DIAGNOSIS — M1712 Unilateral primary osteoarthritis, left knee: Secondary | ICD-10-CM | POA: Diagnosis not present

## 2016-12-29 DIAGNOSIS — Z299 Encounter for prophylactic measures, unspecified: Secondary | ICD-10-CM | POA: Diagnosis not present

## 2016-12-29 DIAGNOSIS — E1165 Type 2 diabetes mellitus with hyperglycemia: Secondary | ICD-10-CM | POA: Diagnosis not present

## 2016-12-29 DIAGNOSIS — D496 Neoplasm of unspecified behavior of brain: Secondary | ICD-10-CM | POA: Diagnosis not present

## 2017-01-05 DIAGNOSIS — E119 Type 2 diabetes mellitus without complications: Secondary | ICD-10-CM | POA: Diagnosis not present

## 2017-01-05 DIAGNOSIS — Z961 Presence of intraocular lens: Secondary | ICD-10-CM | POA: Diagnosis not present

## 2017-01-05 DIAGNOSIS — H0230 Blepharochalasis unspecified eye, unspecified eyelid: Secondary | ICD-10-CM | POA: Diagnosis not present

## 2017-01-05 DIAGNOSIS — H26493 Other secondary cataract, bilateral: Secondary | ICD-10-CM | POA: Diagnosis not present

## 2017-01-13 DIAGNOSIS — Z299 Encounter for prophylactic measures, unspecified: Secondary | ICD-10-CM | POA: Diagnosis not present

## 2017-01-13 DIAGNOSIS — C44719 Basal cell carcinoma of skin of left lower limb, including hip: Secondary | ICD-10-CM | POA: Diagnosis not present

## 2017-01-13 DIAGNOSIS — Z6837 Body mass index (BMI) 37.0-37.9, adult: Secondary | ICD-10-CM | POA: Diagnosis not present

## 2017-01-13 DIAGNOSIS — K219 Gastro-esophageal reflux disease without esophagitis: Secondary | ICD-10-CM | POA: Diagnosis not present

## 2017-01-13 DIAGNOSIS — Z713 Dietary counseling and surveillance: Secondary | ICD-10-CM | POA: Diagnosis not present

## 2017-01-13 DIAGNOSIS — L989 Disorder of the skin and subcutaneous tissue, unspecified: Secondary | ICD-10-CM | POA: Diagnosis not present

## 2017-01-27 DIAGNOSIS — I10 Essential (primary) hypertension: Secondary | ICD-10-CM | POA: Diagnosis not present

## 2017-01-27 DIAGNOSIS — Z6837 Body mass index (BMI) 37.0-37.9, adult: Secondary | ICD-10-CM | POA: Diagnosis not present

## 2017-01-27 DIAGNOSIS — Z299 Encounter for prophylactic measures, unspecified: Secondary | ICD-10-CM | POA: Diagnosis not present

## 2017-01-27 DIAGNOSIS — C44712 Basal cell carcinoma of skin of right lower limb, including hip: Secondary | ICD-10-CM | POA: Diagnosis not present

## 2017-01-27 DIAGNOSIS — M159 Polyosteoarthritis, unspecified: Secondary | ICD-10-CM | POA: Diagnosis not present

## 2017-01-27 DIAGNOSIS — E119 Type 2 diabetes mellitus without complications: Secondary | ICD-10-CM | POA: Diagnosis not present

## 2017-05-05 ENCOUNTER — Ambulatory Visit: Payer: Self-pay | Admitting: Orthopedic Surgery

## 2017-06-29 NOTE — Patient Instructions (Addendum)
Olivia Powers  06/29/2017   Your procedure is scheduled on: Wednesday 07/06/2017  Report to [facility] Main  Entrance              Report to admitting at  [place]  AM    Call this number if you have problems the morning of surgery [phone]    Remember: Do not eat food or drink liquids :After Midnight.    How to Manage Your Diabetes Before and After Surgery  Why is it important to control my blood sugar before and after surgery? . Improving blood sugar levels before and after surgery helps healing and can limit problems. . A way of improving blood sugar control is eating a healthy diet by: o  Eating less sugar and carbohydrates o  Increasing activity/exercise o  Talking with your doctor about reaching your blood sugar goals . High blood sugars (greater than 180 mg/dL) can raise your risk of infections and slow your recovery, so you will need to focus on controlling your diabetes during the weeks before surgery. . Make sure that the doctor who takes care of your diabetes knows about your planned surgery including the date and location.  How do I manage my blood sugar before surgery? . Check your blood sugar at least 4 times a day, starting 2 days before surgery, to make sure that the level is not too high or low. o Check your blood sugar the morning of your surgery when you wake up and every 2 hours until you get to the Short Stay unit. . If your blood sugar is less than 70 mg/dL, you will need to treat for low blood sugar: o Do not take insulin. o Treat a low blood sugar (less than 70 mg/dL) with  cup of clear juice (cranberry or apple), 4 glucose tablets, OR glucose gel. o Recheck blood sugar in 15 minutes after treatment (to make sure it is greater than 70 mg/dL). If your blood sugar is not greater than 70 mg/dL on recheck, call [phone] for further instructions. . Report your blood sugar to the short stay nurse when you get to Short Stay.  . If you  are admitted to the hospital after surgery: o Your blood sugar will be checked by the staff and you will probably be given insulin after surgery (instead of oral diabetes medicines) to make sure you have good blood sugar levels. o The goal for blood sugar control after surgery is 80-180 mg/dL.   WHAT DO I DO ABOUT MY DIABETES MEDICATION?  Marland Kitchen Do not take oral diabetes medicines (pills) the morning of surgery.      Take these medicines the morning of surgery with A SIP OF WATER: Pantoprazole (Protonix), use eye drops, Allopurinol (Zyloprim)   DO NOT TAKE ANY DIABETIC MEDICATIONS DAY OF YOUR SURGERY                               You may not have any metal on your body including hair pins and              piercings  Do not wear jewelry, make-up, lotions, powders or perfumes, deodorant             Do not wear nail polish.  Do not shave  48 hours prior to surgery.  Do not bring valuables to the hospital. [place].  Contacts, dentures or bridgework may not be worn into surgery.  Leave suitcase in the car. After surgery it may be brought to your room.                  Please read over the following fact sheets you were given: _____________________________________________________________________             [facility] - Preparing for Surgery Before surgery, you can play an important role.  Because skin is not sterile, your skin needs to be as free of germs as possible.  You can reduce the number of germs on your skin by washing with CHG (chlorahexidine gluconate) soap before surgery.  CHG is an antiseptic cleaner which kills germs and bonds with the skin to continue killing germs even after washing. Please DO NOT use if you have an allergy to CHG or antibacterial soaps.  If your skin becomes reddened/irritated stop using the CHG and inform your nurse when you arrive at Short Stay. Do not shave (including legs and underarms) for  at least 48 hours prior to the first CHG shower.  You may shave your face/neck. Please follow these instructions carefully:  1.  Shower with CHG Soap the night before surgery and the  morning of Surgery.  2.  If you choose to wash your hair, wash your hair first as usual with your  normal  shampoo.  3.  After you shampoo, rinse your hair and body thoroughly to remove the  shampoo.                           4.  Use CHG as you would any other liquid soap.  You can apply chg directly  to the skin and wash                       Gently with a scrungie or clean washcloth.  5.  Apply the CHG Soap to your body ONLY FROM THE NECK DOWN.   Do not use on face/ open                           Wound or open sores. Avoid contact with eyes, ears mouth and genitals (private parts).                       Wash face,  Genitals (private parts) with your normal soap.             6.  Wash thoroughly, paying special attention to the area where your surgery  will be performed.  7.  Thoroughly rinse your body with warm water from the neck down.  8.  DO NOT shower/wash with your normal soap after using and rinsing off  the CHG Soap.                9.  Pat yourself dry with a clean towel.            10.  Wear clean pajamas.            11.  Place clean sheets on your bed the night of your first shower and do not  sleep with pets. Day of Surgery : Do not apply  any lotions/deodorants the morning of surgery.  Please wear clean clothes to the hospital/surgery center.  FAILURE TO FOLLOW THESE INSTRUCTIONS MAY RESULT IN THE CANCELLATION OF YOUR SURGERY PATIENT SIGNATURE_________________________________  NURSE SIGNATURE__________________________________  ________________________________________________________________________   Olivia Powers  An incentive spirometer is a tool that can help keep your lungs clear and active. This tool measures how well you are filling your lungs with each breath. Taking long deep  breaths may help reverse or decrease the chance of developing breathing (pulmonary) problems (especially infection) following:  A long period of time when you are unable to move or be active. BEFORE THE PROCEDURE   If the spirometer includes an indicator to show your best effort, your nurse or respiratory therapist will set it to a desired goal.  If possible, sit up straight or lean slightly forward. Try not to slouch.  Hold the incentive spirometer in an upright position. INSTRUCTIONS FOR USE  1. Sit on the edge of your bed if possible, or sit up as far as you can in bed or on a chair. 2. Hold the incentive spirometer in an upright position. 3. Breathe out normally. 4. Place the mouthpiece in your mouth and seal your lips tightly around it. 5. Breathe in slowly and as deeply as possible, raising the piston or the ball toward the top of the column. 6. Hold your breath for 3-5 seconds or for as long as possible. Allow the piston or ball to fall to the bottom of the column. 7. Remove the mouthpiece from your mouth and breathe out normally. 8. Rest for a few seconds and repeat Steps 1 through 7 at least 10 times every 1-2 hours when you are awake. Take your time and take a few normal breaths between deep breaths. 9. The spirometer may include an indicator to show your best effort. Use the indicator as a goal to work toward during each repetition. 10. After each set of 10 deep breaths, practice coughing to be sure your lungs are clear. If you have an incision (the cut made at the time of surgery), support your incision when coughing by placing a pillow or rolled up towels firmly against it. Once you are able to get out of bed, walk around indoors and cough well. You may stop using the incentive spirometer when instructed by your caregiver.  RISKS AND COMPLICATIONS  Take your time so you do not get dizzy or light-headed.  If you are in pain, you may need to take or ask for pain medication before  doing incentive spirometry. It is harder to take a deep breath if you are having pain. AFTER USE  Rest and breathe slowly and easily.  It can be helpful to keep track of a log of your progress. Your caregiver can provide you with a simple table to help with this. If you are using the spirometer at home, follow these instructions: [place] IF:   You are having difficultly using the spirometer.  You have trouble using the spirometer as often as instructed.  Your pain medication is not giving enough relief while using the spirometer.  You develop fever of 100.5 F (38.1 C) or higher. SEEK IMMEDIATE MEDICAL CARE IF:   You cough up bloody sputum that had not been present before.  You develop fever of 102 F (38.9 C) or greater.  You develop worsening pain at or near the incision site. MAKE SURE YOU:   Understand these instructions.  Will watch your condition.  Will  get help right away if you are not doing well or get worse. Document Released: 05/31/2006 Document Revised: 04/12/2011 Document Reviewed: 08/01/2006 ExitCare Patient Information 2014 Exit[employer], [place].   ________________________________________________________________________  WHAT IS A BLOOD TRANSFUSION? Blood Transfusion Information  A transfusion is the replacement of blood or some of its parts. Blood is made up of multiple cells which provide different functions.  Red blood cells carry oxygen and are used for blood loss replacement.  White blood cells fight against infection.  Platelets control bleeding.  Plasma helps clot blood.  Other blood products are available for specialized needs, such as hemophilia or other clotting disorders. BEFORE THE TRANSFUSION  Who gives blood for transfusions?   Healthy volunteers who are fully evaluated to make sure their blood is safe. This is blood bank blood. Transfusion therapy is the safest it has ever been in the practice of medicine. Before blood is taken  from a donor, a complete history is taken to make sure that person has no history of diseases nor engages in risky social behavior (examples are intravenous drug use or sexual activity with multiple partners). The donor's travel history is screened to minimize risk of transmitting infections, such as malaria. The donated blood is tested for signs of infectious diseases, such as HIV and hepatitis. The blood is then tested to be sure it is compatible with you in order to minimize the chance of a transfusion reaction. If you or a relative donates blood, this is often done in anticipation of surgery and is not appropriate for emergency situations. It takes many days to process the donated blood. RISKS AND COMPLICATIONS Although transfusion therapy is very safe and saves many lives, the main dangers of transfusion include:   Getting an infectious disease.  Developing a transfusion reaction. This is an allergic reaction to something in the blood you were given. Every precaution is taken to prevent this. The decision to have a blood transfusion has been considered carefully by your caregiver before blood is given. Blood is not given unless the benefits outweigh the risks. AFTER THE TRANSFUSION  Right after receiving a blood transfusion, you will usually feel much better and more energetic. This is especially true if your red blood cells have gotten low (anemic). The transfusion raises the level of the red blood cells which carry oxygen, and this usually causes an energy increase.  The nurse administering the transfusion will monitor you carefully for complications. HOME CARE INSTRUCTIONS  No special instructions are needed after a transfusion. You may find your energy is better. Speak with your caregiver about any limitations on activity for underlying diseases you may have. SEEK MEDICAL CARE IF:   Your condition is not improving after your transfusion.  You develop redness or irritation at the  intravenous (IV) site. SEEK IMMEDIATE MEDICAL CARE IF:  Any of the following symptoms occur over the next 12 hours:  Shaking chills.  You have a temperature by mouth above 102 F (38.9 C), not controlled by medicine.  Chest, back, or muscle pain.  People around you feel you are not acting correctly or are confused.  Shortness of breath or difficulty breathing.  Dizziness and fainting.  You get a rash or develop hives.  You have a decrease in urine output.  Your urine turns a dark color or changes to pink, red, or brown. Any of the following symptoms occur over the next 10 days:  You have a temperature by mouth above 102 F (38.9 C), not controlled by  medicine.  Shortness of breath.  Weakness after normal activity.  The white part of the eye turns yellow (jaundice).  You have a decrease in the amount of urine or are urinating less often.  Your urine turns a dark color or changes to pink, red, or brown. Document Released: 01/16/2000 Document Revised: 04/12/2011 Document Reviewed: 09/04/2007 [facility] Patient Information 2014 [place], [place].  _______________________________________________________________________

## 2017-07-01 ENCOUNTER — Other Ambulatory Visit: Payer: Self-pay

## 2017-07-01 ENCOUNTER — Encounter (HOSPITAL_COMMUNITY): Payer: Self-pay

## 2017-07-01 ENCOUNTER — Encounter (HOSPITAL_COMMUNITY)
Admission: RE | Admit: 2017-07-01 | Discharge: 2017-07-01 | Disposition: A | Discharge status: Home / Self Care | Payer: Medicare Other | Source: Ambulatory Visit | Attending: Orthopedic Surgery | Admitting: Orthopedic Surgery

## 2017-07-01 DIAGNOSIS — I4510 Unspecified right bundle-branch block: Secondary | ICD-10-CM | POA: Diagnosis not present

## 2017-07-01 DIAGNOSIS — Z7982 Long term (current) use of aspirin: Secondary | ICD-10-CM | POA: Insufficient documentation

## 2017-07-01 DIAGNOSIS — E119 Type 2 diabetes mellitus without complications: Secondary | ICD-10-CM | POA: Insufficient documentation

## 2017-07-01 DIAGNOSIS — Z79899 Other long term (current) drug therapy: Secondary | ICD-10-CM | POA: Insufficient documentation

## 2017-07-01 DIAGNOSIS — M1612 Unilateral primary osteoarthritis, left hip: Secondary | ICD-10-CM | POA: Insufficient documentation

## 2017-07-01 DIAGNOSIS — Z01818 Encounter for other preprocedural examination: Secondary | ICD-10-CM | POA: Insufficient documentation

## 2017-07-01 LAB — CBC
HCT: 44.3 % (ref 36.0–46.0)
Hemoglobin: 15 g/dL (ref 12.0–15.0)
MCH: 30.5 pg (ref 26.0–34.0)
MCHC: 33.9 g/dL (ref 30.0–36.0)
MCV: 90 fL (ref 78.0–100.0)
Platelets: 278 10*3/uL (ref 150–400)
RBC: 4.92 MIL/uL (ref 3.87–5.11)
RDW: 13.6 % (ref 11.5–15.5)
WBC: 7.2 10*3/uL (ref 4.0–10.5)

## 2017-07-01 LAB — COMPREHENSIVE METABOLIC PANEL
ALT: 17 U/L (ref 14–54)
AST: 19 U/L (ref 15–41)
Albumin: 4.6 g/dL (ref 3.5–5.0)
Alkaline Phosphatase: 79 U/L (ref 38–126)
Anion gap: 10 (ref 5–15)
BUN: 20 mg/dL (ref 6–20)
CALCIUM: 9.9 mg/dL (ref 8.9–10.3)
CO2: 26 mmol/L (ref 22–32)
CREATININE: 1 mg/dL (ref 0.44–1.00)
Chloride: 104 mmol/L (ref 101–111)
GFR calc non Af Amer: 52 mL/min — ABNORMAL LOW (ref >60)
GLUCOSE: 94 mg/dL (ref 65–99)
Potassium: 3.9 mmol/L (ref 3.5–5.1)
SODIUM: 140 mmol/L (ref 135–145)
Total Bilirubin: 0.7 mg/dL (ref 0.3–1.2)
Total Protein: 7.5 g/dL (ref 6.5–8.1)

## 2017-07-01 LAB — HEMOGLOBIN A1C
Hgb A1c MFr Bld: 5.5 % (ref 4.8–5.6)
Mean Plasma Glucose: 111.15 mg/dL

## 2017-07-01 LAB — SURGICAL PCR SCREEN
MRSA, PCR: NEGATIVE
STAPHYLOCOCCUS AUREUS: NEGATIVE

## 2017-07-01 LAB — GLUCOSE, CAPILLARY: Glucose-Capillary: 104 mg/dL — ABNORMAL HIGH (ref 65–99)

## 2017-07-01 LAB — APTT: aPTT: 27 seconds (ref 24–36)

## 2017-07-01 LAB — PROTIME-INR
INR: 1.01
Prothrombin Time: 13.2 seconds (ref 11.4–15.2)

## 2017-07-01 NOTE — H&P (Signed)
TOTAL HIP ADMISSION H&P  Patient is admitted for left total hip arthroplasty.  Subjective:  Chief Complaint: left hip pain  HPI: Olivia Powers, 77 y.o. female, has a history of pain and functional disability in the left hip(s) due to arthritis and patient has failed non-surgical conservative treatments for greater than 12 weeks to include NSAID's and/or analgesics, corticosteriod injections, flexibility and strengthening excercises and activity modification.  Onset of symptoms was gradual starting >10 years ago with gradually worsening course since that time.The patient noted no past surgery on the left hip(s).  Patient currently rates pain in the left hip at 7 out of 10 with activity. Patient has night pain, worsening of pain with activity and weight bearing, pain that interfers with activities of daily living and pain with passive range of motion. Patient has evidence of subchondral cysts, periarticular osteophytes and joint space narrowing by imaging studies. This condition presents safety issues increasing the risk of falls.  There is no current active infection.  Patient Active Problem List   Diagnosis Date Noted  . Acute upper GI bleed 04/22/2014  . Gout   . Hypertension   . Diabetes mellitus without complication [facility])    Past Medical History:  Diagnosis Date  . Alopecia   . Arthritis   . Cancer    skin cancer of chest melanoma  . Diabetes mellitus without complication    taken off Metformin by PCP this year.  . Gout   . 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 Hypercholesteremia   . Hypertension     Past Surgical History:  Procedure Laterality Date  . ABDOMINAL HYSTERECTOMY    . CATARACT EXTRACTION W/PHACO Left 02/11/2014   Procedure: CATARACT EXTRACTION PHACO AND INTRAOCULAR LENS PLACEMENT LEFT EYE;  Surgeon: Tonny Branch, MD;  Location: [facility];  Service: Ophthalmology;  Laterality: Left;  CDE:4.93  . CATARACT EXTRACTION W/PHACO Right 03/11/2014    Procedure: CATARACT EXTRACTION PHACO AND INTRAOCULAR LENS PLACEMENT RIGHT EYE CDE=8.52;  Surgeon: Tonny Branch, MD;  Location: [facility];  Service: Ophthalmology;  Laterality: Right;  . CERVICAL DISC SURGERY    . CESAREAN SECTION     x2  . CRANIECTOMY FOR EXCISION OF ACOUSTIC NEUROMA Left 1990, 1992  . ESOPHAGOGASTRODUODENOSCOPY N/A 04/22/2014   Procedure: ESOPHAGOGASTRODUODENOSCOPY (EGD);  Surgeon: [name], MD;  Location: [place];  Service: Endoscopy;  Laterality: N/A;  . TOTAL KNEE ARTHROPLASTY Bilateral 1990       Current Outpatient Medications  Medication Sig Dispense Refill Last Dose  . acetaminophen (TYLENOL) 500 MG tablet Take 1,000 mg by mouth 3 (three) times daily as needed for moderate pain or headache.      . allopurinol (ZYLOPRIM) 100 MG tablet Take 100 mg by mouth daily.   03/10/2014 at Unknown time  . diclofenac (VOLTAREN) 75 MG EC tablet Take 75 mg by mouth 2 (two) times daily as needed for moderate pain.    03/10/2014 at Unknown time  . diphenhydramine-acetaminophen (TYLENOL PM) 25-500 MG TABS tablet Take 2 tablets by mouth at bedtime as needed (sleep).     . losartan (COZAAR) 25 MG tablet Take 25 mg by mouth daily.     . Melatonin 3 MG TABS Take 3 mg by mouth at bedtime as needed (sleep).     . NYSTATIN powder Apply 1 application topically daily as needed for rash.  0   . pantoprazole (PROTONIX) 40 MG tablet Take 40 mg by mouth daily.     Marland Kitchen  polyethylene glycol (MIRALAX / GLYCOLAX) packet Take 17 g by mouth daily.     . simvastatin (ZOCOR) 20 MG tablet Take 20 mg by mouth every evening.     . trolamine salicylate (ASPERCREME) 10 % cream Apply 1 application topically as needed (leg pain).     Marland Kitchen omeprazole (PRILOSEC) 20 MG capsule Take 1 capsule (20 mg total) by mouth 2 (two) times daily before a meal. (Patient not taking: Reported on 06/30/2017) 60 capsule 1 Not Taking at Unknown time   Allergies  Allergen Reactions  . Codeine Nausea And Vomiting    GI upset   .  Tramadol     hallucinations     Social History   Tobacco Use  . Smoking status: Never Smoker  Substance Use Topics  . Alcohol use: No      Review of Systems  Constitutional: Negative.   HENT: Positive for hearing loss. Negative for congestion, ear discharge, ear pain, nosebleeds, sinus pain, sore throat and tinnitus.   Eyes: Negative.   Respiratory: Negative.  Negative for stridor.   Cardiovascular: Negative.   Gastrointestinal: Positive for constipation. Negative for abdominal pain, blood in stool, diarrhea, heartburn, melena, nausea and vomiting.  Genitourinary: Negative.   Musculoskeletal: Positive for back pain, joint pain and myalgias. Negative for falls and neck pain.  Skin: Negative.   Neurological: Negative.   Endo/Heme/Allergies: Negative for environmental allergies and polydipsia. Bruises/bleeds easily.  Psychiatric/Behavioral: Negative.     Objective:  Physical Exam  Constitutional: She is oriented to person, place, and time. She appears well-developed. No distress.  Obese  HENT:  Head: Normocephalic and atraumatic.  Right Ear: External ear normal.  Left Ear: External ear normal.  Nose: Nose normal.  Mouth/Throat: Oropharynx is clear and moist.  Eyes: Conjunctivae and EOM are normal.  Neck: Normal range of motion. Neck supple.  Cardiovascular: Normal rate, regular rhythm, normal heart sounds and intact distal pulses.  No murmur heard. Respiratory: Effort normal and breath sounds normal. No respiratory distress. She has no wheezes.  GI: Soft. Bowel sounds are normal. She exhibits no distension. There is no tenderness.  Musculoskeletal:  Examination of the right hip shows flexion to 120 rotation in 30 abduction 40 and external rotation of 40. There is no tenderness over the greater trochanter. There is no pain on provocative testing of the hip. Her RIGHT knee shows no swelling. Her range of motion is 0-110. There is no tenderness or instability. Her LEFT knee  shows no effusion. Her range of motion is 5-115. There is slight tenderness medially. There is no lateral tenderness or instability noted. Her LEFT hip can be flexed to 90 with no internal rotation. When I internally rotate the hip because the pain all the way down to her knee. She has about 10 of external rotation 10 of abduction. Gait pattern is significant antalgic on the LEFT.  Neurological: She is alert and oriented to person, place, and time. She has normal strength. No sensory deficit.  Skin: No rash noted. She is not diaphoretic. No erythema.  Psychiatric: She has a normal mood and affect. Her behavior is normal.    Ht: 5 ft  Wt: 170 lbs BMI: 33.2  BP: 144/72 sitting L arm  Pulse: 76 bpm regular   Imaging Review Plain radiographs demonstrate severe degenerative joint disease of the left hip(s). The bone quality appears to be good for age and reported activity level.    Preoperative templating of the joint replacement has been completed, documented,  and submitted to the Operating Room personnel in order to optimize intra-operative equipment management.     Assessment/Plan:  End stage primary osteoarthritis, left hip(s)  The patient history, physical examination, clinical judgement of the provider and imaging studies are consistent with end stage degenerative joint disease of the left hip(s) and total hip arthroplasty is deemed medically necessary. The treatment options including medical management, injection therapy, arthroscopy and arthroplasty were discussed at length. The risks and benefits of total hip arthroplasty were presented and reviewed. The risks due to aseptic loosening, infection, stiffness, dislocation/subluxation,  thromboembolic complications and other imponderables were discussed.  The patient acknowledged the explanation, agreed to proceed with the plan and consent was signed. Patient is being admitted for inpatient treatment for surgery, pain control, PT, OT,  prophylactic antibiotics, VTE prophylaxis, progressive ambulation and ADL's and discharge planning.The patient is planning to be discharged home  Therapy Plans: HHPT vs HEP Disposition: home with family Planned DVT prophylaxis: aspirin 325mg  BID DME needed: has equipment PCP: Dr. [name] Other: issues with vertigo  Ardeen Jourdain, PA-C

## 2017-07-01 NOTE — Progress Notes (Signed)
06/14/2017- Clearance note from Dr. [name] on chart.

## 2017-07-01 NOTE — Progress Notes (Signed)
   07/01/17 1342  OBSTRUCTIVE SLEEP APNEA  Have you ever been diagnosed with sleep apnea through a sleep study? No  Do you snore loudly (loud enough to be heard through closed doors)?  0  Do you often feel tired, fatigued, or sleepy during the daytime (such as falling asleep during driving or talking to someone)? 1  Has anyone observed you stop breathing during your sleep? 0  Do you have, or are you being treated for high blood pressure? 1  BMI more than 35 kg/m2? 1  Age > 50 (1-yes) 1  Neck circumference greater than:Female 16 inches or larger, Female 17inches or larger? 1  Female Gender (Yes=1) 0  Obstructive Sleep Apnea Score 5  Score 5 or greater  Results sent to PCP

## 2017-07-02 LAB — ABO/RH: ABO/RH(D): A POS

## 2017-07-05 ENCOUNTER — Encounter (HOSPITAL_COMMUNITY): Payer: Self-pay | Admitting: Anesthesiology

## 2017-07-05 MED ORDER — TRANEXAMIC ACID 1000 MG/10ML IV SOLN
1000.0000 mg | INTRAVENOUS | Status: DC
  Filled 2017-07-05 (×2): qty 10

## 2017-07-05 MED ORDER — TRANEXAMIC ACID 1000 MG/10ML IV SOLN
1000.0000 mg | INTRAVENOUS | Status: AC
  Administered 2017-07-06: 1000 mg via INTRAVENOUS
  Filled 2017-07-05: qty 1100

## 2017-07-05 NOTE — Anesthesia Preprocedure Evaluation (Addendum)
Anesthesia Evaluation  Patient identified by MRN, date of birth, ID band Patient awake    Reviewed: Allergy & Precautions, NPO status , Patient's Chart, lab work & pertinent test results  Airway Mallampati: III  TM Distance: >3 FB Neck ROM: Full    Dental  (+) Poor Dentition, Missing   Pulmonary neg pulmonary ROS,    Pulmonary exam normal breath sounds clear to auscultation       Cardiovascular hypertension, Pt. 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 negative psych ROS   GI/Hepatic negative GI ROS, Neg liver ROS,   Endo/Other  diabetes, Well Controlled, Type 2, Oral Hypoglycemic AgentsObesity Hypercholesterolemia  Renal/GU negative Renal ROS  negative genitourinary   Musculoskeletal  (+) Arthritis , Osteoarthritis,  OA left hip Hx/o previous lumbar surgery with hardware placement (rods)   Abdominal (+) + obese,   Peds  Hematology negative hematology ROS (+)   Anesthesia Other Findings   Reproductive/Obstetrics                         Anesthesia Physical Anesthesia Plan  ASA: III  Anesthesia Plan: Spinal   Post-op Pain Management:    Induction:   PONV Risk Score and Plan: 3 and Ondansetron, Propofol infusion and Treatment may vary due to age or medical condition  Airway Management Planned: Natural Airway, Nasal Cannula and Simple Face Mask  Additional Equipment:   Intra-op Plan:   Post-operative Plan:   Informed Consent: I have reviewed the patients History and Physical, chart, labs and discussed the procedure including the risks, benefits and alternatives for the proposed anesthesia with the patient or authorized representative who has indicated his/her understanding and acceptance.   Dental advisory given  Plan Discussed with: CRNA and Surgeon  Anesthesia Plan  Comments:        Anesthesia Quick Evaluation

## 2017-07-06 ENCOUNTER — Other Ambulatory Visit: Payer: Self-pay

## 2017-07-06 ENCOUNTER — Inpatient Hospital Stay (HOSPITAL_COMMUNITY): Payer: Medicare Other | Admitting: Certified Registered Nurse Anesthetist

## 2017-07-06 ENCOUNTER — Encounter (HOSPITAL_COMMUNITY)
Admission: RE | Disposition: A | Discharge status: Home Health | Payer: Self-pay | Source: Ambulatory Visit | Attending: Orthopedic Surgery

## 2017-07-06 ENCOUNTER — Encounter (HOSPITAL_COMMUNITY): Payer: Self-pay | Admitting: *Deleted

## 2017-07-06 ENCOUNTER — Inpatient Hospital Stay (HOSPITAL_COMMUNITY): Payer: Medicare Other

## 2017-07-06 ENCOUNTER — Inpatient Hospital Stay (HOSPITAL_COMMUNITY)
Admission: RE | Admit: 2017-07-06 | Discharge: 2017-07-08 | DRG: 470 | Disposition: A | Discharge status: Home Health | Payer: Medicare Other | Source: Ambulatory Visit | Attending: Orthopedic Surgery | Admitting: Orthopedic Surgery

## 2017-07-06 DIAGNOSIS — M1612 Unilateral primary osteoarthritis, left hip: Secondary | ICD-10-CM | POA: Diagnosis present

## 2017-07-06 DIAGNOSIS — Z85820 Personal history of malignant melanoma of skin: Secondary | ICD-10-CM

## 2017-07-06 DIAGNOSIS — E119 Type 2 diabetes mellitus without complications: Secondary | ICD-10-CM | POA: Diagnosis present

## 2017-07-06 DIAGNOSIS — Z79899 Other long term (current) drug therapy: Secondary | ICD-10-CM | POA: Diagnosis not present

## 2017-07-06 DIAGNOSIS — M109 Gout, unspecified: Secondary | ICD-10-CM | POA: Diagnosis present

## 2017-07-06 DIAGNOSIS — M169 Osteoarthritis of hip, unspecified: Secondary | ICD-10-CM | POA: Diagnosis present

## 2017-07-06 DIAGNOSIS — E7800 Pure hypercholesterolemia, unspecified: Secondary | ICD-10-CM | POA: Diagnosis present

## 2017-07-06 DIAGNOSIS — Z6834 Body mass index (BMI) 34.0-34.9, adult: Secondary | ICD-10-CM

## 2017-07-06 DIAGNOSIS — I10 Essential (primary) hypertension: Secondary | ICD-10-CM | POA: Diagnosis present

## 2017-07-06 DIAGNOSIS — H9190 Unspecified hearing loss, unspecified ear: Secondary | ICD-10-CM | POA: Diagnosis present

## 2017-07-06 DIAGNOSIS — Z96649 Presence of unspecified artificial hip joint: Secondary | ICD-10-CM

## 2017-07-06 HISTORY — PX: TOTAL HIP ARTHROPLASTY: SHX124

## 2017-07-06 LAB — GLUCOSE, CAPILLARY
GLUCOSE-CAPILLARY: 110 mg/dL — AB (ref 65–99)
Glucose-Capillary: 128 mg/dL — ABNORMAL HIGH (ref 65–99)

## 2017-07-06 LAB — TYPE AND SCREEN
ABO/RH(D): A POS
Antibody Screen: NEGATIVE

## 2017-07-06 SURGERY — ARTHROPLASTY, HIP, TOTAL, ANTERIOR APPROACH
Anesthesia: General | Site: Hip | Laterality: Left

## 2017-07-06 MED ORDER — STERILE WATER FOR IRRIGATION IR SOLN
Status: DC | PRN
  Administered 2017-07-06: 2000 mL

## 2017-07-06 MED ORDER — SODIUM CHLORIDE 0.9 % IV SOLN
INTRAVENOUS | Status: DC
  Administered 2017-07-06: 75 mL/h via INTRAVENOUS
  Administered 2017-07-07 – 2017-07-08 (×2): via INTRAVENOUS

## 2017-07-06 MED ORDER — DIPHENHYDRAMINE HCL 12.5 MG/5ML PO ELIX: 12.5000 mg | ORAL_SOLUTION | ORAL | Status: DC | PRN

## 2017-07-06 MED ORDER — OXYCODONE HCL 5 MG PO TABS
5.0000 mg | ORAL_TABLET | ORAL | Status: DC | PRN
  Administered 2017-07-06: 10 mg via ORAL
  Administered 2017-07-06 (×2): 5 mg via ORAL
  Administered 2017-07-07 – 2017-07-08 (×8): 10 mg via ORAL
  Filled 2017-07-06 (×10): qty 2

## 2017-07-06 MED ORDER — SIMVASTATIN 20 MG PO TABS
20.0000 mg | ORAL_TABLET | Freq: Every evening | ORAL | Status: DC
  Administered 2017-07-06: 20 mg via ORAL
  Filled 2017-07-06: qty 1

## 2017-07-06 MED ORDER — METOCLOPRAMIDE HCL 5 MG/ML IJ SOLN
5.0000 mg | Freq: Three times a day (TID) | INTRAMUSCULAR | Status: DC | PRN
  Administered 2017-07-06: 10 mg via INTRAVENOUS
  Filled 2017-07-06: qty 2

## 2017-07-06 MED ORDER — FLEET ENEMA 7-19 GM/118ML RE ENEM: 1.0000 | ENEMA | Freq: Once | RECTAL | Status: DC | PRN

## 2017-07-06 MED ORDER — CHLORHEXIDINE GLUCONATE 4 % EX LIQD: 60.0000 mL | Freq: Once | CUTANEOUS | Status: DC

## 2017-07-06 MED ORDER — ALLOPURINOL 100 MG PO TABS
100.0000 mg | ORAL_TABLET | Freq: Every day | ORAL | Status: DC
  Administered 2017-07-07: 100 mg via ORAL
  Filled 2017-07-06 (×2): qty 1

## 2017-07-06 MED ORDER — METOCLOPRAMIDE HCL 5 MG PO TABS: 5.0000 mg | ORAL_TABLET | Freq: Three times a day (TID) | ORAL | Status: DC | PRN

## 2017-07-06 MED ORDER — METOCLOPRAMIDE HCL 5 MG/ML IJ SOLN: 10.0000 mg | Freq: Once | INTRAMUSCULAR | Status: DC | PRN

## 2017-07-06 MED ORDER — PHENOL 1.4 % MT LIQD: 1.0000 | OROMUCOSAL | Status: DC | PRN

## 2017-07-06 MED ORDER — PROPOFOL 10 MG/ML IV BOLUS
INTRAVENOUS | Status: DC | PRN
  Administered 2017-07-06: 20 mg via INTRAVENOUS
  Administered 2017-07-06: 150 mg via INTRAVENOUS
  Administered 2017-07-06 (×2): 5 mg via INTRAVENOUS

## 2017-07-06 MED ORDER — ONDANSETRON HCL 4 MG/2ML IJ SOLN
INTRAMUSCULAR | Status: AC
  Filled 2017-07-06: qty 2

## 2017-07-06 MED ORDER — DOCUSATE SODIUM 100 MG PO CAPS
100.0000 mg | ORAL_CAPSULE | Freq: Two times a day (BID) | ORAL | Status: DC
  Administered 2017-07-06 – 2017-07-08 (×4): 100 mg via ORAL
  Filled 2017-07-06 (×4): qty 1

## 2017-07-06 MED ORDER — BISACODYL 10 MG RE SUPP: 10.0000 mg | Freq: Every day | RECTAL | Status: DC | PRN

## 2017-07-06 MED ORDER — ACETAMINOPHEN 500 MG PO TABS
1000.0000 mg | ORAL_TABLET | Freq: Four times a day (QID) | ORAL | Status: AC
  Administered 2017-07-06 – 2017-07-07 (×4): 1000 mg via ORAL
  Filled 2017-07-06 (×3): qty 2

## 2017-07-06 MED ORDER — PHENYLEPHRINE 40 MCG/ML (10ML) SYRINGE FOR IV PUSH (FOR BLOOD PRESSURE SUPPORT)
PREFILLED_SYRINGE | INTRAVENOUS | Status: AC
  Filled 2017-07-06: qty 10

## 2017-07-06 MED ORDER — MIDAZOLAM HCL 2 MG/2ML IJ SOLN
INTRAMUSCULAR | Status: AC
  Filled 2017-07-06: qty 2

## 2017-07-06 MED ORDER — BUPIVACAINE-EPINEPHRINE (PF) 0.25% -1:200000 IJ SOLN
INTRAMUSCULAR | Status: DC | PRN
  Administered 2017-07-06: 30 mL

## 2017-07-06 MED ORDER — PANTOPRAZOLE SODIUM 40 MG PO TBEC
40.0000 mg | DELAYED_RELEASE_TABLET | Freq: Every day | ORAL | Status: DC
  Administered 2017-07-07 – 2017-07-08 (×2): 40 mg via ORAL
  Filled 2017-07-06 (×2): qty 1

## 2017-07-06 MED ORDER — CEFAZOLIN SODIUM-DEXTROSE 2-4 GM/100ML-% IV SOLN
INTRAVENOUS | Status: AC
  Filled 2017-07-06: qty 100

## 2017-07-06 MED ORDER — FENTANYL CITRATE (PF) 100 MCG/2ML IJ SOLN
INTRAMUSCULAR | Status: DC | PRN
  Administered 2017-07-06 (×2): 25 ug via INTRAVENOUS
  Administered 2017-07-06 (×3): 50 ug via INTRAVENOUS

## 2017-07-06 MED ORDER — 0.9 % SODIUM CHLORIDE (POUR BTL) OPTIME
TOPICAL | Status: DC | PRN
  Administered 2017-07-06: 1000 mL

## 2017-07-06 MED ORDER — CEFAZOLIN SODIUM-DEXTROSE 1-4 GM/50ML-% IV SOLN
1.0000 g | Freq: Four times a day (QID) | INTRAVENOUS | Status: AC
  Administered 2017-07-06 (×2): 1 g via INTRAVENOUS
  Filled 2017-07-06 (×2): qty 50

## 2017-07-06 MED ORDER — ONDANSETRON HCL 4 MG PO TABS
4.0000 mg | ORAL_TABLET | Freq: Four times a day (QID) | ORAL | Status: DC | PRN
  Administered 2017-07-06: 4 mg via ORAL
  Filled 2017-07-06: qty 1

## 2017-07-06 MED ORDER — HYDROMORPHONE HCL 1 MG/ML IJ SOLN
0.2500 mg | INTRAMUSCULAR | Status: DC | PRN
  Administered 2017-07-06 (×3): 0.25 mg via INTRAVENOUS

## 2017-07-06 MED ORDER — BUPIVACAINE-EPINEPHRINE (PF) 0.25% -1:200000 IJ SOLN
INTRAMUSCULAR | Status: AC
  Filled 2017-07-06: qty 30

## 2017-07-06 MED ORDER — METHOCARBAMOL 1000 MG/10ML IJ SOLN
500.0000 mg | Freq: Four times a day (QID) | INTRAVENOUS | Status: DC | PRN
  Administered 2017-07-06: 500 mg via INTRAVENOUS
  Filled 2017-07-06: qty 550

## 2017-07-06 MED ORDER — ACETAMINOPHEN 325 MG PO TABS: 325.0000 mg | ORAL_TABLET | Freq: Four times a day (QID) | ORAL | Status: DC | PRN

## 2017-07-06 MED ORDER — TRANEXAMIC ACID 1000 MG/10ML IV SOLN
1000.0000 mg | Freq: Once | INTRAVENOUS | Status: AC
  Administered 2017-07-06: 1000 mg via INTRAVENOUS
  Filled 2017-07-06: qty 1100

## 2017-07-06 MED ORDER — MENTHOL 3 MG MT LOZG: 1.0000 | LOZENGE | OROMUCOSAL | Status: DC | PRN

## 2017-07-06 MED ORDER — ACETAMINOPHEN 10 MG/ML IV SOLN
INTRAVENOUS | Status: AC
  Filled 2017-07-06: qty 100

## 2017-07-06 MED ORDER — ONDANSETRON HCL 4 MG/2ML IJ SOLN
INTRAMUSCULAR | Status: DC | PRN
  Administered 2017-07-06: 4 mg via INTRAVENOUS

## 2017-07-06 MED ORDER — MEPERIDINE HCL 50 MG/ML IJ SOLN: 6.2500 mg | INTRAMUSCULAR | Status: DC | PRN

## 2017-07-06 MED ORDER — LACTATED RINGERS IV SOLN
INTRAVENOUS | Status: DC
  Administered 2017-07-06 (×2): via INTRAVENOUS

## 2017-07-06 MED ORDER — POLYETHYLENE GLYCOL 3350 17 G PO PACK
17.0000 g | PACK | Freq: Every day | ORAL | Status: DC | PRN
  Administered 2017-07-07: 17 g via ORAL
  Filled 2017-07-06: qty 1

## 2017-07-06 MED ORDER — CEFAZOLIN SODIUM-DEXTROSE 2-4 GM/100ML-% IV SOLN
2.0000 g | INTRAVENOUS | Status: AC
  Administered 2017-07-06: 2 g via INTRAVENOUS

## 2017-07-06 MED ORDER — ACETAMINOPHEN 10 MG/ML IV SOLN
1000.0000 mg | Freq: Once | INTRAVENOUS | Status: AC
  Administered 2017-07-06: 1000 mg via INTRAVENOUS

## 2017-07-06 MED ORDER — DEXAMETHASONE SODIUM PHOSPHATE 10 MG/ML IJ SOLN
10.0000 mg | Freq: Once | INTRAMUSCULAR | Status: AC
  Administered 2017-07-06: 10 mg via INTRAVENOUS

## 2017-07-06 MED ORDER — HYDROMORPHONE HCL 1 MG/ML IJ SOLN
INTRAMUSCULAR | Status: DC | PRN
  Administered 2017-07-06: 0.5 mg via INTRAVENOUS
  Administered 2017-07-06: 1 mg via INTRAVENOUS
  Administered 2017-07-06: 0.5 mg via INTRAVENOUS

## 2017-07-06 MED ORDER — HYDROMORPHONE HCL 1 MG/ML IJ SOLN
INTRAMUSCULAR | Status: AC
  Administered 2017-07-06: 0.25 mg via INTRAVENOUS
  Filled 2017-07-06: qty 1

## 2017-07-06 MED ORDER — ONDANSETRON HCL 4 MG/2ML IJ SOLN: 4.0000 mg | Freq: Four times a day (QID) | INTRAMUSCULAR | Status: DC | PRN

## 2017-07-06 MED ORDER — LOSARTAN POTASSIUM 25 MG PO TABS
25.0000 mg | ORAL_TABLET | Freq: Every day | ORAL | Status: DC
  Administered 2017-07-08: 25 mg via ORAL
  Filled 2017-07-06 (×2): qty 1

## 2017-07-06 MED ORDER — BUPIVACAINE IN DEXTROSE 0.75-8.25 % IT SOLN
INTRATHECAL | Status: DC | PRN
  Administered 2017-07-06: 1.6 mL via INTRATHECAL

## 2017-07-06 MED ORDER — PHENYLEPHRINE 40 MCG/ML (10ML) SYRINGE FOR IV PUSH (FOR BLOOD PRESSURE SUPPORT)
PREFILLED_SYRINGE | INTRAVENOUS | Status: DC | PRN
  Administered 2017-07-06: 80 ug via INTRAVENOUS

## 2017-07-06 MED ORDER — FENTANYL CITRATE (PF) 100 MCG/2ML IJ SOLN: 25.0000 ug | INTRAMUSCULAR | Status: DC | PRN

## 2017-07-06 MED ORDER — HYDROMORPHONE HCL 2 MG/ML IJ SOLN
INTRAMUSCULAR | Status: AC
  Filled 2017-07-06: qty 1

## 2017-07-06 MED ORDER — ASPIRIN EC 325 MG PO TBEC
325.0000 mg | DELAYED_RELEASE_TABLET | Freq: Every day | ORAL | Status: DC
  Administered 2017-07-07 – 2017-07-08 (×2): 325 mg via ORAL
  Filled 2017-07-06 (×2): qty 1

## 2017-07-06 MED ORDER — METHOCARBAMOL 500 MG PO TABS
500.0000 mg | ORAL_TABLET | Freq: Four times a day (QID) | ORAL | Status: DC | PRN
  Administered 2017-07-06: 500 mg via ORAL
  Filled 2017-07-06 (×2): qty 1

## 2017-07-06 MED ORDER — FENTANYL CITRATE (PF) 100 MCG/2ML IJ SOLN
INTRAMUSCULAR | Status: AC
  Filled 2017-07-06: qty 2

## 2017-07-06 MED ORDER — PROPOFOL 500 MG/50ML IV EMUL
INTRAVENOUS | Status: DC | PRN
  Administered 2017-07-06: 75 ug/kg/min via INTRAVENOUS

## 2017-07-06 MED ORDER — DEXAMETHASONE SODIUM PHOSPHATE 10 MG/ML IJ SOLN
10.0000 mg | Freq: Once | INTRAMUSCULAR | Status: AC
  Administered 2017-07-07: 10 mg via INTRAVENOUS
  Filled 2017-07-06: qty 1

## 2017-07-06 MED ORDER — PROPOFOL 10 MG/ML IV BOLUS
INTRAVENOUS | Status: AC
  Filled 2017-07-06: qty 40

## 2017-07-06 MED ORDER — HYDROMORPHONE HCL 1 MG/ML IJ SOLN: 0.5000 mg | INTRAMUSCULAR | Status: DC | PRN

## 2017-07-06 SURGICAL SUPPLY — 45 items
BAG ZIPLOCK 12X15 (MISCELLANEOUS) ×3 IMPLANT
BLADE EXTENDED COATED 6.5IN (ELECTRODE) ×3 IMPLANT
BLADE SAG 18X100X1.27 (BLADE) ×3 IMPLANT
CAPT HIP TOTAL 2 ×3 IMPLANT
CLOSURE WOUND 1/2 X4 (GAUZE/BANDAGES/DRESSINGS) ×2
CLOTH BEACON ORANGE TIMEOUT ST (SAFETY) ×3 IMPLANT
COVER PERINEAL POST (MISCELLANEOUS) ×3 IMPLANT
COVER SURGICAL LIGHT HANDLE (MISCELLANEOUS) ×3 IMPLANT
DECANTER SPIKE VIAL GLASS SM (MISCELLANEOUS) ×3 IMPLANT
DRAPE STERI IOBAN 125X83 (DRAPES) ×3 IMPLANT
DRAPE U-SHAPE 47X51 STRL (DRAPES) ×6 IMPLANT
DRSG ADAPTIC 3X8 NADH LF (GAUZE/BANDAGES/DRESSINGS) ×3 IMPLANT
DRSG MEPILEX BORDER 4X4 (GAUZE/BANDAGES/DRESSINGS) ×3 IMPLANT
DRSG MEPILEX BORDER 4X8 (GAUZE/BANDAGES/DRESSINGS) ×3 IMPLANT
DURAPREP 26ML APPLICATOR (WOUND CARE) ×3 IMPLANT
ELECT REM PT RETURN 15FT ADLT (MISCELLANEOUS) ×3 IMPLANT
EVACUATOR 1/8 PVC DRAIN (DRAIN) ×3 IMPLANT
GLOVE BIO SURGEON STRL SZ 6.5 (GLOVE) ×2 IMPLANT
GLOVE BIO SURGEON STRL SZ8 (GLOVE) ×6 IMPLANT
GLOVE BIO SURGEONS STRL SZ 6.5 (GLOVE) ×1
GLOVE BIOGEL PI IND STRL 6.5 (GLOVE) ×2 IMPLANT
GLOVE BIOGEL PI IND STRL 7.0 (GLOVE) ×2 IMPLANT
GLOVE BIOGEL PI IND STRL 7.5 (GLOVE) ×1 IMPLANT
GLOVE BIOGEL PI IND STRL 8 (GLOVE) ×3 IMPLANT
GLOVE BIOGEL PI INDICATOR 6.5 (GLOVE) ×4
GLOVE BIOGEL PI INDICATOR 7.0 (GLOVE) ×4
GLOVE BIOGEL PI INDICATOR 7.5 (GLOVE) ×2
GLOVE BIOGEL PI INDICATOR 8 (GLOVE) ×6
GLOVE ECLIPSE 8.0 STRL XLNG CF (GLOVE) ×3 IMPLANT
GLOVE SURG SS PI 6.5 STRL IVOR (GLOVE) ×6 IMPLANT
GLOVE SURG SS PI 7.0 STRL IVOR (GLOVE) ×3 IMPLANT
GLOVE SURG SS PI 8.0 STRL IVOR (GLOVE) ×3 IMPLANT
GOWN STRL REUS W/TWL 2XL LVL3 (GOWN DISPOSABLE) ×3 IMPLANT
GOWN STRL REUS W/TWL LRG LVL3 (GOWN DISPOSABLE) ×9 IMPLANT
GOWN STRL REUS W/TWL XL LVL3 (GOWN DISPOSABLE) ×3 IMPLANT
PACK ANTERIOR HIP CUSTOM (KITS) ×3 IMPLANT
STRIP CLOSURE SKIN 1/2X4 (GAUZE/BANDAGES/DRESSINGS) ×4 IMPLANT
SUT ETHIBOND NAB CT1 #1 30IN (SUTURE) ×3 IMPLANT
SUT MNCRL AB 4-0 PS2 18 (SUTURE) ×3 IMPLANT
SUT STRATAFIX 0 PDS 27 VIOLET (SUTURE) ×3
SUT VIC AB 2-0 CT1 27 (SUTURE) ×4
SUT VIC AB 2-0 CT1 TAPERPNT 27 (SUTURE) ×2 IMPLANT
SUTURE STRATFX 0 PDS 27 VIOLET (SUTURE) ×1 IMPLANT
TRAY FOLEY CATH 14FR (SET/KITS/TRAYS/PACK) ×3 IMPLANT
YANKAUER SUCT BULB TIP 10FT TU (MISCELLANEOUS) ×3 IMPLANT

## 2017-07-06 NOTE — Interval H&P Note (Signed)
History and Physical Interval Note:  07/06/2017 7:42 AM  Olivia Powers  has presented today for surgery, with the diagnosis of Osteoarthritis Left hip  The various methods of treatment have been discussed with the patient and family. After consideration of risks, benefits and other options for treatment, the patient has consented to  Procedure(s): LEFT TOTAL HIP ARTHROPLASTY ANTERIOR APPROACH (Left) as a surgical intervention .  The patient's history has been reviewed, patient examined, no change in status, stable for surgery.  I have reviewed the patient's chart and labs.  Questions were answered to the patient's satisfaction.     [name]

## 2017-07-06 NOTE — Anesthesia Procedure Notes (Signed)
Procedure Name: LMA Insertion Date/Time: 07/06/2017 8:46 AM Performed by: Maxwell Caul, CRNA Pre-anesthesia Checklist: Patient identified, Emergency Drugs available, Suction available and Patient being monitored Patient Re-evaluated:Patient Re-evaluated prior to induction Oxygen Delivery Method: Circle system utilized Preoxygenation: Pre-oxygenation with 100% oxygen Induction Type: IV induction LMA: LMA inserted LMA Size: 4.0 Number of attempts: 1 Placement Confirmation: positive ETCO2 and breath sounds checked- equal and bilateral Tube secured with: Tape Dental Injury: Teeth and Oropharynx as per pre-operative assessment  Comments: Dr Royce Macadamia at bedside during placement.

## 2017-07-06 NOTE — Transfer of Care (Signed)
Immediate Anesthesia Transfer of Care Note  Patient: Olivia Powers  Procedure(s) Performed: LEFT TOTAL HIP ARTHROPLASTY ANTERIOR APPROACH (Left Hip)  Patient Location: PACU  Anesthesia Type:General and Spinal  Level of Consciousness: awake, alert  and oriented  Airway & Oxygen Therapy: Patient Spontanous Breathing and Patient connected to face mask oxygen  Post-op Assessment: Report given to RN and Post -op Vital signs reviewed and stable  Post vital signs: Reviewed and stable  Last Vitals:  Vitals Value Taken Time  BP    Temp    Pulse    Resp    SpO2      Last Pain:  Vitals:   07/06/17 0654  TempSrc:   PainSc: 0-No pain      Patients Stated Pain Goal: 3 (81/27/51 7001)  Complications: No apparent anesthesia complications

## 2017-07-06 NOTE — Evaluation (Signed)
Physical Therapy Evaluation Patient Details Name: Olivia Powers MRN: [id number] DOB: 05-Nov-1940 Today's Date: 07/06/2017   History of Present Illness  Patient is a 77 y/o female admitted for L direct anterior THA.  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  Clinical Impression  Patient presents with decreased independence and safety with mobility due to L hip pain, decreased balance, decreased activity tolerance and generalized weakness.  She will benefit from skilled PT in the acute setting to allow return home following SNF level rehab stay.  Currently patient needing +2 A for safety for bed to chair.  Able to stand with walker and needs assist to move walker and L LE.  PT to follow.    Follow Up Recommendations SNF;Supervision/Assistance - 24 hour    Equipment Recommendations  None recommended by PT    Recommendations for Other Services       Precautions / Restrictions Precautions Precautions: Fall      Mobility  Bed Mobility Overal bed mobility: Needs Assistance Bed Mobility: Rolling;Sidelying to Sit Rolling: Min assist Sidelying to sit: HOB elevated;Mod assist;+2 for physical assistance       General bed mobility comments: assist for L LE, assist from behind for trunk  Transfers Overall transfer level: Needs assistance Equipment used: Rolling walker (2 wheeled) Transfers: Sit to/from [employer] Transfers Sit to Stand: +2 safety/equipment;Mod assist Stand pivot transfers: Mod assist;+2 safety/equipment       General transfer comment: lifting help from EOB; assist to move walker, to progress L LE, and for balance  Ambulation/Gait                Stairs            Wheelchair Mobility    Modified Rankin (Stroke Patients Only)       Balance Overall balance assessment: Needs assistance Sitting-balance support: Bilateral upper extremity  supported Sitting balance-Leahy Scale: Poor Sitting balance - Comments: UE support for balance at EOB   Standing balance support: Bilateral upper extremity supported Standing balance-Leahy Scale: Poor Standing balance comment: UE support for balance; reports bad equilibrium on L after two surgeries for acoustic neuroma                             Pertinent Vitals/Pain Pain Assessment: 0-10 Pain Score: 4  Pain Location: R hip  Pain Descriptors / Indicators: Grimacing;Guarding;Discomfort Pain Intervention(s): Monitored during session;Repositioned;Ice applied    Home Living Family/patient expects to be discharged to:: Private residence Living Arrangements: Alone Available Help at Discharge: Family;Available PRN/intermittently Type of Home: House       Home Layout: One level Home Equipment: Shower seat;Grab bars - tub/shower;Grab bars - toilet;Walker - 2 wheels;Cane - single point;Wheelchair - [employer] - 4 wheels Additional Comments: uses rollater in kitchen to [employer], uses w/c at times when difficulty to get around and needs both armrests to push up to stand    Prior Function Level of Independence: Independent with assistive device(s)         Comments: has housekeeper, drives to get groceries using one walker to get to car, then another already in the vehicle; has fallen      Hand Dominance        Extremity/Trunk Assessment   Upper Extremity Assessment Upper Extremity Assessment: Overall WFL for tasks assessed    Lower Extremity Assessment Lower Extremity Assessment:  LLE deficits/detail LLE Deficits / Details: assist for heel slides, difficulty progressing leg in standing, so hip flexors <2/5, ankle WFL, knee extension 3+/5       Communication   Communication: HOH  Cognition Arousal/Alertness: Awake/alert Behavior During Therapy: WFL for tasks assessed/performed Overall Cognitive Status: Within Functional Limits for tasks assessed                                         General Comments      Exercises Total Joint Exercises Ankle Circles/Pumps: AROM;10 reps;Both;Supine Quad Sets: AROM;10 reps;Supine;Left Heel Slides: AAROM;10 reps;Left;Supine   Assessment/Plan    PT Assessment Patient needs continued PT services  PT Problem List Decreased strength;Decreased mobility;Decreased activity tolerance;Decreased knowledge of use of DME;Decreased balance;Pain;Decreased range of motion       PT Treatment Interventions DME instruction;Functional mobility training;Balance training;Patient/family education;Therapeutic activities;Gait training;Therapeutic exercise;Stair training    PT Goals (Current goals can be found in the Care Plan section)  Acute Rehab PT Goals Patient Stated Goal: to return home if able PT Goal Formulation: With patient Time For Goal Achievement: 07/20/17 Potential to Achieve Goals: Good    Frequency 7X/week   Barriers to discharge        Co-evaluation               AM-PAC PT "6 Clicks" Daily Activity  Outcome Measure Difficulty turning over in bed (including adjusting bedclothes, sheets and blankets)?: Unable Difficulty moving from lying on back to sitting on the side of the bed? : Unable Difficulty sitting down on and standing up from a chair with arms (e.g., wheelchair, bedside commode, etc,.)?: Unable Help needed moving to and from a bed to chair (including a wheelchair)?: A Lot Help needed walking in hospital room?: Total Help needed climbing 3-5 steps with a railing? : Total 6 Click Score: 7    End of Session Equipment Utilized During Treatment: Gait belt Activity Tolerance: Patient limited by pain Patient left: with call bell/phone within reach;in chair;with chair alarm set   PT Visit Diagnosis: History of falling (Z91.81);Other abnormalities of gait and mobility (R26.89);Pain Pain - Right/Left: Left Pain - part of body: Hip    Time: 1700-1718 PT Time Calculation  (min) (ACUTE ONLY): 18 min   Charges:   PT Evaluation $PT Eval Moderate Complexity: 1 Mod     PT G CodesMagda Kiel, [place] [phone] 07/06/2017   Reginia Naas 07/06/2017, 5:38 PM

## 2017-07-06 NOTE — Anesthesia Postprocedure Evaluation (Signed)
Anesthesia Post Note  Patient: Olivia Powers  Procedure(s) Performed: LEFT TOTAL HIP ARTHROPLASTY ANTERIOR APPROACH (Left Hip)     Patient location during evaluation: PACU Anesthesia Type: General Level of consciousness: awake and alert and oriented Pain management: pain level controlled Vital Signs Assessment: post-procedure vital signs reviewed and stable Respiratory status: spontaneous breathing, nonlabored ventilation, respiratory function stable and patient connected to nasal cannula oxygen Cardiovascular status: blood pressure returned to baseline and stable Postop Assessment: no apparent nausea or vomiting Anesthetic complications: no Comments: Failed spinal.     Last Vitals:  Vitals:   07/06/17 1030 07/06/17 1045  BP: 131/71 138/69  Pulse: 82 84  Resp:  12  Temp:    SpO2: 100% 98%    Last Pain:  Vitals:   07/06/17 0654  TempSrc:   PainSc: 0-No pain                 Tran Randle A.

## 2017-07-06 NOTE — Anesthesia Procedure Notes (Signed)
Spinal  Patient location during procedure: OR Start time: 07/06/2017 8:13 AM Staffing Anesthesiologist: Josephine Igo, MD Performed: anesthesiologist  Preanesthetic Checklist Completed: patient identified, site marked, surgical consent, pre-op evaluation, timeout performed, IV checked, risks and benefits discussed and monitors and equipment checked Spinal Block Patient position: sitting Prep: site prepped and draped and DuraPrep Patient monitoring: heart rate, cardiac monitor, continuous pulse ox and blood pressure Approach: midline Location: L3-4 Injection technique: single-shot Needle Needle type: Pencan  Needle gauge: 24 G Needle length: 9 cm Needle insertion depth: 6 cm Assessment Sensory level: T4 Additional Notes Patient tolerated procedure well. Adequate sensory level.

## 2017-07-06 NOTE — Op Note (Signed)
OPERATIVE REPORT- TOTAL HIP ARTHROPLASTY   PREOPERATIVE DIAGNOSIS: Osteoarthritis of the Left hip.   POSTOPERATIVE DIAGNOSIS: Osteoarthritis of the Left  hip.   PROCEDURE: Left total hip arthroplasty, anterior approach.   SURGEON: Gaynelle Arabian, MD   ASSISTANT: Molli Barrows, PA-C  ANESTHESIA:  General  ESTIMATED BLOOD LOSS:-300 mL    DRAINS: Hemovac x1.   COMPLICATIONS: None   CONDITION: PACU - hemodynamically stable.   BRIEF CLINICAL NOTE: Olivia Powers is a 77 y.o. female who has advanced end-  stage arthritis of their Left  hip with progressively worsening pain and  dysfunction.The patient has failed nonoperative management and presents for  total hip arthroplasty.   PROCEDURE IN DETAIL: After successful administration of spinal  anesthetic, the traction boots for the [facility] bed were placed on both  feet and the patient was placed onto the [facility] bed, boots placed into the leg  holders. The Left hip was then isolated from the perineum with plastic  drapes and prepped and draped in the usual sterile fashion. ASIS and  greater trochanter were marked and a oblique incision was made, starting  at about 1 cm lateral and 2 cm distal to the ASIS and coursing towards  the anterior cortex of the femur. The skin was cut with a 10 blade  through subcutaneous tissue to the level of the fascia overlying the  tensor fascia lata muscle. The fascia was then incised in line with the  incision at the junction of the anterior third and posterior 2/3rd. The  muscle was teased off the fascia and then the interval between the TFL  and the rectus was developed. The Hohmann retractor was then placed at  the top of the femoral neck over the capsule. The vessels overlying the  capsule were cauterized and the fat on top of the capsule was removed.  A Hohmann retractor was then placed anterior underneath the rectus  femoris to give exposure to the entire anterior capsule. A T-shaped   capsulotomy was performed. The edges were tagged and the femoral head  was identified.       Osteophytes are removed off the superior acetabulum.  The femoral neck was then cut in situ with an oscillating saw. Traction  was then applied to the left lower extremity utilizing the [facility]  traction. The femoral head was then removed. Retractors were placed  around the acetabulum and then circumferential removal of the labrum was  performed. Osteophytes were also removed. Reaming starts at 45 mm to  medialize and  Increased in 2 mm increments to 47 mm. We reamed in  approximately 40 degrees of abduction, 20 degrees anteversion. A 48 mm  pinnacle acetabular shell was then impacted in anatomic position under  fluoroscopic guidance with excellent purchase. We did not need to place  any additional dome screws. A 28 mm neutral + 4 marathon liner was then  placed into the acetabular shell.       The femoral lift was then placed along the lateral aspect of the femur  just distal to the vastus ridge. The leg was  externally rotated and capsule  was stripped off the inferior aspect of the femoral neck down to the  level of the lesser trochanter, this was done with electrocautery. The femur was lifted after this was performed. The  leg was then placed in an extended and adducted position essentially delivering the femur. We also removed the capsule superiorly and the piriformis from the piriformis fossa  to gain excellent exposure of the  proximal femur. Rongeur was used to remove some cancellous bone to get  into the lateral portion of the proximal femur for placement of the  initial starter reamer. The starter broaches was placed  the starter broach  and was shown to go down the center of the canal. Broaching  with the  Corail system was then performed starting at size 8, coursing  Up to size 9. A size 9 had excellent torsional and rotational  and axial stability. The trial high offset neck was then placed   with a 28 + 1.5 trial head. The hip was then reduced. We confirmed that  the stem was in the canal both on AP and lateral x-rays. It also has excellent sizing. The hip was reduced with outstanding stability through full extension and full external rotation.. AP pelvis was taken and the leg lengths were measured and found to be equal. Hip was then dislocated again and the femoral head and neck removed. The  femoral broach was removed. Size 9 Corail stem with a igh offset  neck was then impacted into the femur following native anteversion. Has  excellent purchase in the canal. Excellent torsional and rotational and  axial stability. It is confirmed to be in the canal on AP and lateral  fluoroscopic views. The 28 + 1.5 metal head was placed and the hip  reduced with outstanding stability. Again AP pelvis was taken and it  confirmed that the leg lengths were equal. The wound was then copiously  irrigated with saline solution and the capsule reattached and repaired  with Ethibond suture. 30 ml of .25% Bupivicaine was  injected into the capsule and into the edge of the tensor fascia lata as well as subcutaneous tissue. The fascia overlying the tensor fascia lata was then closed with a running #1 V-Loc. Subcu was closed with interrupted 2-0 Vicryl and subcuticular running 4-0 Monocryl. Incision was cleaned  and dried. Steri-Strips and a bulky sterile dressing applied. Hemovac  drain was hooked to suction and then the patient was awakened and transported to  recovery in stable condition.        Please note that a surgical assistant was a medical necessity for this procedure to perform it in a safe and expeditious manner. Assistant was necessary to provide appropriate retraction of vital neurovascular structures and to prevent femoral fracture and allow for anatomic placement of the prosthesis.  Frank Aluisio, M.D.     

## 2017-07-06 NOTE — Discharge Instructions (Signed)
° °Dr. Frank Aluisio °Total Joint Specialist °[facility] °[address] °[place], [place] [address] °[phone] ° °ANTERIOR APPROACH TOTAL HIP REPLACEMENT POSTOPERATIVE DIRECTIONS ° ° °Hip Rehabilitation, Guidelines Following Surgery  °The results of a hip operation are greatly improved after range of motion and muscle strengthening exercises. Follow all safety measures which are given to protect your hip. If any of these exercises cause increased pain or swelling in your joint, decrease the amount until you are comfortable again. Then slowly increase the exercises. Call your caregiver if you have problems or questions.  ° °HOME CARE INSTRUCTIONS  °Remove items at home which could result in a fall. This includes throw rugs or furniture in walking pathways.  °· ICE to the affected hip every three hours for 30 minutes at a time and then as needed for pain and swelling.  Continue to use ice on the hip for pain and swelling from surgery. You may notice swelling that will progress down to the foot and ankle.  This is normal after surgery.  Elevate the leg when you are not up walking on it.   °· Continue to use the breathing machine which will help keep your temperature down.  It is common for your temperature to cycle up and down following surgery, especially at night when you are not up moving around and exerting yourself.  The breathing machine keeps your lungs expanded and your temperature down. ° ° °DIET °You may resume your previous home diet once your are discharged from the hospital. ° °DRESSING / WOUND CARE / SHOWERING °You may change your dressing every day with sterile gauze.  Please use good hand washing techniques before changing the dressing.  Do not use any lotions or creams on the incision until instructed by your surgeon. °You may start showering once you are discharged home but do not submerge the incision under water. Just pat the incision dry and apply a dry gauze dressing on  daily. °Change the surgical dressing daily and reapply a dry dressing each time. ° °ACTIVITY °Walk with your walker as instructed. °Use walker as long as suggested by your caregivers. °Avoid periods of inactivity such as sitting longer than an hour when not asleep. This helps prevent blood clots.  °You may resume a sexual relationship in one month or when given the OK by your doctor.  °You may return to work once you are cleared by your doctor.  °Do not drive a car for 6 weeks or until released by you surgeon.  °Do not drive while taking narcotics. ° °WEIGHT BEARING °Weight bearing as tolerated with assist device (walker, cane, etc) as directed, use it as long as suggested by your surgeon or therapist, typically at least 4-6 weeks. ° °POSTOPERATIVE CONSTIPATION PROTOCOL °Constipation - defined medically as fewer than three stools per week and severe constipation as less than one stool per week. ° °One of the most common issues patients have following surgery is constipation.  Even if you have a regular bowel pattern at home, your normal regimen is likely to be disrupted due to multiple reasons following surgery.  Combination of anesthesia, postoperative narcotics, change in appetite and fluid intake all can affect your bowels.  In order to avoid complications following surgery, here are some recommendations in order to help you during your recovery period. ° °Colace (docusate) - Pick up an over-the-counter form of Colace or another stool softener and take twice a day as long as you are requiring postoperative pain   medications.  Take with a full glass of water daily.  If you experience loose stools or diarrhea, hold the colace until you stool forms back up.  If your symptoms do not get better within 1 week or if they get worse, check with your doctor. ° °Dulcolax (bisacodyl) - Pick up over-the-counter and take as directed by the product packaging as needed to assist with the movement of your bowels.  Take with a full  glass of water.  Use this product as needed if not relieved by Colace only.  ° °MiraLax (polyethylene glycol) - Pick up over-the-counter to have on hand.  MiraLax is a solution that will increase the amount of water in your bowels to assist with bowel movements.  Take as directed and can mix with a glass of water, juice, soda, coffee, or tea.  Take if you go more than two days without a movement. °Do not use MiraLax more than once per day. Call your doctor if you are still constipated or irregular after using this medication for 7 days in a row. ° °If you continue to have problems with postoperative constipation, please contact the office for further assistance and recommendations.  If you experience "the worst abdominal pain ever" or develop nausea or vomiting, please contact the office immediatly for further recommendations for treatment. ° °ITCHING ° If you experience itching with your medications, try taking only a single pain pill, or even half a pain pill at a time.  You can also use Benadryl over the counter for itching or also to help with sleep.  ° °TED HOSE STOCKINGS °Wear the elastic stockings on both legs for three weeks following surgery during the day but you may remove then at night for sleeping. ° °MEDICATIONS °See your medication summary on the “After Visit Summary” that the nursing staff will review with you prior to discharge.  You may have some home medications which will be placed on hold until you complete the course of blood thinner medication.  It is important for you to complete the blood thinner medication as prescribed by your surgeon.  Continue your approved medications as instructed at time of discharge. ° °PRECAUTIONS °If you experience chest pain or shortness of breath - call 911 immediately for transfer to the hospital emergency department.  °If you develop a fever greater that 101 F, purulent drainage from wound, increased redness or drainage from wound, foul odor from the  wound/dressing, or calf pain - CONTACT YOUR SURGEON.   °                                                °FOLLOW-UP APPOINTMENTS °Make sure you keep all of your appointments after your operation with your surgeon and caregivers. You should call the office at the above phone number and make an appointment for approximately two weeks after the date of your surgery or on the date instructed by your surgeon outlined in the "After Visit Summary". ° °RANGE OF MOTION AND STRENGTHENING EXERCISES  °These exercises are designed to help you keep full movement of your hip joint. Follow your caregiver's or physical therapist's instructions. Perform all exercises about fifteen times, three times per day or as directed. Exercise both hips, even if you have had only one joint replacement. These exercises can be done on a training (exercise) mat, on the floor, on   a table or on a bed. Use whatever works the best and is most comfortable for you. Use music or television while you are exercising so that the exercises are a pleasant break in your day. This will make your life better with the exercises acting as a break in routine you can look forward to.  °Lying on your back, slowly slide your foot toward your buttocks, raising your knee up off the floor. Then slowly slide your foot back down until your leg is straight again.  °Lying on your back spread your legs as far apart as you can without causing discomfort.  °Lying on your side, raise your upper leg and foot straight up from the floor as far as is comfortable. Slowly lower the leg and repeat.  °Lying on your back, tighten up the muscle in the front of your thigh (quadriceps muscles). You can do this by keeping your leg straight and trying to raise your heel off the floor. This helps strengthen the largest muscle supporting your knee.  °Lying on your back, tighten up the muscles of your buttocks both with the legs straight and with the knee bent at a comfortable angle while keeping  your heel on the floor.  ° °IF YOU ARE TRANSFERRED TO A SKILLED REHAB FACILITY °If the patient is transferred to a skilled rehab facility following release from the hospital, a list of the current medications will be sent to the facility for the patient to continue.  When discharged from the skilled rehab facility, please have the facility set up the patient's Home Health Physical Therapy prior to being released. Also, the skilled facility will be responsible for providing the patient with their medications at time of release from the facility to include their pain medication, the muscle relaxants, and their blood thinner medication. If the patient is still at the rehab facility at time of the two week follow up appointment, the skilled rehab facility will also need to assist the patient in arranging follow up appointment in our office and any transportation needs. ° °MAKE SURE YOU:  °Understand these instructions.  °Get help right away if you are not doing well or get worse.  ° ° °Pick up stool softner and laxative for home use following surgery while on pain medications. °Do not submerge incision under water. °Please use good hand washing techniques while changing dressing each day. °May shower starting three days after surgery. °Please use a clean towel to pat the incision dry following showers. °Continue to use ice for pain and swelling after surgery. °Do not use any lotions or creams on the incision until instructed by your surgeon. °

## 2017-07-07 ENCOUNTER — Encounter (HOSPITAL_COMMUNITY): Payer: Self-pay | Admitting: Orthopedic Surgery

## 2017-07-07 LAB — BASIC METABOLIC PANEL
Anion gap: 6 (ref 5–15)
BUN: 21 mg/dL — AB (ref 6–20)
CO2: 26 mmol/L (ref 22–32)
Calcium: 9 mg/dL (ref 8.9–10.3)
Chloride: 109 mmol/L (ref 101–111)
Creatinine, Ser: 0.96 mg/dL (ref 0.44–1.00)
GFR calc Af Amer: >60 mL/min (ref >60)
GFR calc non Af Amer: 55 mL/min — ABNORMAL LOW (ref >60)
GLUCOSE: 120 mg/dL — AB (ref 65–99)
POTASSIUM: 4.2 mmol/L (ref 3.5–5.1)
Sodium: 141 mmol/L (ref 135–145)

## 2017-07-07 LAB — CBC
HCT: 33.5 % — ABNORMAL LOW (ref 36.0–46.0)
Hemoglobin: 10.8 g/dL — ABNORMAL LOW (ref 12.0–15.0)
MCH: 29 pg (ref 26.0–34.0)
MCHC: 32.2 g/dL (ref 30.0–36.0)
MCV: 89.8 fL (ref 78.0–100.0)
Platelets: 247 K/uL (ref 150–400)
RBC: 3.73 MIL/uL — ABNORMAL LOW (ref 3.87–5.11)
RDW: 13.6 % (ref 11.5–15.5)
WBC: 13.8 K/uL — ABNORMAL HIGH (ref 4.0–10.5)

## 2017-07-07 MED ORDER — ASPIRIN 325 MG PO TBEC: 325.0000 mg | DELAYED_RELEASE_TABLET | Freq: Two times a day (BID) | ORAL | 0 refills | Status: DC

## 2017-07-07 MED ORDER — METHOCARBAMOL 500 MG PO TABS: 500.0000 mg | ORAL_TABLET | Freq: Four times a day (QID) | ORAL | 0 refills | Status: DC | PRN

## 2017-07-07 MED ORDER — OXYCODONE HCL 5 MG PO TABS: 5.0000 mg | ORAL_TABLET | Freq: Four times a day (QID) | ORAL | 0 refills | Status: DC | PRN

## 2017-07-07 NOTE — NC FL2 (Addendum)
[place] LEVEL OF CARE SCREENING TOOL     IDENTIFICATION  Patient Name: Olivia Powers Birthdate: 1940-03-02 Sex: female Admission Date (Current Location): 07/06/2017  [facility] and [place] Number:   Herbalist and Address:  [facility],  [facility] [address], [place]      Provider Number: [id number]  Attending Physician Name and Address:  Gaynelle Arabian, MD  Relative Name and Phone Number:       Current Level of Care: SNF Recommended Level of Care: [place] Prior Approval Number:    Date Approved/Denied:   PASRR Number:  [id number]   Discharge Plan: SNF    Current Diagnoses: Patient Active Problem List   Diagnosis Date Noted  . OA (osteoarthritis) of hip 07/06/2017  . Acute upper GI bleed 04/22/2014  . Gout   . Hypertension   . Diabetes mellitus without complication (HCC)     Orientation RESPIRATION BLADDER Height & Weight     Self, Time, Situation, Place  O2(2L) Continent Weight: 167 lb (75.8 kg) Height:  4\' 10"  (147.3 cm)  BEHAVIORAL SYMPTOMS/MOOD NEUROLOGICAL BOWEL NUTRITION STATUS      Continent Diet(Low Sodium/Heart Healthy )  AMBULATORY STATUS COMMUNICATION OF NEEDS Skin   Extensive Assist Verbally Surgical wounds                       Personal Care Assistance Level of Assistance  Bathing, Feeding, Dressing Bathing Assistance: Limited assistance Feeding assistance: Independent Dressing Assistance: Limited assistance     Functional Limitations Info  Sight, Hearing, Speech Sight Info: Impaired(Impaired) Hearing Info: Adequate Speech Info: Adequate    SPECIAL CARE FACTORS FREQUENCY  PT (By licensed PT), OT (By licensed OT)     PT Frequency: 7x/week OT Frequency: 7x/week            Contractures Contractures Info: Not present    Additional Factors Info  Code Status, Allergies Code Status Info: Fullcode Allergies Info: Allergies: Codeine, Tramadol           Current  Medications (07/07/2017):  This is the current hospital active medication list Current Facility-Administered Medications  Medication Dose Route Frequency Provider Last Rate Last Dose  . 0.9 %  sodium chloride infusion   Intravenous Continuous Gaynelle Arabian, MD 30 mL/hr at 07/07/17 6834    . acetaminophen (TYLENOL) tablet 325-650 mg  325-650 mg Oral Q6H PRN Aluisio, Pilar Plate, MD      . allopurinol (ZYLOPRIM) tablet 100 mg  100 mg Oral Daily Gaynelle Arabian, MD   100 mg at 07/07/17 0910  . aspirin EC tablet 325 mg  325 mg Oral Daily Gaynelle Arabian, MD   325 mg at 07/07/17 0911  . bisacodyl (DULCOLAX) suppository 10 mg  10 mg Rectal Daily PRN Gaynelle Arabian, MD      . diphenhydrAMINE (BENADRYL) 12.5 MG/5ML elixir 12.5-25 mg  12.5-25 mg Oral Q4H PRN Aluisio, Pilar Plate, MD      . docusate sodium (COLACE) capsule 100 mg  100 mg Oral BID Gaynelle Arabian, MD   100 mg at 07/07/17 0911  . HYDROmorphone (DILAUDID) injection 0.5-1 mg  0.5-1 mg Intravenous Q4H PRN Aluisio, Pilar Plate, MD      . losartan (COZAAR) tablet 25 mg  25 mg Oral Daily Gaynelle Arabian, MD   Stopped at 07/07/17 [phone]  . menthol-cetylpyridinium (CEPACOL) lozenge 3 mg  1 lozenge Oral PRN Aluisio, Pilar Plate, MD       Or  . phenol (CHLORASEPTIC) mouth spray 1  spray  1 spray Mouth/Throat PRN Aluisio, Pilar Plate, MD      . methocarbamol (ROBAXIN) tablet 500 mg  500 mg Oral Q6H PRN Gaynelle Arabian, MD   500 mg at 07/06/17 2147   Or  . methocarbamol (ROBAXIN) 500 mg in dextrose 5 % 50 mL IVPB  500 mg Intravenous Q6H PRN Gaynelle Arabian, MD   Stopped at 07/06/17 1053  . metoCLOPramide (REGLAN) tablet 5-10 mg  5-10 mg Oral Q8H PRN Aluisio, Pilar Plate, MD       Or  . metoCLOPramide (REGLAN) injection 5-10 mg  5-10 mg Intravenous Q8H PRN Gaynelle Arabian, MD   10 mg at 07/06/17 1228  . ondansetron (ZOFRAN) tablet 4 mg  4 mg Oral Q6H PRN Gaynelle Arabian, MD   4 mg at 07/06/17 1538   Or  . ondansetron (ZOFRAN) injection 4 mg  4 mg Intravenous Q6H PRN Aluisio, Pilar Plate, MD      .  oxyCODONE (Oxy IR/ROXICODONE) immediate release tablet 5-10 mg  5-10 mg Oral Q4H PRN Gaynelle Arabian, MD   10 mg at 07/07/17 0910  . pantoprazole (PROTONIX) EC tablet 40 mg  40 mg Oral Daily Gaynelle Arabian, MD   40 mg at 07/07/17 0909  . polyethylene glycol (MIRALAX / GLYCOLAX) packet 17 g  17 g Oral Daily PRN Gaynelle Arabian, MD   17 g at 07/07/17 0912  . simvastatin (ZOCOR) tablet 20 mg  20 mg Oral QPM Gaynelle Arabian, MD   20 mg at 07/06/17 1817  . sodium phosphate (FLEET) 7-19 GM/118ML enema 1 enema  1 enema Rectal Once PRN Gaynelle Arabian, MD         Discharge Medications: Please see discharge summary for a list of discharge medications.  Relevant Imaging Results:  Relevant Lab Results:   Additional Information ssn:[id number]  Lia Hopping, LCSW

## 2017-07-07 NOTE — Clinical Social Work Note (Signed)
Clinical Social Work Assessment  Patient Details  Name: Olivia Powers MRN: [id number] Date of Birth: 1940-08-06  Date of referral:  07/07/17               Reason for consult:  Facility Placement                Permission sought to share information with:  Facility Sport and exercise psychologist, Family Supports Permission granted to share information::  Yes, Verbal Permission Granted  Name::        Agency::  SNF  Relationship::  Daughter  Contact Information:     Housing/Transportation Living arrangements for the past 2 months:  Single Family Home Source of Information:  Patient, Adult Children Patient Interpreter Needed:  None Criminal Activity/Legal Involvement Pertinent to Current Situation/Hospitalization:  No - Comment as needed Significant Relationships:  Adult Children Lives with:  Self Do you feel safe going back to the place where you live?  Yes Need for family participation in patient care:  Yes (Comment)  Care giving concerns:   Patient admitted for left total hip arthroplasty. Patient disposition changed from [facility] to SNF.   Social Worker assessment / plan: CSW met with patient at beside, explain role and reason for visit-to assist with discharge to SNF for rehab. Patient reports she lives home alone and her adult children work. She reports prior to the surgery she had weakness/pain on left side and used a cane, walker and wheelchair for assistance. Patient reports she has been to SNF in the past for rehab. Patient gave CSW permission to discuss SNF process and bed offers with her daughter.   CSW called daughter Joseph Art to discuss SNF options. She reports the patient usually "bounces back quickly" but this time is requirign more assistance/rehab. She provided CSW with list of facilities close to their home-[facility] and [facility] sent patient clinicals to both facilities, waiting to determine if able to accept. CSW will follow up with patient daughter.   FL2/PASRR  complete.   Plan: SNF  Employment status:  Retired Nurse, adult PT Recommendations:  [place] / Referral to community resources:  [place]  Patient/Family's Response to care:  Agreeable and Responding  Patient/Family's Understanding of and Emotional Response to Diagnosis, Current Treatment, and Prognosis:  Patient and daughter have a good understanding and diagnosis and follow up treatment at [facility].   Emotional Assessment Appearance:  Appears stated age Attitude/Demeanor/Rapport:    Affect (typically observed):  Accepting Orientation:  Oriented to Self, Oriented to  Time, Oriented to Place, Oriented to Situation Alcohol / Substance use:  Not Applicable Psych involvement (Current and /or in the community):  No (Comment)  Discharge Needs  Concerns to be addressed:  Discharge Planning Concerns Readmission within the last 30 days:  No Current discharge risk:  Dependent with Mobility Barriers to Discharge:  Ship broker, Continued Medical Work up   [employer], [place] 07/07/2017, 1:37 PM

## 2017-07-07 NOTE — Evaluation (Signed)
Occupational Therapy Evaluation Patient Details Name: Olivia Powers MRN: [id number] DOB: 1940-03-04 Today's Date: 07/07/2017    History of Present Illness Patient is a 77 y/o female admitted for L direct anterior THA.  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   Clinical Impression   Pt was admitted for the above. She had posterior LOB when standing and needs min to mod A for sit to stand. She had decreased activity tolerance due to pain. Pt has AE at home. Will follow in acute setting working on increased safety and independence with sit to stand for adls and bathroom transfers. She has all DME.    Follow Up Recommendations  Supervision/Assistance - 24 hour(for mobility and ADLs)    Equipment Recommendations  None recommended by OT    Recommendations for Other Services       Precautions / Restrictions Precautions Precautions: Fall Restrictions Weight Bearing Restrictions: No      Mobility Bed Mobility               General bed mobility comments: oob  Transfers   Equipment used: Rolling walker (2 wheeled)   Sit to Stand: Min assist;Mod assist         General transfer comment: assist to rise and stabilize; LOB posteriorly when coming to stand. Cues for UE/LE placement    Balance             Standing balance-Leahy Scale: Poor                             ADL either performed or assessed with clinical judgement   ADL Overall ADL's : Needs assistance/impaired Eating/Feeding: Independent   Grooming: Oral care;Set up;Sitting   Upper Body Bathing: Set up;Sitting   Lower Body Bathing: Minimal assistance;Moderate assistance;With adaptive equipment;Sit to/from stand   Upper Body Dressing : Set up;Sitting   Lower Body Dressing: Moderate assistance;With adaptive equipment                 General ADL Comments: ambulated a couple of steps towards  sink but pt with increased pain and couldn't tolerate.  Unsteady/LOB posteriorly when standing     Vision         Perception     Praxis      Pertinent Vitals/Pain Pain Assessment: 0-10 Pain Score: 8  Pain Location: R hip with ambulating Pain Descriptors / Indicators: Grimacing;Burning Pain Intervention(s): Limited activity within patient's tolerance;Monitored during session;Premedicated before session;Repositioned;Ice applied     Hand Dominance     Extremity/Trunk Assessment Upper Extremity Assessment Upper Extremity Assessment: Generalized weakness           Communication Communication Communication: HOH   Cognition Arousal/Alertness: Awake/alert Behavior During Therapy: WFL for tasks assessed/performed Overall Cognitive Status: Within Functional Limits for tasks assessed                                     General Comments       Exercises     Shoulder Instructions      Home Living Family/patient expects to be discharged to:: Private residence   Available Help at Discharge: Family;Available PRN/intermittently               Bathroom Shower/Tub: Occupational psychologist: Standard  Home Equipment: Shower seat;Grab bars - tub/shower;Grab bars - toilet;Walker - 2 wheels;Cane - single point;Wheelchair - [employer] - 4 wheels;Bedside commode   Additional Comments: son lives a couple of houses down. Feels like someone will be around to assist her.  Has AE        Prior Functioning/Environment Level of Independence: Independent with assistive device(s)                 OT Problem List: Decreased strength;Decreased activity tolerance;Impaired balance (sitting and/or standing);Pain;Decreased knowledge of use of DME or AE      OT Treatment/Interventions: Self-care/ADL training;DME and/or AE instruction;Patient/family education;Balance training;Therapeutic activities    OT Goals(Current goals can be found in the care plan  section) Acute Rehab OT Goals Patient Stated Goal: to return home if able OT Goal Formulation: With patient Time For Goal Achievement: 07/21/17 Potential to Achieve Goals: Good ADL Goals Pt Will Transfer to Toilet: with min guard assist;ambulating;bedside commode;stand pivot transfer(and hygiene) Pt Will Perform Tub/Shower Transfer: Shower transfer;with min assist;ambulating;shower seat  OT Frequency: Min 2X/week   Barriers to D/C:            Co-evaluation              AM-PAC PT "6 Clicks" Daily Activity     Outcome Measure Help from another person eating meals?: None Help from another person taking care of personal grooming?: A Little Help from another person toileting, which includes using toliet, bedpan, or urinal?: A Lot Help from another person bathing (including washing, rinsing, drying)?: A Lot Help from another person to put on and taking off regular upper body clothing?: A Little Help from another person to put on and taking off regular lower body clothing?: A Lot 6 Click Score: 16   End of Session    Activity Tolerance: Patient tolerated treatment well;Patient limited by pain Patient left: in chair;with call bell/phone within reach;with chair alarm set  OT Visit Diagnosis: Pain Pain - Right/Left: Left Pain - part of body: Hip                Time: 0950-1010 OT Time Calculation (min): 20 min Charges:  OT General Charges $OT Visit: 1 Visit OT Evaluation $OT Eval Low Complexity: 1 Low G-Codes:     [place], OTR/L [phone] 07/07/2017  Olivia Powers 07/07/2017, 11:01 AM

## 2017-07-07 NOTE — Progress Notes (Signed)
Plan for d/c to SNF, discharge planning per CSW. [phone] 

## 2017-07-07 NOTE — Progress Notes (Signed)
Physical Therapy Treatment Patient Details Name: Olivia Powers MRN: [id number] DOB: 08/21/1940 Today's Date: 07/07/2017    History of Present Illness Patient is a 77 y/o female admitted for L direct anterior THA.  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    PT Comments    POD # 1 pm session Assisted with amb a decreased distance due to increased pain and fatigue.  General Gait Details: 75% VC's on proper sequencing, proper walker to self distance and forward posture to decrease posterior LOB.  Great difficulty advancing L LE and increased pain.  Assisted to and from bathroom then back to bed.  Positioned to comfort and applied ICE.  Pt progressing slowly and will need ST Rehab at SNF prior to returning home alone.  Follow Up Recommendations  SNF;Supervision/Assistance - 24 hour     Equipment Recommendations  None recommended by PT    Recommendations for Other Services       Precautions / Restrictions Precautions Precautions: Fall    Mobility  Bed Mobility Overal bed mobility: Needs Assistance Bed Mobility: Sit to Supine     Supine to sit: Min assist;Mod assist     General bed mobility comments: assist L LE and increased time  Transfers Overall transfer level: Needs assistance Equipment used: Rolling walker (2 wheeled) Transfers: Sit to/from [facility] Sit to Stand: Min assist;Mod assist Stand pivot transfers: Mod assist;+2 safety/equipment       General transfer comment: 50% VC's on proper hand placment and safety with turns   Ambulation/Gait Ambulation/Gait assistance: Min assist;+2 safety/equipment Ambulation Distance (Feet): 12 Feet Assistive device: Rolling walker (2 wheeled) Gait Pattern/deviations: Step-to pattern;Decreased stance time - left Gait velocity: decreased x 2   General Gait Details: 75% VC's on proper sequencing, proper walker to  self distance and forward posture to decrease posterior LOB.  Great difficulty advancing L LE and increased pain.   Stairs             Wheelchair Mobility    Modified Rankin (Stroke Patients Only)       Balance                                            Cognition Arousal/Alertness: Awake/alert Behavior During Therapy: WFL for tasks assessed/performed Overall Cognitive Status: Within Functional Limits for tasks assessed                                 General Comments: required repeat functional VC's to complete task      Exercises      General Comments        Pertinent Vitals/Pain Pain Assessment: 0-10 Pain Score: 8  Pain Location: R hip with ambulating Pain Descriptors / Indicators: Grimacing;Burning;Operative site guarding Pain Intervention(s): Monitored during session;Repositioned;Ice applied;Premedicated before session    Home Living                      Prior Function            PT Goals (current goals can now be found in the care plan section) Progress towards PT goals: Progressing toward goals    Frequency    7X/week      PT  Plan Current plan remains appropriate    Co-evaluation              AM-PAC PT "6 Clicks" Daily Activity  Outcome Measure  Difficulty turning over in bed (including adjusting bedclothes, sheets and blankets)?: A Lot Difficulty moving from lying on back to sitting on the side of the bed? : A Lot Difficulty sitting down on and standing up from a chair with arms (e.g., wheelchair, bedside commode, etc,.)?: A Lot Help needed moving to and from a bed to chair (including a wheelchair)?: A Lot Help needed walking in hospital room?: A Lot Help needed climbing 3-5 steps with a railing? : A Lot 6 Click Score: 12    End of Session Equipment Utilized During Treatment: Gait belt Activity Tolerance: Patient limited by pain;Patient limited by fatigue Patient left: in bed;with bed  alarm set;with call bell/phone within reach Nurse Communication: Mobility status PT Visit Diagnosis: History of falling (Z91.81);Other abnormalities of gait and mobility (R26.89);Pain Pain - Right/Left: Left Pain - part of body: Hip     Time: 5697-9480 PT Time Calculation (min) (ACUTE ONLY): 28 min  Charges:  $Gait Training: 8-22 mins $Therapeutic Activity: 8-22 mins                    G Codes:       Rica Koyanagi  PTA [facility]  Acute  Rehab Pager      [phone]

## 2017-07-07 NOTE — Progress Notes (Signed)
Physical Therapy Treatment Patient Details Name: Olivia Powers MRN: [id number] DOB: [birth year] Today's Date: 07/07/2017    History of Present Illness Patient is a 77 y/o female admitted for L direct anterior THA.  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    PT Comments    POD # 1 am session Pt required + 2 assist for safety.  General Gait Details: 75% VC's on proper sequencing, proper walker to self distance and forward posture to decrease posterior LOB.  Great difficulty advancing L LE and increased pain. General Gait Details: 75% VC's on proper sequencing, proper walker to self distance and forward posture to decrease posterior LOB.  Great difficulty advancing L LE and increased pain. Returned to room then performed some THR TE's followed by ICE. Pt lives home alone and will need ST Rehab at SNF   Follow Up Recommendations  SNF;Supervision/Assistance - 24 hour     Equipment Recommendations  None recommended by PT    Recommendations for Other Services       Precautions / Restrictions Precautions Precautions: Fall    Mobility  Bed Mobility Overal bed mobility: Needs Assistance Bed Mobility: Supine to Sit     Supine to sit: Min assist;Mod assist     General bed mobility comments: assist L LE and increased time  Transfers Overall transfer level: Needs assistance Equipment used: Rolling walker (2 wheeled) Transfers: Sit to/from [facility] Sit to Stand: Min assist;Mod assist Stand pivot transfers: Mod assist;+2 safety/equipment       General transfer comment: 50% VC's on proper hand placment and safety with turns   Ambulation/Gait Ambulation/Gait assistance: Min assist;+2 safety/equipment Ambulation Distance (Feet): 18 Feet Assistive device: Rolling walker (2 wheeled) Gait Pattern/deviations: Step-to pattern;Decreased stance time - left Gait velocity:  decreased x 2   General Gait Details: 75% VC's on proper sequencing, proper walker to self distance and forward posture to decrease posterior LOB.  Great difficulty advancing L LE and increased pain.   Stairs             Wheelchair Mobility    Modified Rankin (Stroke Patients Only)       Balance                                            Cognition Arousal/Alertness: Awake/alert Behavior During Therapy: WFL for tasks assessed/performed Overall Cognitive Status: Within Functional Limits for tasks assessed                                 General Comments: required repeat functional VC's to complete task      Exercises   Total Hip Replacement TE's 10 reps ankle pumps 10 reps knee presses 10 reps heel slides 10 reps SAQ's 10 reps ABD Followed by ICE     General Comments        Pertinent Vitals/Pain Pain Assessment: 0-10 Pain Score: 8  Pain Location: R hip with ambulating Pain Descriptors / Indicators: Grimacing;Burning;Operative site guarding Pain Intervention(s): Monitored during session;Repositioned;Ice applied;Premedicated before session    Home Living                      Prior Function  PT Goals (current goals can now be found in the care plan section) Progress towards PT goals: Progressing toward goals    Frequency    7X/week      PT Plan Current plan remains appropriate    Co-evaluation              AM-PAC PT "6 Clicks" Daily Activity  Outcome Measure  Difficulty turning over in bed (including adjusting bedclothes, sheets and blankets)?: A Lot Difficulty moving from lying on back to sitting on the side of the bed? : A Lot Difficulty sitting down on and standing up from a chair with arms (e.g., wheelchair, bedside commode, etc,.)?: A Lot Help needed moving to and from a bed to chair (including a wheelchair)?: A Lot Help needed walking in hospital room?: A Lot Help needed  climbing 3-5 steps with a railing? : A Lot 6 Click Score: 12    End of Session Equipment Utilized During Treatment: Gait belt Activity Tolerance: Patient limited by pain;Patient limited by fatigue Patient left: in chair;with call bell/phone within reach;with chair alarm set Nurse Communication: Mobility status PT Visit Diagnosis: History of falling (Z91.81);Other abnormalities of gait and mobility (R26.89);Pain Pain - Right/Left: Left Pain - part of body: Hip     Time: 1010-1040 PT Time Calculation (min) (ACUTE ONLY): 30 min  Charges:  $Gait Training: 8-22 mins $Therapeutic Exercise: 8-22 mins                    G Codes:       Rica Koyanagi  PTA [facility]  Acute  Rehab Pager      [phone]

## 2017-07-07 NOTE — Progress Notes (Signed)
   Subjective: 1 Day Post-Op Procedure(s) (LRB): LEFT TOTAL HIP ARTHROPLASTY ANTERIOR APPROACH (Left) Patient reports pain as mild.   Patient seen in rounds with Dr. Wynelle Link. Patient is fair but she did have some difficulty with therapy yesterday due to fatigue and discomfort.  Denies SOB and chest pain. Voiding and positive flatus.  Plan is to go Home after hospital stay.  Objective: Vital signs in last 24 hours: Temp:  [97.4 F (36.3 C)-98 F (36.7 C)] 98 F (36.7 C) (06/06 0612) Pulse Rate:  [64-105] 65 (06/06 0612) Resp:  [12-18] 16 (06/06 0612) BP: (104-145)/(52-75) 108/52 (06/06 0612) SpO2:  [97 %-100 %] 99 % (06/06 0612) Weight:  [75.8 kg (167 lb)] 75.8 kg (167 lb) (06/05 1151)  Intake/Output from previous day:  Intake/Output Summary (Last 24 hours) at 07/07/2017 0758 Last data filed at 07/07/2017 2595 Gross per 24 hour  Intake 3415 ml  Output 1205 ml  Net 2210 ml     Labs: Recent Labs    07/07/17 0530  HGB 10.8*   Recent Labs    07/07/17 0530  WBC 13.8*  RBC 3.73*  HCT 33.5*  PLT 247   Recent Labs    07/07/17 0530  NA 141  K 4.2  CL 109  CO2 26  BUN 21*  CREATININE 0.96  GLUCOSE 120*  CALCIUM 9.0    EXAM General - Patient is Alert and Oriented Extremity - Neurologically intact Sensation intact distally Intact pulses distally Dorsiflexion/Plantar flexion intact Compartment soft Dressing - dressing C/D/I Motor Function - intact, moving foot and toes well on exam.  Hemovac pulled without difficulty.  Past Medical History:  Diagnosis Date  . Alopecia   . Arthritis   . Cancer ([place])    skin cancer of chest melanoma  . Diabetes mellitus without complication ([place])    taken off Metformin by PCP this year.  . Gout   . 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 Hypercholesteremia   . Hypertension     Assessment/Plan: 1 Day Post-Op Procedure(s) (LRB): LEFT TOTAL HIP ARTHROPLASTY ANTERIOR APPROACH  (Left) Principal Problem:   OA (osteoarthritis) of hip  Estimated body mass index is 34.9 kg/m as calculated from the following:   Height as of this encounter: 4\' 10"  (1.473 m).   Weight as of this encounter: 75.8 kg (167 lb). Advance diet Up with therapy D/C IV fluids when tolerating POs well  DVT Prophylaxis - Aspirin Weight Bearing As Tolerated  Hemovac Pulled   Continue with PT today. Hopeful for Dc home tomorrow pending progress and her meeting goals.  Ardeen Jourdain, PA-C Orthopaedic Surgery 07/07/2017, 7:58 AM

## 2017-07-08 LAB — BASIC METABOLIC PANEL
ANION GAP: 7 (ref 5–15)
BUN: 22 mg/dL — AB (ref 6–20)
CALCIUM: 9 mg/dL (ref 8.9–10.3)
CO2: 27 mmol/L (ref 22–32)
Chloride: 108 mmol/L (ref 101–111)
Creatinine, Ser: 0.91 mg/dL (ref 0.44–1.00)
GFR calc Af Amer: >60 mL/min (ref >60)
GFR, EST NON AFRICAN AMERICAN: 58 mL/min — AB (ref >60)
GLUCOSE: 116 mg/dL — AB (ref 65–99)
Potassium: 4.4 mmol/L (ref 3.5–5.1)
SODIUM: 142 mmol/L (ref 135–145)

## 2017-07-08 LAB — CBC
HCT: 32.6 % — ABNORMAL LOW (ref 36.0–46.0)
Hemoglobin: 10.8 g/dL — ABNORMAL LOW (ref 12.0–15.0)
MCH: 29.7 pg (ref 26.0–34.0)
MCHC: 33.1 g/dL (ref 30.0–36.0)
MCV: 89.6 fL (ref 78.0–100.0)
PLATELETS: 256 10*3/uL (ref 150–400)
RBC: 3.64 MIL/uL — ABNORMAL LOW (ref 3.87–5.11)
RDW: 13.8 % (ref 11.5–15.5)
WBC: 13.1 10*3/uL — ABNORMAL HIGH (ref 4.0–10.5)

## 2017-07-08 NOTE — Progress Notes (Signed)
Discharge planning, spoke with patient and daughter at beside. Chose [facility] for [facility] services, PT to eval and treat. Contacted [facility] for referral. Requesting a RW, the one she has is old and not functioning. Contacted [facility] to deliver to room. [phone]

## 2017-07-08 NOTE — Progress Notes (Signed)
Occupational Therapy Treatment Patient Details Name: Olivia Powers MRN: [id number] DOB: 07-13-1940 Today's Date: 07/08/2017    History of present illness Patient is a 77 y/o female admitted for L direct anterior THA.  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   OT comments  Pt much improved today. No LOB.  Performed toilet and shower transfers.  Follow Up Recommendations  Supervision/Assistance - for mobility initially   Equipment Recommendations  None recommended by OT    Recommendations for Other Services      Precautions / Restrictions Precautions Precautions: Fall Restrictions Weight Bearing Restrictions: No       Mobility Bed Mobility            oob      Transfers   Equipment used: Rolling walker (2 wheeled)   Sit to Stand: Min guard         General transfer comment: for safety    Balance                                           ADL either performed or assessed with clinical judgement   ADL       Grooming: Wash/dry hands;Supervision/safety;Standing                   Toilet Transfer: Min guard;Ambulation;Comfort height toilet;Grab bars;RW   Toileting- Water quality scientist and Hygiene: Min guard;Sit to/from stand   Tub/ Shower Transfer: Walk-in shower;Min guard;Ambulation;Shower seat     General ADL Comments: had to simulate shower ledge as her room has an accessible shower.  She will have to lift leg higher to access standard shower ledge     Vision       Perception     Praxis      Cognition Arousal/Alertness: Awake/alert Behavior During Therapy: WFL for tasks assessed/performed Overall Cognitive Status: Within Functional Limits for tasks assessed                                          Exercises     Shoulder Instructions       General Comments      Pertinent Vitals/ Pain       Pain  Score: 4  Pain Location: R hip with ambulating Pain Descriptors / Indicators: Grimacing;Burning;Operative site guarding Pain Intervention(s): Limited activity within patient's tolerance;Monitored during session;Premedicated before session;Repositioned;Ice applied  Home Living                                          Prior Functioning/Environment              Frequency  Min 2X/week        Progress Toward Goals  OT Goals(current goals can now be found in the care plan section)  Progress towards OT goals: Goals met/education completed, patient discharged from [facility] PT "6 Clicks" Daily Activity     Outcome Measure   Help from another  person eating meals?: None Help from another person taking care of personal grooming?: A Little Help from another person toileting, which includes using toliet, bedpan, or urinal?: A Little Help from another person bathing (including washing, rinsing, drying)?: A Lot Help from another person to put on and taking off regular upper body clothing?: A Little Help from another person to put on and taking off regular lower body clothing?: A Lot 6 Click Score: 17    End of Session    OT Visit Diagnosis: Pain Pain - Right/Left: Left Pain - part of body: Hip   Activity Tolerance Patient tolerated treatment well;Patient limited by pain   Patient Left in chair;with call bell/phone within reach;with chair alarm set   Nurse Communication          Time: 7207-2182 OT Time Calculation (min): 25 min  Charges: OT General Charges $OT Visit: 1 Visit OT Treatments $Self Care/Home Management : 23-37 mins  Lesle Chris, OTR/L [phone] 07/08/2017   [place] 07/08/2017, 10:15 AM

## 2017-07-08 NOTE — Progress Notes (Addendum)
   Subjective: 2 Days Post-Op Procedure(s) (LRB): LEFT TOTAL HIP ARTHROPLASTY ANTERIOR APPROACH (Left) Patient reports pain as mild.   Plan is to go Home after hospital stay. THE PATIENT DOES NOT WANT TO GO TO A SNF. PLAN IS HOME WITH HOME HEALTH AS LONG AS SHE PROGRESSES SUFFICIENTLY TODAY  Objective: Vital signs in last 24 hours: Temp:  [97.8 F (36.6 C)-98.6 F (37 C)] 98.3 F (36.8 C) (06/07 0539) Pulse Rate:  [49-90] 53 (06/07 0600) Resp:  [15-17] 15 (06/07 0539) BP: (106-131)/(44-68) 121/52 (06/07 0539) SpO2:  [96 %-100 %] 100 % (06/07 0539)  Intake/Output from previous day:  Intake/Output Summary (Last 24 hours) at 07/08/2017 0720 Last data filed at 07/08/2017 0600 Gross per 24 hour  Intake 2397.25 ml  Output 1700 ml  Net 697.25 ml    Intake/Output this shift: No intake/output data recorded.  Labs: Recent Labs    07/07/17 0530 07/08/17 0533  HGB 10.8* 10.8*   Recent Labs    07/07/17 0530 07/08/17 0533  WBC 13.8* 13.1*  RBC 3.73* 3.64*  HCT 33.5* 32.6*  PLT 247 256   Recent Labs    07/07/17 0530 07/08/17 0533  NA 141 142  K 4.2 4.4  CL 109 108  CO2 26 27  BUN 21* 22*  CREATININE 0.96 0.91  GLUCOSE 120* 116*  CALCIUM 9.0 9.0   No results for input(s): LABPT, INR in the last 72 hours.  EXAM General - Patient is Alert, Appropriate and Oriented Extremity - Neurologically intact Neurovascular intact Incision: dressing C/D/I No cellulitis present Compartment soft Dressing/Incision - clean, dry, no drainage Motor Function - intact, moving foot and toes well on exam.   Past Medical History:  Diagnosis Date  . Alopecia   . Arthritis   . Cancer ([place])    skin cancer of chest melanoma  . Diabetes mellitus without complication ([place])    taken off Metformin by PCP this year.  . Gout   . 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 Hypercholesteremia   . Hypertension     Assessment/Plan: 2 Days Post-Op Procedure(s)  (LRB): LEFT TOTAL HIP ARTHROPLASTY ANTERIOR APPROACH (Left) Principal Problem:   OA (osteoarthritis) of hip   Up with therapy D/C IV fluids Discharge home with home health  I have discussed post-hospital disposition with the patient and she does not want to go to a SNF.She will have help at home if she returns home.  She feels much better this AM and I anticipate that she will improve with therapy today If she does not improve sufficiently with 2 sessions today then we will proceed with SNF placement. If she improves as expected then she will go home with home health this afternoon or tomorrow morning.Marland Kitchen  DVT Prophylaxis - Aspirin Weight Bearing As Tolerated left Leg  Gaynelle Arabian 07/08/2017, 7:20 AM

## 2017-07-08 NOTE — Progress Notes (Signed)
Physical Therapy Treatment Patient Details Name: Olivia Powers MRN: [id number] DOB: [birth year] Today's Date: 07/08/2017    History of Present Illness Patient is a 77 y/o female admitted for L direct anterior THA.  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    PT Comments    POD # 2 am session Assisted with amb a greater distance in hallway, assisted to bathroom and then performed some THR TE's following handout HEP.  Pt plans to D/C to home with help from family/friends and declines SNF.   Follow Up Recommendations  SNF;Supervision/Assistance - 24 hour(pt delines and plans to go home)     Equipment Recommendations       Recommendations for Other Services       Precautions / Restrictions Precautions Precautions: Fall Restrictions Weight Bearing Restrictions: No    Mobility  Bed Mobility               General bed mobility comments: OOB in recliner  Transfers Overall transfer level: Needs assistance Equipment used: Rolling walker (2 wheeled) Transfers: Sit to/from [employer] Transfers Sit to Stand: Min guard;Supervision         General transfer comment: VC's for safety  Ambulation/Gait Ambulation/Gait assistance: Supervision Ambulation Distance (Feet): 45 Feet Assistive device: Rolling walker (2 wheeled) Gait Pattern/deviations: Step-to pattern;Decreased stance time - left     General Gait Details: much improved gait and stability   Stairs             Wheelchair Mobility    Modified Rankin (Stroke Patients Only)       Balance                                            Cognition Arousal/Alertness: Awake/alert Behavior During Therapy: WFL for tasks assessed/performed Overall Cognitive Status: Within Functional Limits for tasks assessed                                        Exercises   Total Hip  Replacement TE's 10 reps ankle pumps 10 reps knee presses 10 reps heel slides 10 reps SAQ's 10 reps ABD Followed by ICE     General Comments        Pertinent Vitals/Pain Pain Assessment: Faces Faces Pain Scale: Hurts a little bit Pain Location: R hip with ambulating Pain Descriptors / Indicators: Grimacing;Burning;Operative site guarding Pain Intervention(s): Monitored during session;Repositioned;Ice applied;Premedicated before session    Home Living                      Prior Function            PT Goals (current goals can now be found in the care plan section) Progress towards PT goals: Progressing toward goals    Frequency    7X/week      PT Plan Current plan remains appropriate    Co-evaluation              AM-PAC PT "6 Clicks" Daily Activity  Outcome Measure  Difficulty turning over in bed (including adjusting bedclothes, sheets and blankets)?: A Little Difficulty moving from lying on back to sitting on the side of the bed? : A  Little Difficulty sitting down on and standing up from a chair with arms (e.g., wheelchair, bedside commode, etc,.)?: A Little Help needed moving to and from a bed to chair (including a wheelchair)?: A Little Help needed walking in hospital room?: A Little Help needed climbing 3-5 steps with a railing? : A Lot 6 Click Score: 17    End of Session Equipment Utilized During Treatment: Gait belt Activity Tolerance: Patient limited by pain;Patient limited by fatigue Patient left: in bed;with bed alarm set;with call bell/phone within reach Nurse Communication: Mobility status PT Visit Diagnosis: History of falling (Z91.81);Other abnormalities of gait and mobility (R26.89);Pain Pain - Right/Left: Left Pain - part of body: Hip     Time: 9357-0177 PT Time Calculation (min) (ACUTE ONLY): 26 min  Charges:  $Gait Training: 8-22 mins $Therapeutic Exercise: 8-22 mins                    G Codes:       Rica Koyanagi   PTA [facility]  Acute  Rehab Pager      [phone]

## 2017-07-10 NOTE — Discharge Summary (Signed)
Physician Discharge Summary   Patient ID: Olivia Powers MRN: [id number] DOB/AGE: 1941-01-12 77 y.o.  Admit date: 07/06/2017 Discharge date: 07/08/2017  Primary Diagnosis: Primary osteoarthritis left hip Admission Diagnoses:  Past Medical History:  Diagnosis Date  . Alopecia   . Arthritis   . Cancer ([place])    skin cancer of chest melanoma  . Diabetes mellitus without complication ([place])    taken off Metformin by PCP this year.  . Gout   . 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 Hypercholesteremia   . Hypertension    Discharge Diagnoses:   Principal Problem:   OA (osteoarthritis) of hip  Estimated body mass index is 34.9 kg/m as calculated from the following:   Height as of this encounter: 4' 10" (1.473 m).   Weight as of this encounter: 75.8 kg (167 lb).  Procedure(s) (LRB): LEFT TOTAL HIP ARTHROPLASTY ANTERIOR APPROACH (Left)   Consults: None  HPI: Olivia Powers, 77 y.o. female, has a history of pain and functional disability in the left hip(s) due to arthritis and patient has failed non-surgical conservative treatments for greater than 12 weeks to include NSAID's and/or analgesics, corticosteriod injections, flexibility and strengthening excercises and activity modification.  Onset of symptoms was gradual starting >10 years ago with gradually worsening course since that time.The patient noted no past surgery on the left hip(s).  Patient currently rates pain in the left hip at 7 out of 10 with activity. Patient has night pain, worsening of pain with activity and weight bearing, pain that interfers with activities of daily living and pain with passive range of motion. Patient has evidence of subchondral cysts, periarticular osteophytes and joint space narrowing by imaging studies. This condition presents safety issues increasing the risk of falls.  There is no current active infection.    Laboratory Data: Admission on 07/06/2017, Discharged on  07/08/2017  Component Date Value Ref Range Status  . Glucose-Capillary 07/06/2017 110* 65 - 99 mg/dL Final  . Glucose-Capillary 07/06/2017 128* 65 - 99 mg/dL Final  . WBC 07/07/2017 13.8* 4.0 - 10.5 K/uL Final  . RBC 07/07/2017 3.73* 3.87 - 5.11 MIL/uL Final  . Hemoglobin 07/07/2017 10.8* 12.0 - 15.0 g/dL Final  . HCT 07/07/2017 33.5* 36.0 - 46.0 % Final  . MCV 07/07/2017 89.8  78.0 - 100.0 fL Final  . MCH 07/07/2017 29.0  26.0 - 34.0 pg Final  . MCHC 07/07/2017 32.2  30.0 - 36.0 g/dL Final  . RDW 07/07/2017 13.6  11.5 - 15.5 % Final  . Platelets 07/07/2017 247  150 - 400 K/uL Final   Performed at [facility], [place] [address] [place], [place] [address]  . Sodium 07/07/2017 141  135 - 145 mmol/L Final  . Potassium 07/07/2017 4.2  3.5 - 5.1 mmol/L Final  . Chloride 07/07/2017 109  101 - 111 mmol/L Final  . CO2 07/07/2017 26  22 - 32 mmol/L Final  . Glucose, Bld 07/07/2017 120* 65 - 99 mg/dL Final  . BUN 07/07/2017 21* 6 - 20 mg/dL Final  . Creatinine, Ser 07/07/2017 0.96  0.44 - 1.00 mg/dL Final  . Calcium 07/07/2017 9.0  8.9 - 10.3 mg/dL Final  . GFR calc non Af Amer 07/07/2017 55* >60 mL/min Final  . GFR calc Af Amer 07/07/2017 >60  >60 mL/min Final   Comment: (NOTE) The eGFR has been calculated using the CKD EPI equation. This calculation has not been validated in all clinical situations.  eGFR's persistently <60 mL/min signify possible Chronic Kidney Disease.   Georgiann Hahn gap 07/07/2017 6  5 - 15 Final   Performed at [facility], [place] [address], [place], [place] [address]  . WBC 07/08/2017 13.1* 4.0 - 10.5 K/uL Final  . RBC 07/08/2017 3.64* 3.87 - 5.11 MIL/uL Final  . Hemoglobin 07/08/2017 10.8* 12.0 - 15.0 g/dL Final  . HCT 07/08/2017 32.6* 36.0 - 46.0 % Final  . MCV 07/08/2017 89.6  78.0 - 100.0 fL Final  . MCH 07/08/2017 29.7  26.0 - 34.0 pg Final  . MCHC 07/08/2017 33.1  30.0 - 36.0 g/dL Final  . RDW 07/08/2017 13.8  11.5 - 15.5 % Final   . Platelets 07/08/2017 256  150 - 400 K/uL Final   Performed at [facility], [place] [address], [place], [place] [address]  . Sodium 07/08/2017 142  135 - 145 mmol/L Final  . Potassium 07/08/2017 4.4  3.5 - 5.1 mmol/L Final  . Chloride 07/08/2017 108  101 - 111 mmol/L Final  . CO2 07/08/2017 27  22 - 32 mmol/L Final  . Glucose, Bld 07/08/2017 116* 65 - 99 mg/dL Final  . BUN 07/08/2017 22* 6 - 20 mg/dL Final  . Creatinine, Ser 07/08/2017 0.91  0.44 - 1.00 mg/dL Final  . Calcium 07/08/2017 9.0  8.9 - 10.3 mg/dL Final  . GFR calc non Af Amer 07/08/2017 58* >60 mL/min Final  . GFR calc Af Amer 07/08/2017 >60  >60 mL/min Final   Comment: (NOTE) The eGFR has been calculated using the CKD EPI equation. This calculation has not been validated in all clinical situations. eGFR's persistently <60 mL/min signify possible Chronic Kidney Disease.   Georgiann Hahn gap 07/08/2017 7  5 - 15 Final   Performed at [facility], [place] [address] [place], [place] [address]  Hospital Outpatient Visit on 07/01/2017  Component Date Value Ref Range Status  . Glucose-Capillary 07/01/2017 104* 65 - 99 mg/dL Final  . MRSA, PCR 07/01/2017 NEGATIVE  NEGATIVE Final  . Staphylococcus aureus 07/01/2017 NEGATIVE  NEGATIVE Final   Comment: (NOTE) The Xpert SA Assay (FDA approved for NASAL specimens in patients 71 years of age and older), is one component of a comprehensive surveillance program. It is not intended to diagnose infection nor to guide or monitor treatment. Performed at [facility], [place] [address], [place], [place] [address]   . aPTT 07/01/2017 27  24 - 36 seconds Final   Performed at [facility], [place] [address], [place], [place] [address]  . WBC 07/01/2017 7.2  4.0 - 10.5 K/uL Final  . RBC 07/01/2017 4.92  3.87 - 5.11 MIL/uL Final  . Hemoglobin 07/01/2017 15.0  12.0 - 15.0 g/dL Final  . HCT 07/01/2017 44.3  36.0 - 46.0 % Final  .  MCV 07/01/2017 90.0  78.0 - 100.0 fL Final  . MCH 07/01/2017 30.5  26.0 - 34.0 pg Final  . MCHC 07/01/2017 33.9  30.0 - 36.0 g/dL Final  . RDW 07/01/2017 13.6  11.5 - 15.5 % Final  . Platelets 07/01/2017 278  150 - 400 K/uL Final   Performed at [facility], [place] [address], [place], [place] [address]  . Sodium 07/01/2017 140  135 - 145 mmol/L Final  . Potassium 07/01/2017 3.9  3.5 - 5.1 mmol/L Final  . Chloride 07/01/2017 104  101 - 111 mmol/L Final  . CO2 07/01/2017 26  22 - 32 mmol/L  Final  . Glucose, Bld 07/01/2017 94  65 - 99 mg/dL Final  . BUN 07/01/2017 20  6 - 20 mg/dL Final  . Creatinine, Ser 07/01/2017 1.00  0.44 - 1.00 mg/dL Final  . Calcium 07/01/2017 9.9  8.9 - 10.3 mg/dL Final  . Total Protein 07/01/2017 7.5  6.5 - 8.1 g/dL Final  . Albumin 07/01/2017 4.6  3.5 - 5.0 g/dL Final  . AST 07/01/2017 19  15 - 41 U/L Final  . ALT 07/01/2017 17  14 - 54 U/L Final  . Alkaline Phosphatase 07/01/2017 79  38 - 126 U/L Final  . Total Bilirubin 07/01/2017 0.7  0.3 - 1.2 mg/dL Final  . GFR calc non Af Amer 07/01/2017 52* >60 mL/min Final  . GFR calc Af Amer 07/01/2017 >60  >60 mL/min Final   Comment: (NOTE) The eGFR has been calculated using the CKD EPI equation. This calculation has not been validated in all clinical situations. eGFR's persistently <60 mL/min signify possible Chronic Kidney Disease.   Georgiann Hahn gap 07/01/2017 10  5 - 15 Final   Performed at [facility], [place] [address] [place], [place] [address]  . Prothrombin Time 07/01/2017 13.2  11.4 - 15.2 seconds Final  . INR 07/01/2017 1.01   Final   Performed at [facility], [place] [address], [place], [place] [address]  . ABO/RH(D) 07/01/2017 A POS   Final  . Antibody Screen 07/01/2017 NEG   Final  . Sample Expiration 07/01/2017 07/09/2017   Final  . Extend sample reason 07/01/2017    Final                   Value:NO TRANSFUSIONS OR PREGNANCY IN THE PAST 3  MONTHS Performed at [facility], [place] [address], [place], [place] [address]   . Hgb A1c MFr Bld 07/01/2017 5.5  4.8 - 5.6 % Final   Comment: (NOTE) Pre diabetes:          5.7%-6.4% Diabetes:              >6.4% Glycemic control for   <7.0% adults with diabetes   . Mean Plasma Glucose 07/01/2017 111.15  mg/dL Final   Performed at [place] [address] [place], [place] [address]  . ABO/RH(D) 07/01/2017    Final                   Value:A POS Performed at [facility], [place] [address], [place], [place] [address]      X-Rays:Dg Pelvis Portable  Result Date: 07/06/2017 CLINICAL DATA:  Status post left total hip joint prosthesis placement. EXAM: PORTABLE PELVIS 1-2 VIEWS COMPARISON:  Fluoro spot images performed intraoperatively today. FINDINGS: The patient has undergone left hip joint prosthesis placement. Radiographic positioning of the prosthetic components is good. The interface with the native bone is normal. A surgical drain line is present. IMPRESSION: There is no postprocedure complication following left anterior approach total hip joint prosthesis placement. Electronically Signed   By: David  Martinique M.D.   On: 07/06/2017 10:41   Dg C-arm 1-60 Min-no Report  Result Date: 07/06/2017 Fluoroscopy was utilized by the requesting physician.  No radiographic interpretation.    EKG: Orders placed or performed during the hospital encounter of 07/01/17  . EKG 12 lead  . EKG 12 lead     Hospital Course: Patient was admitted to [facility] and taken to the OR and underwent the above  state procedure without complications.  Patient tolerated the procedure well and was later transferred to the recovery room and then to the orthopaedic floor for postoperative care.  They were given PO and IV analgesics for pain control following their surgery.  They were given 24 hours of postoperative antibiotics of  Anti-infectives (From admission,  onward)   Start     Dose/Rate Route Frequency Ordered Stop   07/06/17 1400  ceFAZolin (ANCEF) IVPB 1 g/50 mL premix     1 g 100 mL/hr over 30 Minutes Intravenous Every 6 hours 07/06/17 1155 07/06/17 2121   07/06/17 0645  ceFAZolin (ANCEF) IVPB 2g/100 mL premix     2 g 200 mL/hr over 30 Minutes Intravenous On call to O.R. 07/06/17 9480 07/06/17 0829   07/06/17 0641  ceFAZolin (ANCEF) 2-4 GM/100ML-% IVPB    Note to Pharmacy:  Mardelle Matte   : cabinet override      07/06/17 0641 07/06/17 0814     and started on DVT prophylaxis in the form of Aspirin.   PT and OT were ordered for total hip protocol.  The patient was allowed to be WBAT with therapy. Discharge planning was consulted to help with postop disposition and equipment needs.  Patient had a fair night on the evening of surgery.  They started to get up OOB with therapy on day one.  Hemovac drain was pulled without difficulty.  Continued to work with therapy into day two.  Dressing was changed on day two and the incision was clean and dry.  The patient had progressed with therapy and meeting their goals.  Incision was healing well.  Patient was seen in rounds and was ready to go home.  Diet: Cardiac diet Activity:WBAT Follow-up:in 2 weeks Disposition - Home Discharged Condition: stable   Discharge Instructions    Call MD / Call 911   Complete by:  As directed    If you experience chest pain or shortness of breath, CALL 911 and be transported to the hospital emergency room.  If you develope a fever above 101 F, pus (white drainage) or increased drainage or redness at the wound, or calf pain, call your surgeon's office.   Constipation Prevention   Complete by:  As directed    Drink plenty of fluids.  Prune juice may be helpful.  You may use a stool softener, such as Colace (over the counter) 100 mg twice a day.  Use MiraLax (over the counter) for constipation as needed.   Diet - low sodium heart healthy   Complete by:  As directed     Discharge instructions   Complete by:  As directed    Dr. Gaynelle Arabian Total Joint Specialist [facility] [address], [place], [place] [address] [phone]  ANTERIOR APPROACH TOTAL HIP REPLACEMENT POSTOPERATIVE DIRECTIONS   Hip Rehabilitation, Guidelines Following Surgery  The results of a hip operation are greatly improved after range of motion and muscle strengthening exercises. Follow all safety measures which are given to protect your hip. If any of these exercises cause increased pain or swelling in your joint, decrease the amount until you are comfortable again. Then slowly increase the exercises. Call your caregiver if you have problems or questions.   HOME CARE INSTRUCTIONS  Remove items at home which could result in a fall. This includes throw rugs or furniture in walking pathways.  ICE to the affected hip every three hours for 30 minutes at a time and then as needed for pain  and swelling.  Continue to use ice on the hip for pain and swelling from surgery. You may notice swelling that will progress down to the foot and ankle.  This is normal after surgery.  Elevate the leg when you are not up walking on it.   Continue to use the breathing machine which will help keep your temperature down.  It is common for your temperature to cycle up and down following surgery, especially at night when you are not up moving around and exerting yourself.  The breathing machine keeps your lungs expanded and your temperature down.   DIET You may resume your previous home diet once your are discharged from the hospital.  DRESSING / WOUND CARE / SHOWERING You may change your dressing every day with sterile gauze.  Please use good hand washing techniques before changing the dressing.  Do not use any lotions or creams on the incision until instructed by your surgeon. You may start showering once you are discharged home but do not submerge the incision under water. Just pat the  incision dry and apply a dry gauze dressing on daily. Change the surgical dressing daily and reapply a dry dressing each time.  ACTIVITY Walk with your walker as instructed. Use walker as long as suggested by your caregivers. Avoid periods of inactivity such as sitting longer than an hour when not asleep. This helps prevent blood clots.  You may resume a sexual relationship in one month or when given the OK by your doctor.  You may return to work once you are cleared by your doctor.  Do not drive a car for 6 weeks or until released by you surgeon.  Do not drive while taking narcotics.  WEIGHT BEARING Weight bearing as tolerated with assist device (walker, cane, etc) as directed, use it as long as suggested by your surgeon or therapist, typically at least 4-6 weeks.  POSTOPERATIVE CONSTIPATION PROTOCOL Constipation - defined medically as fewer than three stools per week and severe constipation as less than one stool per week.  One of the most common issues patients have following surgery is constipation.  Even if you have a regular bowel pattern at home, your normal regimen is likely to be disrupted due to multiple reasons following surgery.  Combination of anesthesia, postoperative narcotics, change in appetite and fluid intake all can affect your bowels.  In order to avoid complications following surgery, here are some recommendations in order to help you during your recovery period.  Colace (docusate) - Pick up an over-the-counter form of Colace or another stool softener and take twice a day as long as you are requiring postoperative pain medications.  Take with a full glass of water daily.  If you experience loose stools or diarrhea, hold the colace until you stool forms back up.  If your symptoms do not get better within 1 week or if they get worse, check with your doctor.  Dulcolax (bisacodyl) - Pick up over-the-counter and take as directed by the product packaging as needed to assist with  the movement of your bowels.  Take with a full glass of water.  Use this product as needed if not relieved by Colace only.   MiraLax (polyethylene glycol) - Pick up over-the-counter to have on hand.  MiraLax is a solution that will increase the amount of water in your bowels to assist with bowel movements.  Take as directed and can mix with a glass of water, juice, soda, coffee, or tea.  Take if  you go more than two days without a movement. Do not use MiraLax more than once per day. Call your doctor if you are still constipated or irregular after using this medication for 7 days in a row.  If you continue to have problems with postoperative constipation, please contact the office for further assistance and recommendations.  If you experience "the worst abdominal pain ever" or develop nausea or vomiting, please contact the office immediatly for further recommendations for treatment.  ITCHING  If you experience itching with your medications, try taking only a single pain pill, or even half a pain pill at a time.  You can also use Benadryl over the counter for itching or also to help with sleep.   TED HOSE STOCKINGS Wear the elastic stockings on both legs for three weeks following surgery during the day but you may remove then at night for sleeping.  MEDICATIONS See your medication summary on the "After Visit Summary" that the nursing staff will review with you prior to discharge.  You may have some home medications which will be placed on hold until you complete the course of blood thinner medication.  It is important for you to complete the blood thinner medication as prescribed by your surgeon.  Continue your approved medications as instructed at time of discharge.  PRECAUTIONS If you experience chest pain or shortness of breath - call 911 immediately for transfer to the hospital emergency department.  If you develop a fever greater that 101 F, purulent drainage from wound, increased redness or  drainage from wound, foul odor from the wound/dressing, or calf pain - CONTACT YOUR SURGEON.                                                   FOLLOW-UP APPOINTMENTS Make sure you keep all of your appointments after your operation with your surgeon and caregivers. You should call the office at the above phone number and make an appointment for approximately two weeks after the date of your surgery or on the date instructed by your surgeon outlined in the "After Visit Summary".  RANGE OF MOTION AND STRENGTHENING EXERCISES  These exercises are designed to help you keep full movement of your hip joint. Follow your caregiver's or physical therapist's instructions. Perform all exercises about fifteen times, three times per day or as directed. Exercise both hips, even if you have had only one joint replacement. These exercises can be done on a training (exercise) mat, on the floor, on a table or on a bed. Use whatever works the best and is most comfortable for you. Use music or television while you are exercising so that the exercises are a pleasant break in your day. This will make your life better with the exercises acting as a break in routine you can look forward to.  Lying on your back, slowly slide your foot toward your buttocks, raising your knee up off the floor. Then slowly slide your foot back down until your leg is straight again.  Lying on your back spread your legs as far apart as you can without causing discomfort.  Lying on your side, raise your upper leg and foot straight up from the floor as far as is comfortable. Slowly lower the leg and repeat.  Lying on your back, tighten up the muscle in the front of your  thigh (quadriceps muscles). You can do this by keeping your leg straight and trying to raise your heel off the floor. This helps strengthen the largest muscle supporting your knee.  Lying on your back, tighten up the muscles of your buttocks both with the legs straight and with the knee  bent at a comfortable angle while keeping your heel on the floor.   IF YOU ARE TRANSFERRED TO A SKILLED REHAB FACILITY If the patient is transferred to a skilled rehab facility following release from the hospital, a list of the current medications will be sent to the facility for the patient to continue.  When discharged from the skilled rehab facility, please have the facility set up the patient's [place] prior to being released. Also, the skilled facility will be responsible for providing the patient with their medications at time of release from the facility to include their pain medication, the muscle relaxants, and their blood thinner medication. If the patient is still at the rehab facility at time of the two week follow up appointment, the skilled rehab facility will also need to assist the patient in arranging follow up appointment in our office and any transportation needs.  MAKE SURE YOU:  Understand these instructions.  Get help right away if you are not doing well or get worse.    Pick up stool softner and laxative for home use following surgery while on pain medications. Do not submerge incision under water. Please use good hand washing techniques while changing dressing each day. May shower starting three days after surgery. Please use a clean towel to pat the incision dry following showers. Continue to use ice for pain and swelling after surgery. Do not use any lotions or creams on the incision until instructed by your surgeon.   Increase activity slowly as tolerated   Complete by:  As directed      Allergies as of 07/08/2017      Reactions   Codeine Nausea And Vomiting   GI upset    Tramadol    hallucinations       Medication List    STOP taking these medications   diclofenac 75 MG EC tablet Commonly known as:  VOLTAREN     TAKE these medications   acetaminophen 500 MG tablet Commonly known as:  TYLENOL Take 1,000 mg by mouth 3 (three) times  daily as needed for moderate pain or headache.   allopurinol 100 MG tablet Commonly known as:  ZYLOPRIM Take 100 mg by mouth daily.   aspirin 325 MG EC tablet Take 1 tablet (325 mg total) by mouth 2 (two) times daily. To prevent blood clots   diphenhydramine-acetaminophen 25-500 MG Tabs tablet Commonly known as:  TYLENOL PM Take 2 tablets by mouth at bedtime as needed (sleep).   losartan 25 MG tablet Commonly known as:  COZAAR Take 25 mg by mouth daily.   Melatonin 3 MG Tabs Take 3 mg by mouth at bedtime as needed (sleep).   methocarbamol 500 MG tablet Commonly known as:  ROBAXIN Take 1 tablet (500 mg total) by mouth every 6 (six) hours as needed for muscle spasms.   nystatin powder Generic drug:  nystatin Apply 1 application topically daily as needed for rash.   omeprazole 20 MG capsule Commonly known as:  PRILOSEC Take 1 capsule (20 mg total) by mouth 2 (two) times daily before a meal.   oxyCODONE 5 MG immediate release tablet Commonly known as:  Oxy IR/ROXICODONE Take 1-2 tablets (  5-10 mg total) by mouth every 6 (six) hours as needed for moderate pain (pain score 4-6).   pantoprazole 40 MG tablet Commonly known as:  PROTONIX Take 40 mg by mouth daily.   polyethylene glycol packet Commonly known as:  MIRALAX / GLYCOLAX Take 17 g by mouth daily.   simvastatin 20 MG tablet Commonly known as:  ZOCOR Take 20 mg by mouth every evening.   trolamine salicylate 10 % cream Commonly known as:  ASPERCREME Apply 1 application topically as needed (leg pain).      Follow-up Information    Gaynelle Arabian, MD. Schedule an appointment as soon as possible for a visit on 07/19/2017.   Specialty:  Orthopedic Surgery Contact information: [address] [place] [address] [place] [address] [phone]        [facility] - Dme Follow up.   Why:  walker Contact information: [address] [place]  [address] [phone]        [facility]-Home Follow up.   Specialty:  [place] Why:  physical therapy Contact information: [place] [address] [phone]           Signed: Ardeen Jourdain, PA-C Orthopaedic Surgery 07/10/2017, 7:11 PM

## 2018-08-18 NOTE — Patient Instructions (Signed)
YOU NEED TO HAVE A COVID 19 TEST ON_______ @_______ , THIS TEST MUST BE DONE BEFORE SURGERY, COME TO [place] ENTRANCE. ONCE YOUR COVID TEST IS COMPLETED, PLEASE BEGIN THE QUARANTINE INSTRUCTIONS AS OUTLINED IN YOUR HANDOUT.                Olivia Powers  08/18/2018   Your procedure is scheduled on: 08-23-18   Report to [facility] Main  Entrance             Report to admitting at         1045 AM      Call this number if you have problems the morning of surgery [phone]    Remember: NO SOLID FOOD AFTER MIDNIGHT THE NIGHT PRIOR TO SURGERY. NOTHING BY MOUTH EXCEPT CLEAR LIQUIDS UNTIL 3 HOURS PRIOR TO [place] SURGERY. PLEASE FINISH   G2    DRINK PER SURGEON ORDER 3 HOURS PRIOR TO SCHEDULED SURGERY TIME WHICH NEEDS TO BE COMPLETED AT ___1015 am then nothing by mouth_________.    CLEAR LIQUID DIET   Foods Allowed                                                                     Foods Excluded  Coffee and tea, regular and decaf                             liquids that you cannot  Plain Jell-O any favor except red or purple                                           see through such as: Fruit ices (not with fruit pulp)                                     milk, soups, orange juice  Iced Popsicles                                    All solid food Carbonated beverages, regular and diet                                    Cranberry, grape and apple juices Sports drinks like Gatorade Lightly seasoned clear broth or consume(fat free) Sugar, honey syrup  Sample Menu Breakfast                                Lunch                                     Supper Cranberry juice                    Beef broth  Chicken broth Jell-O                                     Grape juice                           Apple juice Coffee or tea                        Jell-O                                      Popsicle                 Coffee or tea                        Coffee or tea  _____________________________________________________________________    BRUSH YOUR TEETH MORNING OF SURGERY AND RINSE YOUR MOUTH OUT, NO CHEWING GUM CANDY OR MINTS.     Take these medicines the morning of surgery with A SIP OF WATER: protonix , allopurinol                               You may not have any metal on your body including hair pins and              piercings  Do not wear jewelry, make-up, lotions, powders or perfumes, deodorant             Do not wear nail polish.  Do not shave  48 hours prior to surgery.               Do not bring valuables to the hospital. [place].  Contacts, dentures or bridgework may not be worn into surgery.        _____________________________________________________________________  [facility] - Preparing for Surgery Before surgery, you can play an important role.  Because skin is not sterile, your skin needs to be as free of germs as possible.  You can reduce the number of germs on your skin by washing with CHG (chlorahexidine gluconate) soap before surgery.  CHG is an antiseptic cleaner which kills germs and bonds with the skin to continue killing germs even after washing. Please DO NOT use if you have an allergy to CHG or antibacterial soaps.  If your skin becomes reddened/irritated stop using the CHG and inform your nurse when you arrive at Short Stay. Do not shave (including legs and underarms) for at least 48 hours prior to the first CHG shower.  You may shave your face/neck. Please follow these instructions carefully:  1.  Shower with CHG Soap the night before surgery and the  morning of Surgery.  2.  If you choose to wash your hair, wash your hair first as usual with your  normal  shampoo.  3.  After you shampoo, rinse your hair and body thoroughly to remove the  shampoo.                           4.  Use CHG as you  would any  other liquid soap.  You can apply chg directly  to the skin and wash                       Gently with a scrungie or clean washcloth.  5.  Apply the CHG Soap to your body ONLY FROM THE NECK DOWN.   Do not use on face/ open                           Wound or open sores. Avoid contact with eyes, ears mouth and genitals (private parts).                       Wash face,  Genitals (private parts) with your normal soap.             6.  Wash thoroughly, paying special attention to the area where your surgery  will be performed.  7.  Thoroughly rinse your body with warm water from the neck down.  8.  DO NOT shower/wash with your normal soap after using and rinsing off  the CHG Soap.                9.  Pat yourself dry with a clean towel.            10.  Wear clean pajamas.            11.  Place clean sheets on your bed the night of your first shower and do not  sleep with pets. Day of Surgery : Do not apply any lotions/deodorants the morning of surgery.  Please wear clean clothes to the hospital/surgery center.  FAILURE TO FOLLOW THESE INSTRUCTIONS MAY RESULT IN THE CANCELLATION OF YOUR SURGERY PATIENT SIGNATURE_________________________________  NURSE SIGNATURE__________________________________  ________________________________________________________________________   Olivia Powers  An incentive spirometer is a tool that can help keep your lungs clear and active. This tool measures how well you are filling your lungs with each breath. Taking long deep breaths may help reverse or decrease the chance of developing breathing (pulmonary) problems (especially infection) following:  A long period of time when you are unable to move or be active. BEFORE THE PROCEDURE   If the spirometer includes an indicator to show your best effort, your nurse or respiratory therapist will set it to a desired goal.  If possible, sit up straight or lean slightly forward. Try not to slouch.  Hold the  incentive spirometer in an upright position. INSTRUCTIONS FOR USE  1. Sit on the edge of your bed if possible, or sit up as far as you can in bed or on a chair. 2. Hold the incentive spirometer in an upright position. 3. Breathe out normally. 4. Place the mouthpiece in your mouth and seal your lips tightly around it. 5. Breathe in slowly and as deeply as possible, raising the piston or the ball toward the top of the column. 6. Hold your breath for 3-5 seconds or for as long as possible. Allow the piston or ball to fall to the bottom of the column. 7. Remove the mouthpiece from your mouth and breathe out normally. 8. Rest for a few seconds and repeat Steps 1 through 7 at least 10 times every 1-2 hours when you are awake. Take your time and take a few normal breaths between deep breaths. 9. The spirometer may include an indicator to show your best effort. Use the  indicator as a goal to work toward during each repetition. 10. After each set of 10 deep breaths, practice coughing to be sure your lungs are clear. If you have an incision (the cut made at the time of surgery), support your incision when coughing by placing a pillow or rolled up towels firmly against it. Once you are able to get out of bed, walk around indoors and cough well. You may stop using the incentive spirometer when instructed by your caregiver.  RISKS AND COMPLICATIONS  Take your time so you do not get dizzy or light-headed.  If you are in pain, you may need to take or ask for pain medication before doing incentive spirometry. It is harder to take a deep breath if you are having pain. AFTER USE  Rest and breathe slowly and easily.  It can be helpful to keep track of a log of your progress. Your caregiver can provide you with a simple table to help with this. If you are using the spirometer at home, follow these instructions: [place] IF:   You are having difficultly using the spirometer.  You have trouble using  the spirometer as often as instructed.  Your pain medication is not giving enough relief while using the spirometer.  You develop fever of 100.5 F (38.1 C) or higher. SEEK IMMEDIATE MEDICAL CARE IF:   You cough up bloody sputum that had not been present before.  You develop fever of 102 F (38.9 C) or greater.  You develop worsening pain at or near the incision site. MAKE SURE YOU:   Understand these instructions.  Will watch your condition.  Will get help right away if you are not doing well or get worse. Document Released: 05/31/2006 Document Revised: 04/12/2011 Document Reviewed: 08/01/2006 ExitCare Patient Information 2014 ExitCare, [place].   ________________________________________________________________________  WHAT IS A BLOOD TRANSFUSION? Blood Transfusion Information  A transfusion is the replacement of blood or some of its parts. Blood is made up of multiple cells which provide different functions.  Red blood cells carry oxygen and are used for blood loss replacement.  White blood cells fight against infection.  Platelets control bleeding.  Plasma helps clot blood.  Other blood products are available for specialized needs, such as hemophilia or other clotting disorders. BEFORE THE TRANSFUSION  Who gives blood for transfusions?   Healthy volunteers who are fully evaluated to make sure their blood is safe. This is blood bank blood. Transfusion therapy is the safest it has ever been in the practice of medicine. Before blood is taken from a donor, a complete history is taken to make sure that person has no history of diseases nor engages in risky social behavior (examples are intravenous drug use or sexual activity with multiple partners). The donor's travel history is screened to minimize risk of transmitting infections, such as malaria. The donated blood is tested for signs of infectious diseases, such as HIV and hepatitis. The blood is then tested to be sure it  is compatible with you in order to minimize the chance of a transfusion reaction. If you or a relative donates blood, this is often done in anticipation of surgery and is not appropriate for emergency situations. It takes many days to process the donated blood. RISKS AND COMPLICATIONS Although transfusion therapy is very safe and saves many lives, the main dangers of transfusion include:   Getting an infectious disease.  Developing a transfusion reaction. This is an allergic reaction to something in the blood you  were given. Every precaution is taken to prevent this. The decision to have a blood transfusion has been considered carefully by your caregiver before blood is given. Blood is not given unless the benefits outweigh the risks. AFTER THE TRANSFUSION  Right after receiving a blood transfusion, you will usually feel much better and more energetic. This is especially true if your red blood cells have gotten low (anemic). The transfusion raises the level of the red blood cells which carry oxygen, and this usually causes an energy increase.  The nurse administering the transfusion will monitor you carefully for complications. HOME CARE INSTRUCTIONS  No special instructions are needed after a transfusion. You may find your energy is better. Speak with your caregiver about any limitations on activity for underlying diseases you may have. SEEK MEDICAL CARE IF:   Your condition is not improving after your transfusion.  You develop redness or irritation at the intravenous (IV) site. SEEK IMMEDIATE MEDICAL CARE IF:  Any of the following symptoms occur over the next 12 hours:  Shaking chills.  You have a temperature by mouth above 102 F (38.9 C), not controlled by medicine.  Chest, back, or muscle pain.  People around you feel you are not acting correctly or are confused.  Shortness of breath or difficulty breathing.  Dizziness and fainting.  You get a rash or develop hives.  You  have a decrease in urine output.  Your urine turns a dark color or changes to pink, red, or brown. Any of the following symptoms occur over the next 10 days:  You have a temperature by mouth above 102 F (38.9 C), not controlled by medicine.  Shortness of breath.  Weakness after normal activity.  The white part of the eye turns yellow (jaundice).  You have a decrease in the amount of urine or are urinating less often.  Your urine turns a dark color or changes to pink, red, or brown. Document Released: 01/16/2000 Document Revised: 04/12/2011 Document Reviewed: 09/04/2007 [facility] Patient Information 2014 [place], [place].  _______________________________________________________________________

## 2018-08-19 ENCOUNTER — Other Ambulatory Visit (HOSPITAL_COMMUNITY)
Admission: RE | Admit: 2018-08-19 | Discharge: 2018-08-19 | Disposition: A | Discharge status: Home / Self Care | Payer: Medicare Other | Source: Ambulatory Visit | Attending: Orthopedic Surgery | Admitting: Orthopedic Surgery

## 2018-08-19 DIAGNOSIS — Z1159 Encounter for screening for other viral diseases: Secondary | ICD-10-CM | POA: Insufficient documentation

## 2018-08-19 LAB — SARS CORONAVIRUS 2 (TAT 6-24 HRS): SARS Coronavirus 2: NEGATIVE

## 2018-08-21 ENCOUNTER — Other Ambulatory Visit: Payer: Self-pay

## 2018-08-21 ENCOUNTER — Encounter (HOSPITAL_COMMUNITY)
Admission: RE | Admit: 2018-08-21 | Discharge: 2018-08-21 | Disposition: A | Discharge status: Home / Self Care | Payer: Medicare Other | Source: Ambulatory Visit | Attending: Orthopedic Surgery | Admitting: Orthopedic Surgery

## 2018-08-21 ENCOUNTER — Encounter (HOSPITAL_COMMUNITY): Payer: Self-pay

## 2018-08-21 DIAGNOSIS — I10 Essential (primary) hypertension: Secondary | ICD-10-CM | POA: Insufficient documentation

## 2018-08-21 DIAGNOSIS — E119 Type 2 diabetes mellitus without complications: Secondary | ICD-10-CM | POA: Insufficient documentation

## 2018-08-21 DIAGNOSIS — M1611 Unilateral primary osteoarthritis, right hip: Secondary | ICD-10-CM | POA: Insufficient documentation

## 2018-08-21 DIAGNOSIS — Z7982 Long term (current) use of aspirin: Secondary | ICD-10-CM | POA: Insufficient documentation

## 2018-08-21 DIAGNOSIS — Z01818 Encounter for other preprocedural examination: Secondary | ICD-10-CM | POA: Insufficient documentation

## 2018-08-21 DIAGNOSIS — Z79899 Other long term (current) drug therapy: Secondary | ICD-10-CM | POA: Insufficient documentation

## 2018-08-21 LAB — COMPREHENSIVE METABOLIC PANEL
ALT: 11 U/L (ref 0–44)
AST: 15 U/L (ref 15–41)
Albumin: 4.3 g/dL (ref 3.5–5.0)
Alkaline Phosphatase: 76 U/L (ref 38–126)
Anion gap: 8 (ref 5–15)
BUN: 17 mg/dL (ref 8–23)
CO2: 27 mmol/L (ref 22–32)
Calcium: 9.8 mg/dL (ref 8.9–10.3)
Chloride: 106 mmol/L (ref 98–111)
Creatinine, Ser: 0.95 mg/dL (ref 0.44–1.00)
GFR calc Af Amer: >60 mL/min (ref >60)
GFR calc non Af Amer: 57 mL/min — ABNORMAL LOW (ref >60)
Glucose, Bld: 102 mg/dL — ABNORMAL HIGH (ref 70–99)
Potassium: 4.5 mmol/L (ref 3.5–5.1)
Sodium: 141 mmol/L (ref 135–145)
Total Bilirubin: 1 mg/dL (ref 0.3–1.2)
Total Protein: 7.4 g/dL (ref 6.5–8.1)

## 2018-08-21 LAB — CBC
HCT: 44.1 % (ref 36.0–46.0)
Hemoglobin: 14 g/dL (ref 12.0–15.0)
MCH: 29 pg (ref 26.0–34.0)
MCHC: 31.7 g/dL (ref 30.0–36.0)
MCV: 91.3 fL (ref 80.0–100.0)
Platelets: 272 10*3/uL (ref 150–400)
RBC: 4.83 MIL/uL (ref 3.87–5.11)
RDW: 13.4 % (ref 11.5–15.5)
WBC: 7.3 10*3/uL (ref 4.0–10.5)
nRBC: 0 % (ref 0.0–0.2)

## 2018-08-21 LAB — GLUCOSE, CAPILLARY: Glucose-Capillary: 83 mg/dL (ref 70–99)

## 2018-08-21 LAB — PROTIME-INR
INR: 1 (ref 0.8–1.2)
Prothrombin Time: 12.6 seconds (ref 11.4–15.2)

## 2018-08-21 LAB — APTT: aPTT: 27 seconds (ref 24–36)

## 2018-08-21 LAB — HEMOGLOBIN A1C
Hgb A1c MFr Bld: 5.9 % — ABNORMAL HIGH (ref 4.8–5.6)
Mean Plasma Glucose: 122.63 mg/dL

## 2018-08-21 LAB — SURGICAL PCR SCREEN
MRSA, PCR: NEGATIVE
Staphylococcus aureus: NEGATIVE

## 2018-08-21 NOTE — Progress Notes (Signed)
Konrad Felix PA-C aware of EKG no further orders.

## 2018-08-22 NOTE — Progress Notes (Signed)
Pt. Aware of time change and will arrive at 640 am to admitting drink complete by 0600 am and NPO after 0600 am . Pt. VU

## 2018-08-22 NOTE — H&P (Signed)
TOTAL HIP ADMISSION H&P  Patient is admitted for right total hip arthroplasty.  Subjective:  Chief Complaint: right hip pain  HPI: Olivia Powers, 78 y.o. female, has a history of pain and functional disability in the right hip(s) due to arthritis and patient has failed non-surgical conservative treatments for greater than 12 weeks to include NSAID's and/or analgesics, corticosteriod injections, flexibility and strengthening excercises and activity modification.  Onset of symptoms was gradual starting 3 years ago with gradually worsening course since that time.The patient noted no past surgery on the right hip(s).  Patient currently rates pain in the right hip at 8 out of 10 with activity. Patient has night pain, worsening of pain with activity and weight bearing, pain that interfers with activities of daily living and pain with passive range of motion. Patient has evidence of subchondral cysts, subchondral sclerosis, periarticular osteophytes and joint space narrowing by imaging studies. This condition presents safety issues increasing the risk of falls. There is no current active infection.  Patient Active Problem List   Diagnosis Date Noted  . OA (osteoarthritis) of hip 07/06/2017  . Acute upper GI bleed 04/22/2014  . Gout   . Hypertension   . Diabetes mellitus without complication [facility])    Past Medical History:  Diagnosis Date  . Alopecia   . Arthritis   . Cancer ([place])    skin cancer of chest melanoma  . Diabetes mellitus without complication ([place])    taken off Metformin by PCP this year.  . Gout   . 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 Hypercholesteremia   . Hypertension     Past Surgical History:  Procedure Laterality Date  . ABDOMINAL HYSTERECTOMY    . BACK SURGERY     lower  . CATARACT EXTRACTION W/PHACO Left 02/11/2014   Procedure: CATARACT EXTRACTION PHACO AND INTRAOCULAR LENS PLACEMENT LEFT EYE;  Surgeon: Tonny Branch, MD;  Location: [facility]  ORS;  Service: Ophthalmology;  Laterality: Left;  CDE:4.93  . CATARACT EXTRACTION W/PHACO Right 03/11/2014   Procedure: CATARACT EXTRACTION PHACO AND INTRAOCULAR LENS PLACEMENT RIGHT EYE CDE=8.52;  Surgeon: Tonny Branch, MD;  Location: [facility] ORS;  Service: Ophthalmology;  Laterality: Right;  . CERVICAL DISC SURGERY    . CESAREAN SECTION     x2  . CRANIECTOMY FOR EXCISION OF ACOUSTIC NEUROMA Left 1990, 1992  . ESOPHAGOGASTRODUODENOSCOPY N/A 04/22/2014   Procedure: ESOPHAGOGASTRODUODENOSCOPY (EGD);  Surgeon: [name], MD;  Location: [place];  Service: Endoscopy;  Laterality: N/A;  . TOTAL HIP ARTHROPLASTY Left 07/06/2017   Procedure: LEFT TOTAL HIP ARTHROPLASTY ANTERIOR APPROACH;  Surgeon: Gaynelle Arabian, MD;  Location: [facility] ORS;  Service: Orthopedics;  Laterality: Left;  Failed Spinal  . TOTAL KNEE ARTHROPLASTY Bilateral 1990       Current Outpatient Medications  Medication Sig Dispense Refill Last Dose  . acetaminophen (TYLENOL) 500 MG tablet Take 1,000 mg by mouth every 6 (six) hours as needed for moderate pain or headache.      . allopurinol (ZYLOPRIM) 100 MG tablet Take 100 mg by mouth daily.     . diclofenac (VOLTAREN) 75 MG EC tablet Take 75 mg by mouth 2 (two) times daily as needed for moderate pain.     Marland Kitchen losartan (COZAAR) 25 MG tablet Take 25 mg by mouth daily.     . Melatonin 5 MG TABS Take 5 mg by mouth at bedtime.      . pantoprazole (PROTONIX) 40 MG tablet Take  40 mg by mouth daily.     . polyethylene glycol (MIRALAX / GLYCOLAX) packet Take 17 g by mouth daily.     . simvastatin (ZOCOR) 20 MG tablet Take 20 mg by mouth at bedtime.      . trolamine salicylate (ASPERCREME) 10 % cream Apply 1 application topically as needed (leg pain).     Marland Kitchen aspirin EC 325 MG EC tablet Take 1 tablet (325 mg total) by mouth 2 (two) times daily. To prevent blood clots (Patient not taking: Reported on 08/15/2018) 40 tablet 0 Not Taking at Unknown time  . methocarbamol (ROBAXIN) 500 MG tablet Take 1  tablet (500 mg total) by mouth every 6 (six) hours as needed for muscle spasms. (Patient not taking: Reported on 08/15/2018) 40 tablet 0 Not Taking at Unknown time  . omeprazole (PRILOSEC) 20 MG capsule Take 1 capsule (20 mg total) by mouth 2 (two) times daily before a meal. (Patient not taking: Reported on 06/30/2017) 60 capsule 1   . oxyCODONE (OXY IR/ROXICODONE) 5 MG immediate release tablet Take 1-2 tablets (5-10 mg total) by mouth every 6 (six) hours as needed for moderate pain (pain score 4-6). (Patient not taking: Reported on 08/15/2018) 56 tablet 0 Not Taking at Unknown time   Allergies  Allergen Reactions  . Codeine Nausea And Vomiting    GI upset   . Tramadol     hallucinations     Social History   Tobacco Use  . Smoking status: Never Smoker  . Smokeless tobacco: Never Used  Substance Use Topics  . Alcohol use: No      Review of Systems  Constitutional: Negative.   HENT: Positive for hearing loss. Negative for congestion, ear discharge, ear pain, nosebleeds, sinus pain, sore throat and tinnitus.   Eyes: Negative.   Respiratory: Negative.  Negative for stridor.   Cardiovascular: Negative.   Gastrointestinal: Negative.   Genitourinary: Negative.   Musculoskeletal: Positive for back pain, joint pain and myalgias. Negative for falls and neck pain.  Skin: Negative.   Neurological: Negative.   Endo/Heme/Allergies: Negative.   Psychiatric/Behavioral: Negative.     Objective:  Physical Exam  Constitutional: She is oriented to person, place, and time. She appears well-developed. No distress.  Obese  HENT:  Head: Normocephalic and atraumatic.  Right Ear: External ear normal.  Left Ear: External ear normal.  Nose: Nose normal.  Mouth/Throat: Oropharynx is clear and moist.  Eyes: Conjunctivae and EOM are normal.  Neck: Normal range of motion. Neck supple.  Cardiovascular: Normal rate, regular rhythm, normal heart sounds and intact distal pulses.  No murmur  heard. Respiratory: Effort normal and breath sounds normal. No respiratory distress. She has no wheezes.  GI: Soft. Bowel sounds are normal. She exhibits no distension. There is no abdominal tenderness.  Musculoskeletal:     Comments: Significantly antalgic gait pattern without the use of assisted devices.   Right Hip Exam: ROM: Flexion to 90 and minimal rotational motion. She has neutral extension with intense pain that radiates down to her knee.  There is positive tenderness over the greater trochanter bursa.   Right Knee Exam:  Well healed scar from previous surgery. Range of motion is 0-125 degrees.  Positive tenderness over the patella tendon with no deformity. Stable knee.  Neurological: She is alert and oriented to person, place, and time. She has normal strength. No sensory deficit.  Skin: No rash noted. She is not diaphoretic. No erythema.  Psychiatric: She has a normal mood and affect.  Her behavior is normal.    Vitals Ht: 5 ft  Wt: 169 lbs  BMI: 33  BP: 124/80 sitting L arm  Pulse: 76 bpm  Imaging Review Plain radiographs demonstrate severe degenerative joint disease of the right hip(s). The bone quality appears to be good for age and reported activity level.   Assessment/Plan:  End stage primary osteoarthritis, right hip(s)  The patient history, physical examination, clinical judgement of the provider and imaging studies are consistent with end stage degenerative joint disease of the right hip(s) and total hip arthroplasty is deemed medically necessary. The treatment options including medical management, injection therapy, arthroscopy and arthroplasty were discussed at length. The risks and benefits of total hip arthroplasty were presented and reviewed. The risks due to aseptic loosening, infection, stiffness, dislocation/subluxation,  thromboembolic complications and other imponderables were discussed.  The patient acknowledged the explanation, agreed to proceed with  the plan and consent was signed. Patient is being admitted for inpatient treatment for surgery, pain control, PT, OT, prophylactic antibiotics, VTE prophylaxis, progressive ambulation and ADL's and discharge planning.The patient is planning to be discharged home with HEP.   Risks and benefits of the surgery were discussed with the patient and Dr.Aluisio at their previous office visit, and the patient has elected to move forward with the aforementioned surgery. Post-operative care plans were discussed with the patient today.  Therapy Plans: HEP Disposition: Home with friend Planned DVT prophylaxis: aspirin 325mg  BID DME needed: has equipment PCP: Dr. 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  Questions and concerns were welcomed and addressed. If they have any further questions or concerns that arise, they are instructed to call the office at any time.  Ardeen Jourdain, PA-C

## 2018-08-23 ENCOUNTER — Inpatient Hospital Stay (HOSPITAL_COMMUNITY): Payer: Medicare Other

## 2018-08-23 ENCOUNTER — Encounter (HOSPITAL_COMMUNITY): Payer: Self-pay | Admitting: *Deleted

## 2018-08-23 ENCOUNTER — Inpatient Hospital Stay (HOSPITAL_COMMUNITY)
Admission: RE | Admit: 2018-08-23 | Discharge: 2018-08-25 | DRG: 470 | Disposition: A | Discharge status: Home / Self Care | Payer: Medicare Other | Attending: Orthopedic Surgery | Admitting: Orthopedic Surgery

## 2018-08-23 ENCOUNTER — Other Ambulatory Visit: Payer: Self-pay

## 2018-08-23 ENCOUNTER — Encounter (HOSPITAL_COMMUNITY)
Admission: RE | Disposition: A | Discharge status: Home / Self Care | Payer: Self-pay | Source: Home / Self Care | Attending: Orthopedic Surgery

## 2018-08-23 ENCOUNTER — Inpatient Hospital Stay (HOSPITAL_COMMUNITY): Payer: Medicare Other | Admitting: Anesthesiology

## 2018-08-23 ENCOUNTER — Inpatient Hospital Stay (HOSPITAL_COMMUNITY): Payer: Medicare Other | Admitting: Physician Assistant

## 2018-08-23 DIAGNOSIS — M25552 Pain in left hip: Secondary | ICD-10-CM

## 2018-08-23 DIAGNOSIS — Z85820 Personal history of malignant melanoma of skin: Secondary | ICD-10-CM | POA: Diagnosis not present

## 2018-08-23 DIAGNOSIS — Z6834 Body mass index (BMI) 34.0-34.9, adult: Secondary | ICD-10-CM | POA: Diagnosis not present

## 2018-08-23 DIAGNOSIS — M1611 Unilateral primary osteoarthritis, right hip: Secondary | ICD-10-CM

## 2018-08-23 DIAGNOSIS — I10 Essential (primary) hypertension: Secondary | ICD-10-CM | POA: Diagnosis present

## 2018-08-23 DIAGNOSIS — E7800 Pure hypercholesterolemia, unspecified: Secondary | ICD-10-CM | POA: Diagnosis present

## 2018-08-23 DIAGNOSIS — K219 Gastro-esophageal reflux disease without esophagitis: Secondary | ICD-10-CM | POA: Diagnosis present

## 2018-08-23 DIAGNOSIS — M169 Osteoarthritis of hip, unspecified: Secondary | ICD-10-CM | POA: Diagnosis present

## 2018-08-23 DIAGNOSIS — Z90710 Acquired absence of both cervix and uterus: Secondary | ICD-10-CM

## 2018-08-23 DIAGNOSIS — Z791 Long term (current) use of non-steroidal anti-inflammatories (NSAID): Secondary | ICD-10-CM | POA: Diagnosis not present

## 2018-08-23 DIAGNOSIS — Z79899 Other long term (current) drug therapy: Secondary | ICD-10-CM | POA: Diagnosis not present

## 2018-08-23 DIAGNOSIS — H9190 Unspecified hearing loss, unspecified ear: Secondary | ICD-10-CM | POA: Diagnosis present

## 2018-08-23 DIAGNOSIS — E119 Type 2 diabetes mellitus without complications: Secondary | ICD-10-CM | POA: Diagnosis present

## 2018-08-23 DIAGNOSIS — Z96653 Presence of artificial knee joint, bilateral: Secondary | ICD-10-CM | POA: Diagnosis present

## 2018-08-23 DIAGNOSIS — Z7982 Long term (current) use of aspirin: Secondary | ICD-10-CM

## 2018-08-23 DIAGNOSIS — Z96649 Presence of unspecified artificial hip joint: Secondary | ICD-10-CM

## 2018-08-23 HISTORY — PX: TOTAL HIP ARTHROPLASTY: SHX124

## 2018-08-23 LAB — TYPE AND SCREEN
ABO/RH(D): A POS
Antibody Screen: NEGATIVE

## 2018-08-23 LAB — GLUCOSE, CAPILLARY
Glucose-Capillary: 106 mg/dL — ABNORMAL HIGH (ref 70–99)
Glucose-Capillary: 134 mg/dL — ABNORMAL HIGH (ref 70–99)

## 2018-08-23 SURGERY — ARTHROPLASTY, HIP, TOTAL, ANTERIOR APPROACH
Anesthesia: Spinal | Laterality: Right

## 2018-08-23 MED ORDER — HYDROCODONE-ACETAMINOPHEN 5-325 MG PO TABS
1.0000 | ORAL_TABLET | ORAL | Status: DC | PRN
  Administered 2018-08-23 – 2018-08-25 (×3): 1 via ORAL
  Filled 2018-08-23 (×2): qty 1

## 2018-08-23 MED ORDER — ONDANSETRON HCL 4 MG/2ML IJ SOLN: 4.0000 mg | Freq: Four times a day (QID) | INTRAMUSCULAR | Status: DC | PRN

## 2018-08-23 MED ORDER — PHENYLEPHRINE 40 MCG/ML (10ML) SYRINGE FOR IV PUSH (FOR BLOOD PRESSURE SUPPORT)
PREFILLED_SYRINGE | INTRAVENOUS | Status: DC | PRN
  Administered 2018-08-23 (×2): 40 ug via INTRAVENOUS

## 2018-08-23 MED ORDER — SODIUM CHLORIDE 0.9 % IV SOLN
INTRAVENOUS | Status: DC
  Administered 2018-08-23 – 2018-08-24 (×2): via INTRAVENOUS

## 2018-08-23 MED ORDER — BISACODYL 10 MG RE SUPP: 10.0000 mg | Freq: Every day | RECTAL | Status: DC | PRN

## 2018-08-23 MED ORDER — METHOCARBAMOL 500 MG PO TABS
500.0000 mg | ORAL_TABLET | Freq: Four times a day (QID) | ORAL | Status: DC | PRN
  Administered 2018-08-23 – 2018-08-25 (×5): 500 mg via ORAL
  Filled 2018-08-23 (×6): qty 1

## 2018-08-23 MED ORDER — DEXAMETHASONE SODIUM PHOSPHATE 10 MG/ML IJ SOLN
INTRAMUSCULAR | Status: AC
  Filled 2018-08-23: qty 1

## 2018-08-23 MED ORDER — MORPHINE SULFATE (PF) 4 MG/ML IV SOLN
0.5000 mg | INTRAVENOUS | Status: DC | PRN
  Administered 2018-08-23 (×2): 1 mg via INTRAVENOUS
  Filled 2018-08-23 (×2): qty 1

## 2018-08-23 MED ORDER — PHENOL 1.4 % MT LIQD: 1.0000 | OROMUCOSAL | Status: DC | PRN

## 2018-08-23 MED ORDER — DEXAMETHASONE SODIUM PHOSPHATE 10 MG/ML IJ SOLN
8.0000 mg | Freq: Once | INTRAMUSCULAR | Status: AC
  Administered 2018-08-23: 8 mg via INTRAVENOUS

## 2018-08-23 MED ORDER — METOCLOPRAMIDE HCL 5 MG/ML IJ SOLN: 5.0000 mg | Freq: Three times a day (TID) | INTRAMUSCULAR | Status: DC | PRN

## 2018-08-23 MED ORDER — DOCUSATE SODIUM 100 MG PO CAPS
100.0000 mg | ORAL_CAPSULE | Freq: Two times a day (BID) | ORAL | Status: DC
  Administered 2018-08-23 – 2018-08-25 (×4): 100 mg via ORAL
  Filled 2018-08-23 (×4): qty 1

## 2018-08-23 MED ORDER — PROPOFOL 10 MG/ML IV BOLUS
INTRAVENOUS | Status: DC | PRN
  Administered 2018-08-23: 20 mg via INTRAVENOUS

## 2018-08-23 MED ORDER — CHLORHEXIDINE GLUCONATE 4 % EX LIQD: 60.0000 mL | Freq: Once | CUTANEOUS | Status: DC

## 2018-08-23 MED ORDER — LOSARTAN POTASSIUM 25 MG PO TABS
25.0000 mg | ORAL_TABLET | Freq: Every day | ORAL | Status: DC
  Administered 2018-08-24 – 2018-08-25 (×2): 25 mg via ORAL
  Filled 2018-08-23 (×2): qty 1

## 2018-08-23 MED ORDER — LIDOCAINE 2% (20 MG/ML) 5 ML SYRINGE
INTRAMUSCULAR | Status: AC
  Filled 2018-08-23: qty 5

## 2018-08-23 MED ORDER — BUPIVACAINE-EPINEPHRINE (PF) 0.25% -1:200000 IJ SOLN
INTRAMUSCULAR | Status: DC | PRN
  Administered 2018-08-23: 30 mL

## 2018-08-23 MED ORDER — ONDANSETRON HCL 4 MG/2ML IJ SOLN
INTRAMUSCULAR | Status: DC | PRN
  Administered 2018-08-23: 4 mg via INTRAVENOUS

## 2018-08-23 MED ORDER — PROMETHAZINE HCL 25 MG/ML IJ SOLN: 6.2500 mg | INTRAMUSCULAR | Status: DC | PRN

## 2018-08-23 MED ORDER — METHOCARBAMOL 500 MG IVPB - SIMPLE MED
500.0000 mg | Freq: Four times a day (QID) | INTRAVENOUS | Status: DC | PRN
  Administered 2018-08-23: 500 mg via INTRAVENOUS
  Filled 2018-08-23: qty 50

## 2018-08-23 MED ORDER — ACETAMINOPHEN 10 MG/ML IV SOLN
1000.0000 mg | Freq: Four times a day (QID) | INTRAVENOUS | Status: DC
  Administered 2018-08-23: 1000 mg via INTRAVENOUS
  Filled 2018-08-23: qty 100

## 2018-08-23 MED ORDER — MENTHOL 3 MG MT LOZG: 1.0000 | LOZENGE | OROMUCOSAL | Status: DC | PRN

## 2018-08-23 MED ORDER — PROPOFOL 10 MG/ML IV BOLUS
INTRAVENOUS | Status: AC
  Filled 2018-08-23: qty 60

## 2018-08-23 MED ORDER — PROPOFOL 10 MG/ML IV BOLUS
INTRAVENOUS | Status: AC
  Filled 2018-08-23: qty 20

## 2018-08-23 MED ORDER — DEXAMETHASONE SODIUM PHOSPHATE 10 MG/ML IJ SOLN
10.0000 mg | Freq: Once | INTRAMUSCULAR | Status: AC
  Administered 2018-08-24: 10 mg via INTRAVENOUS
  Filled 2018-08-23: qty 1

## 2018-08-23 MED ORDER — FENTANYL CITRATE (PF) 100 MCG/2ML IJ SOLN
25.0000 ug | INTRAMUSCULAR | Status: DC | PRN
  Administered 2018-08-23 (×3): 50 ug via INTRAVENOUS

## 2018-08-23 MED ORDER — ONDANSETRON HCL 4 MG/2ML IJ SOLN
INTRAMUSCULAR | Status: AC
  Filled 2018-08-23: qty 2

## 2018-08-23 MED ORDER — FENTANYL CITRATE (PF) 100 MCG/2ML IJ SOLN
INTRAMUSCULAR | Status: DC | PRN
  Administered 2018-08-23: 25 ug via INTRAVENOUS
  Administered 2018-08-23: 50 ug via INTRAVENOUS
  Administered 2018-08-23: 25 ug via INTRAVENOUS

## 2018-08-23 MED ORDER — ONDANSETRON HCL 4 MG PO TABS: 4.0000 mg | ORAL_TABLET | Freq: Four times a day (QID) | ORAL | Status: DC | PRN

## 2018-08-23 MED ORDER — LACTATED RINGERS IV SOLN
INTRAVENOUS | Status: DC
  Administered 2018-08-23: 07:00:00 via INTRAVENOUS

## 2018-08-23 MED ORDER — ALLOPURINOL 100 MG PO TABS
100.0000 mg | ORAL_TABLET | Freq: Every day | ORAL | Status: DC
  Administered 2018-08-24 – 2018-08-25 (×2): 100 mg via ORAL
  Filled 2018-08-23 (×2): qty 1

## 2018-08-23 MED ORDER — BUPIVACAINE-EPINEPHRINE (PF) 0.25% -1:200000 IJ SOLN
INTRAMUSCULAR | Status: AC
  Filled 2018-08-23: qty 30

## 2018-08-23 MED ORDER — HYDROCODONE-ACETAMINOPHEN 5-325 MG PO TABS
ORAL_TABLET | ORAL | Status: AC
  Filled 2018-08-23: qty 1

## 2018-08-23 MED ORDER — PANTOPRAZOLE SODIUM 40 MG PO TBEC
40.0000 mg | DELAYED_RELEASE_TABLET | Freq: Every day | ORAL | Status: DC
  Administered 2018-08-24 – 2018-08-25 (×2): 40 mg via ORAL
  Filled 2018-08-23 (×2): qty 1

## 2018-08-23 MED ORDER — FLEET ENEMA 7-19 GM/118ML RE ENEM
1.0000 | ENEMA | Freq: Once | RECTAL | Status: DC | PRN
  Filled 2018-08-23: qty 1

## 2018-08-23 MED ORDER — EPHEDRINE 5 MG/ML INJ
INTRAVENOUS | Status: AC
  Filled 2018-08-23: qty 10

## 2018-08-23 MED ORDER — BUPIVACAINE IN DEXTROSE 0.75-8.25 % IT SOLN
INTRATHECAL | Status: DC | PRN
  Administered 2018-08-23: 1.4 mL via INTRATHECAL

## 2018-08-23 MED ORDER — SIMVASTATIN 20 MG PO TABS
20.0000 mg | ORAL_TABLET | Freq: Every day | ORAL | Status: DC
  Administered 2018-08-23 – 2018-08-24 (×2): 20 mg via ORAL
  Filled 2018-08-23 (×2): qty 1

## 2018-08-23 MED ORDER — DIPHENHYDRAMINE HCL 12.5 MG/5ML PO ELIX
12.5000 mg | ORAL_SOLUTION | ORAL | Status: DC | PRN
  Administered 2018-08-23: 25 mg via ORAL
  Filled 2018-08-23: qty 10

## 2018-08-23 MED ORDER — TRANEXAMIC ACID-NACL 1000-0.7 MG/100ML-% IV SOLN
1000.0000 mg | INTRAVENOUS | Status: AC
  Administered 2018-08-23: 1000 mg via INTRAVENOUS
  Filled 2018-08-23: qty 100

## 2018-08-23 MED ORDER — POLYETHYLENE GLYCOL 3350 17 G PO PACK: 17.0000 g | PACK | Freq: Every day | ORAL | Status: DC | PRN

## 2018-08-23 MED ORDER — CEFAZOLIN SODIUM-DEXTROSE 2-4 GM/100ML-% IV SOLN
2.0000 g | Freq: Four times a day (QID) | INTRAVENOUS | Status: AC
  Administered 2018-08-23 (×2): 2 g via INTRAVENOUS
  Filled 2018-08-23 (×2): qty 100

## 2018-08-23 MED ORDER — ACETAMINOPHEN 500 MG PO TABS
500.0000 mg | ORAL_TABLET | Freq: Four times a day (QID) | ORAL | Status: AC
  Administered 2018-08-23 – 2018-08-24 (×4): 500 mg via ORAL
  Filled 2018-08-23 (×4): qty 1

## 2018-08-23 MED ORDER — FENTANYL CITRATE (PF) 100 MCG/2ML IJ SOLN
INTRAMUSCULAR | Status: AC
  Filled 2018-08-23: qty 2

## 2018-08-23 MED ORDER — ASPIRIN EC 325 MG PO TBEC
325.0000 mg | DELAYED_RELEASE_TABLET | Freq: Two times a day (BID) | ORAL | Status: DC
  Administered 2018-08-24 – 2018-08-25 (×3): 325 mg via ORAL
  Filled 2018-08-23 (×3): qty 1

## 2018-08-23 MED ORDER — METHOCARBAMOL 500 MG IVPB - SIMPLE MED
INTRAVENOUS | Status: AC
  Filled 2018-08-23: qty 50

## 2018-08-23 MED ORDER — METOCLOPRAMIDE HCL 5 MG PO TABS: 5.0000 mg | ORAL_TABLET | Freq: Three times a day (TID) | ORAL | Status: DC | PRN

## 2018-08-23 MED ORDER — FENTANYL CITRATE (PF) 100 MCG/2ML IJ SOLN
INTRAMUSCULAR | Status: AC
  Filled 2018-08-23: qty 4

## 2018-08-23 MED ORDER — POVIDONE-IODINE 10 % EX SWAB
2.0000 "application " | Freq: Once | CUTANEOUS | Status: AC
  Administered 2018-08-23: 2 via TOPICAL

## 2018-08-23 MED ORDER — MEPERIDINE HCL 50 MG/ML IJ SOLN: 6.2500 mg | INTRAMUSCULAR | Status: DC | PRN

## 2018-08-23 MED ORDER — PROPOFOL 500 MG/50ML IV EMUL
INTRAVENOUS | Status: DC | PRN
  Administered 2018-08-23: 75 ug/kg/min via INTRAVENOUS

## 2018-08-23 MED ORDER — CEFAZOLIN SODIUM-DEXTROSE 2-4 GM/100ML-% IV SOLN
2.0000 g | INTRAVENOUS | Status: AC
  Administered 2018-08-23: 2 g via INTRAVENOUS
  Filled 2018-08-23: qty 100

## 2018-08-23 MED ORDER — EPHEDRINE SULFATE-NACL 50-0.9 MG/10ML-% IV SOSY
PREFILLED_SYRINGE | INTRAVENOUS | Status: DC | PRN
  Administered 2018-08-23 (×4): 5 mg via INTRAVENOUS

## 2018-08-23 MED ORDER — HYDROCODONE-ACETAMINOPHEN 7.5-325 MG PO TABS
1.0000 | ORAL_TABLET | ORAL | Status: DC | PRN
  Administered 2018-08-23 – 2018-08-25 (×6): 1 via ORAL
  Filled 2018-08-23 (×6): qty 1

## 2018-08-23 MED ORDER — MIDAZOLAM HCL 2 MG/2ML IJ SOLN: 0.5000 mg | Freq: Once | INTRAMUSCULAR | Status: DC | PRN

## 2018-08-23 SURGICAL SUPPLY — 45 items
BAG DECANTER FOR FLEXI CONT (MISCELLANEOUS) IMPLANT
BAG ZIPLOCK 12X15 (MISCELLANEOUS) IMPLANT
BLADE SAG 18X100X1.27 (BLADE) ×3 IMPLANT
CLOSURE WOUND 1/2 X4 (GAUZE/BANDAGES/DRESSINGS) ×1
COVER PERINEAL POST (MISCELLANEOUS) ×3 IMPLANT
COVER SURGICAL LIGHT HANDLE (MISCELLANEOUS) ×3 IMPLANT
COVER WAND RF STERILE (DRAPES) IMPLANT
CUP ACETBLR 48 OD SECTOR II (Hips) ×2 IMPLANT
DECANTER SPIKE VIAL GLASS SM (MISCELLANEOUS) ×3 IMPLANT
DRAPE STERI IOBAN 125X83 (DRAPES) ×3 IMPLANT
DRAPE U-SHAPE 47X51 STRL (DRAPES) ×6 IMPLANT
DRSG ADAPTIC 3X8 NADH LF (GAUZE/BANDAGES/DRESSINGS) ×3 IMPLANT
DRSG MEPILEX BORDER 4X4 (GAUZE/BANDAGES/DRESSINGS) ×3 IMPLANT
DRSG MEPILEX BORDER 4X8 (GAUZE/BANDAGES/DRESSINGS) ×3 IMPLANT
DURAPREP 26ML APPLICATOR (WOUND CARE) ×3 IMPLANT
ELECT REM PT RETURN 15FT ADLT (MISCELLANEOUS) ×3 IMPLANT
EVACUATOR 1/8 PVC DRAIN (DRAIN) ×3 IMPLANT
GLOVE BIO SURGEON STRL SZ 6 (GLOVE) IMPLANT
GLOVE BIO SURGEON STRL SZ7 (GLOVE) IMPLANT
GLOVE BIO SURGEON STRL SZ8 (GLOVE) ×13 IMPLANT
GLOVE BIOGEL PI IND STRL 6.5 (GLOVE) IMPLANT
GLOVE BIOGEL PI IND STRL 7.0 (GLOVE) IMPLANT
GLOVE BIOGEL PI IND STRL 8 (GLOVE) ×1 IMPLANT
GLOVE BIOGEL PI INDICATOR 6.5 (GLOVE)
GLOVE BIOGEL PI INDICATOR 7.0 (GLOVE)
GLOVE BIOGEL PI INDICATOR 8 (GLOVE) ×2
GOWN STRL REUS W/TWL LRG LVL3 (GOWN DISPOSABLE) ×9 IMPLANT
GOWN STRL REUS W/TWL XL LVL3 (GOWN DISPOSABLE) IMPLANT
HEAD FEM STD 28X+1.5 STRL (Hips) ×2 IMPLANT
HOLDER FOLEY CATH W/STRAP (MISCELLANEOUS) ×3 IMPLANT
KIT TURNOVER KIT A (KITS) IMPLANT
LINER MARATHON 28 48 (Hips) ×2 IMPLANT
MANIFOLD NEPTUNE II (INSTRUMENTS) ×3 IMPLANT
PACK ANTERIOR HIP CUSTOM (KITS) ×3 IMPLANT
STEM FEM ACTIS HIGH SZ3 (Stem) ×2 IMPLANT
STRIP CLOSURE SKIN 1/2X4 (GAUZE/BANDAGES/DRESSINGS) ×2 IMPLANT
SUT ETHIBOND NAB CT1 #1 30IN (SUTURE) ×3 IMPLANT
SUT MNCRL AB 4-0 PS2 18 (SUTURE) ×3 IMPLANT
SUT STRATAFIX 0 PDS 27 VIOLET (SUTURE) ×3
SUT VIC AB 2-0 CT1 27 (SUTURE) ×4
SUT VIC AB 2-0 CT1 TAPERPNT 27 (SUTURE) ×2 IMPLANT
SUTURE STRATFX 0 PDS 27 VIOLET (SUTURE) ×1 IMPLANT
SYR 50ML LL SCALE MARK (SYRINGE) IMPLANT
TRAY FOLEY MTR SLVR 16FR STAT (SET/KITS/TRAYS/PACK) ×3 IMPLANT
YANKAUER SUCT BULB TIP 10FT TU (MISCELLANEOUS) ×3 IMPLANT

## 2018-08-23 NOTE — Anesthesia Procedure Notes (Signed)
Spinal  Patient location during procedure: OB End time: 08/23/2018 8:39 AM Staffing Anesthesiologist: Annye Asa, MD Performed: anesthesiologist  Preanesthetic Checklist Completed: patient identified, site marked, surgical consent, pre-op evaluation, timeout performed, IV checked, risks and benefits discussed and monitors and equipment checked Spinal Block Patient position: sitting Prep: site prepped and draped and DuraPrep Patient monitoring: blood pressure, continuous pulse ox, cardiac monitor and heart rate Approach: midline Location: L2-3 Injection technique: single-shot Needle Needle type: Pencan  Needle gauge: 24 G Needle length: 9 cm Additional Notes Pt identified in Operating room.  Monitors applied. Working IV access confirmed. Sterile prep, drape lumbar spine.  1% lido local L 2,3.  #24ga Pencan into clear CSF L 2,3.  10.5mg  7.5% Bupivacaine with dextrose injected with asp CSF beginning and end of injection.  Patient asymptomatic, VSS, no heme aspirated, tolerated well.  Jenita Seashore, MD

## 2018-08-23 NOTE — Anesthesia Procedure Notes (Signed)
Procedure Name: MAC Date/Time: 08/23/2018 8:33 AM Performed by: Eben Burow, CRNA Pre-anesthesia Checklist: Patient identified, Emergency Drugs available, Suction available, Patient being monitored and Timeout performed Oxygen Delivery Method: Simple face mask Dental Injury: Teeth and Oropharynx as per pre-operative assessment

## 2018-08-23 NOTE — Discharge Instructions (Signed)
°Dr. Frank Aluisio °Total Joint Specialist °[facility] °[address] °[place], [place] [address] °[phone] ° °ANTERIOR APPROACH TOTAL HIP REPLACEMENT POSTOPERATIVE DIRECTIONS ° ° °Hip Rehabilitation, Guidelines Following Surgery  °The results of a hip operation are greatly improved after range of motion and muscle strengthening exercises. Follow all safety measures which are given to protect your hip. If any of these exercises cause increased pain or swelling in your joint, decrease the amount until you are comfortable again. Then slowly increase the exercises. Call your caregiver if you have problems or questions.  ° °HOME CARE INSTRUCTIONS  °• Remove items at home which could result in a fall. This includes throw rugs or furniture in walking pathways.  °· ICE to the affected hip every three hours for 30 minutes at a time and then as needed for pain and swelling.  Continue to use ice on the hip for pain and swelling from surgery. You may notice swelling that will progress down to the foot and ankle.  This is normal after surgery.  Elevate the leg when you are not up walking on it.   °· Continue to use the breathing machine which will help keep your temperature down.  It is common for your temperature to cycle up and down following surgery, especially at night when you are not up moving around and exerting yourself.  The breathing machine keeps your lungs expanded and your temperature down. ° °DIET °You may resume your previous home diet once your are discharged from the hospital. ° °DRESSING / WOUND CARE / SHOWERING °You may change your dressing 3-5 days after surgery.  Then change the dressing every day with sterile gauze.  Please use good hand washing techniques before changing the dressing.  Do not use any lotions or creams on the incision until instructed by your surgeon. °You may start showering once you are discharged home but do not submerge the incision under water. Just pat the  incision dry and apply a dry gauze dressing on daily. °Change the surgical dressing daily and reapply a dry dressing each time. ° °ACTIVITY °Walk with your walker as instructed. °Use walker as long as suggested by your caregivers. °Avoid periods of inactivity such as sitting longer than an hour when not asleep. This helps prevent blood clots.  °You may resume a sexual relationship in one month or when given the OK by your doctor.  °You may return to work once you are cleared by your doctor.  °Do not drive a car for 6 weeks or until released by you surgeon.  °Do not drive while taking narcotics. ° °WEIGHT BEARING °Weight bearing as tolerated with assist device (walker, cane, etc) as directed, use it as long as suggested by your surgeon or therapist, typically at least 4-6 weeks. ° °POSTOPERATIVE CONSTIPATION PROTOCOL °Constipation - defined medically as fewer than three stools per week and severe constipation as less than one stool per week. ° °One of the most common issues patients have following surgery is constipation.  Even if you have a regular bowel pattern at home, your normal regimen is likely to be disrupted due to multiple reasons following surgery.  Combination of anesthesia, postoperative narcotics, change in appetite and fluid intake all can affect your bowels.  In order to avoid complications following surgery, here are some recommendations in order to help you during your recovery period. ° °Colace (docusate) - Pick up an over-the-counter form of Colace or another stool softener and take twice a day   as long as you are requiring postoperative pain medications.  Take with a full glass of water daily.  If you experience loose stools or diarrhea, hold the colace until you stool forms back up.  If your symptoms do not get better within 1 week or if they get worse, check with your doctor. ° °Dulcolax (bisacodyl) - Pick up over-the-counter and take as directed by the product packaging as needed to assist with  the movement of your bowels.  Take with a full glass of water.  Use this product as needed if not relieved by Colace only.  ° °MiraLax (polyethylene glycol) - Pick up over-the-counter to have on hand.  MiraLax is a solution that will increase the amount of water in your bowels to assist with bowel movements.  Take as directed and can mix with a glass of water, juice, soda, coffee, or tea.  Take if you go more than two days without a movement. °Do not use MiraLax more than once per day. Call your doctor if you are still constipated or irregular after using this medication for 7 days in a row. ° °If you continue to have problems with postoperative constipation, please contact the office for further assistance and recommendations.  If you experience "the worst abdominal pain ever" or develop nausea or vomiting, please contact the office immediatly for further recommendations for treatment. ° °ITCHING ° If you experience itching with your medications, try taking only a single pain pill, or even half a pain pill at a time.  You can also use Benadryl over the counter for itching or also to help with sleep.  ° °TED HOSE STOCKINGS °Wear the elastic stockings on both legs for three weeks following surgery during the day but you may remove then at night for sleeping. ° °MEDICATIONS °See your medication summary on the “After Visit Summary” that the nursing staff will review with you prior to discharge.  You may have some home medications which will be placed on hold until you complete the course of blood thinner medication.  It is important for you to complete the blood thinner medication as prescribed by your surgeon.  Continue your approved medications as instructed at time of discharge. ° °PRECAUTIONS °If you experience chest pain or shortness of breath - call 911 immediately for transfer to the hospital emergency department.  °If you develop a fever greater that 101 F, purulent drainage from wound, increased redness or  drainage from wound, foul odor from the wound/dressing, or calf pain - CONTACT YOUR SURGEON.   °                                                °FOLLOW-UP APPOINTMENTS °Make sure you keep all of your appointments after your operation with your surgeon and caregivers. You should call the office at the above phone number and make an appointment for approximately two weeks after the date of your surgery or on the date instructed by your surgeon outlined in the "After Visit Summary". ° °RANGE OF MOTION AND STRENGTHENING EXERCISES  °These exercises are designed to help you keep full movement of your hip joint. Follow your caregiver's or physical therapist's instructions. Perform all exercises about fifteen times, three times per day or as directed. Exercise both hips, even if you have had only one joint replacement. These exercises can be done on   a training (exercise) mat, on the floor, on a table or on a bed. Use whatever works the best and is most comfortable for you. Use music or television while you are exercising so that the exercises are a pleasant break in your day. This will make your life better with the exercises acting as a break in routine you can look forward to.   Lying on your back, slowly slide your foot toward your buttocks, raising your knee up off the floor. Then slowly slide your foot back down until your leg is straight again.   Lying on your back spread your legs as far apart as you can without causing discomfort.   Lying on your side, raise your upper leg and foot straight up from the floor as far as is comfortable. Slowly lower the leg and repeat.   Lying on your back, tighten up the muscle in the front of your thigh (quadriceps muscles). You can do this by keeping your leg straight and trying to raise your heel off the floor. This helps strengthen the largest muscle supporting your knee.   Lying on your back, tighten up the muscles of your buttocks both with the legs straight and with  the knee bent at a comfortable angle while keeping your heel on the floor.   IF YOU ARE TRANSFERRED TO A SKILLED REHAB FACILITY If the patient is transferred to a skilled rehab facility following release from the hospital, a list of the current medications will be sent to the facility for the patient to continue.  When discharged from the skilled rehab facility, please have the facility set up the patient's [place] prior to being released. Also, the skilled facility will be responsible for providing the patient with their medications at time of release from the facility to include their pain medication, the muscle relaxants, and their blood thinner medication. If the patient is still at the rehab facility at time of the two week follow up appointment, the skilled rehab facility will also need to assist the patient in arranging follow up appointment in our office and any transportation needs.  MAKE SURE YOU:   Understand these instructions.   Get help right away if you are not doing well or get worse.    Pick up stool softner and laxative for home use following surgery while on pain medications. Do not submerge incision under water. Please use good hand washing techniques while changing dressing each day. May shower starting three days after surgery. Please use a clean towel to pat the incision dry following showers. Continue to use ice for pain and swelling after surgery. Do not use any lotions or creams on the incision until instructed by your surgeon.

## 2018-08-23 NOTE — Evaluation (Signed)
Physical Therapy Evaluation Patient Details Name: Olivia Powers MRN: [id number] DOB: 01-06-1941 Today's Date: 08/23/2018   History of Present Illness  78 yo female s/p R DA THA. PMH: L DA THA ~2018, bil TKA, back surgery,  HTN  Clinical Impression  Pt is s/p THA resulting in the deficits listed below (see PT Problem List).  Pt limited by pain today but very motivated and willing to work within limits of pain.  Will continue to follow in acute setting.   Pt will benefit from skilled PT to increase their independence and safety with mobility to allow discharge to the venue listed below.      Follow Up Recommendations Follow surgeon's recommendation for DC plan and follow-up therapies    Equipment Recommendations  None recommended by PT    Recommendations for Other Services       Precautions / Restrictions Precautions Precautions: Fall Restrictions Weight Bearing Restrictions: No Other Position/Activity Restrictions: WBAT      Mobility  Bed Mobility Overal bed mobility: Needs Assistance Bed Mobility: Supine to Sit;Sit to Supine     Supine to sit: Mod assist Sit to supine: Mod assist   General bed mobility comments: assist with trunk and RLE to upright, assist with bil LEs onto bed  Transfers Overall transfer level: Needs assistance Equipment used: Rolling walker (2 wheeled) Transfers: Sit to/from Stand Sit to Stand: Min assist         General transfer comment: assist for anterior-superior wt shift, cues for hand placement  Ambulation/Gait             General Gait Details: lateral steps along EOB with RW, min assist to maneuver RW and for balance, incr time needed  Stairs            Wheelchair Mobility    Modified Rankin (Stroke Patients Only)       Balance                                             Pertinent Vitals/Pain Pain Assessment: 0-10 Pain Score: 7  Pain Location: right knee worse than hip Pain Descriptors /  Indicators: Aching;Sore Pain Intervention(s): Limited activity within patient's tolerance;Monitored during session;Premedicated before session;Repositioned    Home Living Family/patient expects to be discharged to:: Private residence Living Arrangements: Alone Available Help at Discharge: Personal care attendant;Other (Comment)(hired caregiver to stay) Type of Home: House Home Access: Stairs to enter   [employer] of Steps: 1 Home Layout: One level Home Equipment: Walker - 2 wheels;Shower seat;Bedside commode;Cane - single point      Prior Function Level of Independence: Independent with assistive device(s);Independent         Comments: amb with cane or walker     Hand Dominance        Extremity/Trunk Assessment   Upper Extremity Assessment Upper Extremity Assessment: Overall WFL for tasks assessed    Lower Extremity Assessment Lower Extremity Assessment: RLE deficits/detail RLE Deficits / Details: ankle WFL, AAROM limited by pain RLE: Unable to fully assess due to pain       Communication      Cognition Arousal/Alertness: Awake/alert Behavior During Therapy: WFL for tasks assessed/performed Overall Cognitive Status: Within Functional Limits for tasks assessed  General Comments      Exercises Total Joint Exercises Ankle Circles/Pumps: AROM;Both;10 reps   Assessment/Plan    PT Assessment Patient needs continued PT services  PT Problem List Decreased strength;Decreased activity tolerance;Decreased mobility;Decreased range of motion;Decreased balance;Pain       PT Treatment Interventions DME instruction;Gait training;Therapeutic activities;Therapeutic exercise;Functional mobility training;Patient/family education    PT Goals (Current goals can be found in the Care Plan section)  Acute Rehab PT Goals PT Goal Formulation: With patient Time For Goal Achievement: 08/30/18 Potential to  Achieve Goals: Good    Frequency 7X/week   Barriers to discharge        Co-evaluation               AM-PAC PT "6 Clicks" Mobility  Outcome Measure Help needed turning from your back to your side while in a flat bed without using bedrails?: A Little Help needed moving from lying on your back to sitting on the side of a flat bed without using bedrails?: A Lot Help needed moving to and from a bed to a chair (including a wheelchair)?: A Lot Help needed standing up from a chair using your arms (e.g., wheelchair or bedside chair)?: A Lot Help needed to walk in hospital room?: A Lot Help needed climbing 3-5 steps with a railing? : Total 6 Click Score: 12    End of Session Equipment Utilized During Treatment: Gait belt Activity Tolerance: Patient limited by pain Patient left: in bed;with call bell/phone within reach;with bed alarm set   PT Visit Diagnosis: Difficulty in walking, not elsewhere classified (R26.2)    Time: 2707-8675 PT Time Calculation (min) (ACUTE ONLY): 31 min   Charges:   PT Evaluation $PT Eval Low Complexity: 1 Low PT Treatments $Therapeutic Activity: 8-22 mins        Kenyon Ana, PT  Pager: [phone] Acute Rehab Dept [facility]): [phone]   08/23/2018   [facility] 08/23/2018, 5:07 PM

## 2018-08-23 NOTE — Transfer of Care (Signed)
Immediate Anesthesia Transfer of Care Note  Patient: Olivia Powers  Procedure(s) Performed: TOTAL HIP ARTHROPLASTY ANTERIOR APPROACH (Right )  Patient Location: PACU  Anesthesia Type:Spinal  Level of Consciousness: awake, alert  and oriented  Airway & Oxygen Therapy: Patient Spontanous Breathing and Patient connected to face mask oxygen  Post-op Assessment: Report given to RN and Post -op Vital signs reviewed and stable  Post vital signs: Reviewed and stable  Last Vitals:  Vitals Value Taken Time  BP 110/47 08/23/18 1011  Temp    Pulse    Resp 19 08/23/18 1012  SpO2    Vitals shown include unvalidated device data.  Last Pain:  Vitals:   08/23/18 0703  TempSrc: Oral         Complications: No apparent anesthesia complications

## 2018-08-23 NOTE — Interval H&P Note (Signed)
History and Physical Interval Note:  08/23/2018 6:33 AM  Olivia Powers  has presented today for surgery, with the diagnosis of right hip osteoarthritis.  The various methods of treatment have been discussed with the patient and family. After consideration of risks, benefits and other options for treatment, the patient has consented to  Procedure(s) with comments: [place] (Right) - 165min as a surgical intervention.  The patient's history has been reviewed, patient examined, no change in status, stable for surgery.  I have reviewed the patient's chart and labs.  Questions were answered to the patient's satisfaction.     Pilar Plate Haru Shaff

## 2018-08-23 NOTE — Op Note (Signed)
OPERATIVE REPORT- TOTAL HIP ARTHROPLASTY   PREOPERATIVE DIAGNOSIS: Osteoarthritis of the Right hip.   POSTOPERATIVE DIAGNOSIS: Osteoarthritis of the Right  hip.   PROCEDURE: Right total hip arthroplasty, anterior approach.   SURGEON: Gaynelle Arabian, MD   ASSISTANT: Theresa Duty, PA-C  ANESTHESIA:  Spinal  ESTIMATED BLOOD LOSS:-250 mL    DRAINS: Hemovac x1.   COMPLICATIONS: None   CONDITION: PACU - hemodynamically stable.   BRIEF CLINICAL NOTE: Olivia Powers is a 78 y.o. female who has advanced end-  stage arthritis of their Right  hip with progressively worsening pain and  dysfunction.The patient has failed nonoperative management and presents for  total hip arthroplasty.   PROCEDURE IN DETAIL: After successful administration of spinal  anesthetic, the traction boots for the [facility] bed were placed on both  feet and the patient was placed onto the [facility] bed, boots placed into the leg  holders. The Right hip was then isolated from the perineum with plastic  drapes and prepped and draped in the usual sterile fashion. ASIS and  greater trochanter were marked and a oblique incision was made, starting  at about 1 cm lateral and 2 cm distal to the ASIS and coursing towards  the anterior cortex of the femur. The skin was cut with a 10 blade  through subcutaneous tissue to the level of the fascia overlying the  tensor fascia lata muscle. The fascia was then incised in line with the  incision at the junction of the anterior third and posterior 2/3rd. The  muscle was teased off the fascia and then the interval between the TFL  and the rectus was developed. The Hohmann retractor was then placed at  the top of the femoral neck over the capsule. The vessels overlying the  capsule were cauterized and the fat on top of the capsule was removed.  A Hohmann retractor was then placed anterior underneath the rectus  femoris to give exposure to the entire anterior capsule. A T-shaped   capsulotomy was performed. The edges were tagged and the femoral head  was identified.       Osteophytes are removed off the superior acetabulum.  The femoral neck was then cut in situ with an oscillating saw. Traction  was then applied to the left lower extremity utilizing the [facility]  traction. The femoral head was then removed. Retractors were placed  around the acetabulum and then circumferential removal of the labrum was  performed. Osteophytes were also removed. Reaming starts at 45 mm to  medialize and  Increased in 2 mm increments to 47 mm. We reamed in  approximately 40 degrees of abduction, 20 degrees anteversion. A 48 mm  pinnacle acetabular shell was then impacted in anatomic position under  fluoroscopic guidance with excellent purchase. We did not need to place  any additional dome screws. A 28 mm neutral + 4 marathon liner was then  placed into the acetabular shell.       The femoral lift was then placed along the lateral aspect of the femur  just distal to the vastus ridge. The leg was  externally rotated and capsule  was stripped off the inferior aspect of the femoral neck down to the  level of the lesser trochanter, this was done with electrocautery. The femur was lifted after this was performed. The  leg was then placed in an extended and adducted position essentially delivering the femur. We also removed the capsule superiorly and the piriformis from the piriformis fossa  to gain excellent exposure of the  proximal femur. Rongeur was used to remove some cancellous bone to get  into the lateral portion of the proximal femur for placement of the  initial starter reamer. The starter broaches was placed  the starter broach  and was shown to go down the center of the canal. Broaching  with the Actis system was then performed starting at size 0  coursing  Up to size 3. A size 3 had excellent torsional and rotational  and axial stability. The trial high offset neck was then placed   with a 28 + 1.5 trial head. The hip was then reduced. We confirmed that  the stem was in the canal both on AP and lateral x-rays. It also has excellent sizing. The hip was reduced with outstanding stability through full extension and full external rotation.. AP pelvis was taken and the leg lengths were measured and found to be equal. Hip was then dislocated again and the femoral head and neck removed. The  femoral broach was removed. Size 3 Actis stem with a high offset  neck was then impacted into the femur following native anteversion. Has  excellent purchase in the canal. Excellent torsional and rotational and  axial stability. It is confirmed to be in the canal on AP and lateral  fluoroscopic views. The 28 + 1.5 metal head was placed and the hip  reduced with outstanding stability. Again AP pelvis was taken and it  confirmed that the leg lengths were equal. The wound was then copiously  irrigated with saline solution and the capsule reattached and repaired  with Ethibond suture. 30 ml of .25% Bupivicaine was  injected into the capsule and into the edge of the tensor fascia lata as well as subcutaneous tissue. The fascia overlying the tensor fascia lata was then closed with a running #1 V-Loc. Subcu was closed with interrupted 2-0 Vicryl and subcuticular running 4-0 Monocryl. Incision was cleaned  and dried. Steri-Strips and a bulky sterile dressing applied. Hemovac  drain was hooked to suction and then the patient was awakened and transported to  recovery in stable condition.        Please note that a surgical assistant was a medical necessity for this procedure to perform it in a safe and expeditious manner. Assistant was necessary to provide appropriate retraction of vital neurovascular structures and to prevent femoral fracture and allow for anatomic placement of the prosthesis.  Gaynelle Arabian, M.D.

## 2018-08-23 NOTE — Anesthesia Postprocedure Evaluation (Signed)
Anesthesia Post Note  Patient: Olivia Powers  Procedure(s) Performed: TOTAL HIP ARTHROPLASTY ANTERIOR APPROACH (Right )     Patient location during evaluation: PACU Anesthesia Type: Spinal Level of consciousness: awake and alert, oriented and patient cooperative Pain management: pain level controlled Vital Signs Assessment: post-procedure vital signs reviewed and stable Respiratory status: spontaneous breathing, nonlabored ventilation, respiratory function stable and patient connected to nasal cannula oxygen Cardiovascular status: blood pressure returned to baseline and stable Postop Assessment: no apparent nausea or vomiting, spinal receding and patient able to bend at knees Anesthetic complications: no    Last Vitals:  Vitals:   08/23/18 1115 08/23/18 1130  BP: (!) 100/52 (!) 103/46  Pulse: 84 (!) 54  Resp: 11 10  Temp: (!) 36.3 C   SpO2: 97% 98%    Last Pain:  Vitals:   08/23/18 1115  TempSrc:   PainSc: 3                  Diamante Truszkowski,E. Gari Hartsell

## 2018-08-23 NOTE — Anesthesia Preprocedure Evaluation (Addendum)
Anesthesia Evaluation  Patient identified by MRN, date of birth, ID band Patient awake    Reviewed: Allergy & Precautions, NPO status , Patient's Chart, lab work & pertinent test results  History of Anesthesia Complications Negative for: history of anesthetic complications  Airway Mallampati: II  TM Distance: >3 FB Neck ROM: Full    Dental  (+) Poor Dentition, Missing, Dental Advisory Given, Chipped   Pulmonary neg pulmonary ROS,  08/19/2018 SARS coronavirus neg   breath sounds clear to auscultation       Cardiovascular hypertension, Pt. 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    GI/Hepatic Neg liver ROS, GERD  Medicated and Controlled,  Endo/Other  diabetes (diet controlled, glu 106)Morbid obesity  Renal/GU negative Renal ROS     Musculoskeletal   Abdominal (+) + obese,   Peds  Hematology negative hematology ROS (+)   Anesthesia Other Findings   Reproductive/Obstetrics                           Anesthesia Physical Anesthesia Plan  ASA: III  Anesthesia Plan: Spinal   Post-op Pain Management:    Induction:   PONV Risk Score and Plan: 2 and Ondansetron and Treatment may vary due to age or medical condition  Airway Management Planned: Natural Airway and Simple Face Mask  Additional Equipment:   Intra-op Plan:   Post-operative Plan:   Informed Consent: I have reviewed the patients History and Physical, chart, labs and discussed the procedure including the risks, benefits and alternatives for the proposed anesthesia with the patient or authorized representative who has indicated his/her understanding and acceptance.     Dental advisory given  Plan Discussed with: CRNA and Surgeon  Anesthesia Plan Comments: (Pt had failed SAB in past, she accepts either SAB or GA today)        Anesthesia Quick Evaluation

## 2018-08-24 ENCOUNTER — Encounter (HOSPITAL_COMMUNITY): Payer: Self-pay | Admitting: Orthopedic Surgery

## 2018-08-24 LAB — BASIC METABOLIC PANEL
Anion gap: 9 (ref 5–15)
BUN: 16 mg/dL (ref 8–23)
CO2: 25 mmol/L (ref 22–32)
Calcium: 9 mg/dL (ref 8.9–10.3)
Chloride: 107 mmol/L (ref 98–111)
Creatinine, Ser: 0.94 mg/dL (ref 0.44–1.00)
GFR calc Af Amer: >60 mL/min (ref >60)
GFR calc non Af Amer: 57 mL/min — ABNORMAL LOW (ref >60)
Glucose, Bld: 139 mg/dL — ABNORMAL HIGH (ref 70–99)
Potassium: 4.3 mmol/L (ref 3.5–5.1)
Sodium: 141 mmol/L (ref 135–145)

## 2018-08-24 LAB — CBC
HCT: 37.3 % (ref 36.0–46.0)
Hemoglobin: 11.9 g/dL — ABNORMAL LOW (ref 12.0–15.0)
MCH: 29.2 pg (ref 26.0–34.0)
MCHC: 31.9 g/dL (ref 30.0–36.0)
MCV: 91.4 fL (ref 80.0–100.0)
Platelets: 232 10*3/uL (ref 150–400)
RBC: 4.08 MIL/uL (ref 3.87–5.11)
RDW: 13.3 % (ref 11.5–15.5)
WBC: 13.2 10*3/uL — ABNORMAL HIGH (ref 4.0–10.5)
nRBC: 0 % (ref 0.0–0.2)

## 2018-08-24 NOTE — Progress Notes (Signed)
Physical Therapy Treatment Patient Details Name: Olivia Powers MRN: [id number] DOB: [birth year] Today's Date: 08/24/2018    History of Present Illness Pt is an 78 y/o female s/p R THA-direct anterior on 08/23/18. PMH: L DA THA ~2018, bil TKA, back surgery,  HTN    PT Comments    Pt premedicated for session and still reporting pain with ambulation.   Pt limited by fatigue, pain, unsteadiness.  Pt encouraged to remain upright in recliner (as tolerated).   Follow Up Recommendations  Supervision/Assistance - 24 hour;Home health PT     Equipment Recommendations  None recommended by PT    Recommendations for Other Services       Precautions / Restrictions Precautions Precautions: Fall Restrictions RLE Weight Bearing: Weight bearing as tolerated Other Position/Activity Restrictions: WBAT    Mobility  Bed Mobility Overal bed mobility: Needs Assistance Bed Mobility: Supine to Sit     Supine to sit: Mod assist     General bed mobility comments: assist for R LE and trunk upright  Transfers Overall transfer level: Needs assistance Equipment used: Rolling walker (2 wheeled) Transfers: Sit to/from Stand Sit to Stand: Min assist         General transfer comment: assist for anterior-superior wt shift, cues for hand placement  Ambulation/Gait Ambulation/Gait assistance: Min assist Gait Distance (Feet): 16 Feet Assistive device: Rolling walker (2 wheeled) Gait Pattern/deviations: Step-to pattern;Decreased stance time - right;Antalgic     General Gait Details: verbal cues for sequence, step length, RW positioning, pt tends to have LOB posteriorly, provided increased cues for positioning within RW to assist with balance, distance limited by pain   Stairs             Wheelchair Mobility    Modified Rankin (Stroke Patients Only)       Balance                                            Cognition Arousal/Alertness: Awake/alert Behavior During  Therapy: WFL for tasks assessed/performed Overall Cognitive Status: Within Functional Limits for tasks assessed                                        Exercises      General Comments        Pertinent Vitals/Pain Pain Assessment: 0-10 Pain Score: 3  Pain Location: R hip Pain Descriptors / Indicators: Aching;Sore Pain Intervention(s): Monitored during session;Premedicated before session;Repositioned    Home Living                      Prior Function            PT Goals (current goals can now be found in the care plan section) Progress towards PT goals: Progressing toward goals    Frequency    7X/week      PT Plan Current plan remains appropriate    Co-evaluation              AM-PAC PT "6 Clicks" Mobility   Outcome Measure  Help needed turning from your back to your side while in a flat bed without using bedrails?: A Little Help needed moving from lying on your back to sitting on the side of a flat bed without using bedrails?: A Lot Help  needed moving to and from a bed to a chair (including a wheelchair)?: A Lot Help needed standing up from a chair using your arms (e.g., wheelchair or bedside chair)?: A Little Help needed to walk in hospital room?: A Little Help needed climbing 3-5 steps with a railing? : A Lot 6 Click Score: 15    End of Session Equipment Utilized During Treatment: Gait belt Activity Tolerance: Patient limited by pain Patient left: in chair;with chair alarm set;with call bell/phone within reach Nurse Communication: Mobility status PT Visit Diagnosis: Other abnormalities of gait and mobility (R26.89)     Time: 8889-1694 PT Time Calculation (min) (ACUTE ONLY): 14 min  Charges:  $Gait Training: 8-22 mins                    Carmelia Bake, PT, DPT Acute Rehabilitation Services Office: [phone] Pager: [phone]  Trena Platt 08/24/2018, 3:48 PM

## 2018-08-24 NOTE — Progress Notes (Signed)
Physical Therapy Treatment Patient Details Name: Olivia Powers MRN: [id number] DOB: 1940/09/04 Today's Date: 08/24/2018    History of Present Illness Pt is an 78 y/o female s/p R THA-direct anterior on 08/23/18. PMH: L DA THA ~2018, bil TKA, back surgery,  HTN    PT Comments    Pt assisted with ambulating again in hallway and tolerated improved however still short distance.  Pt with occasional posterior LOB requiring assist.  Pt reports she has family and friends providing 24/7 care upon d/c for several days.  Pt has one step into home and plans to likely d/c home tomorrow.   Follow Up Recommendations  Supervision/Assistance - 24 hour;Home health PT     Equipment Recommendations  None recommended by PT    Recommendations for Other Services       Precautions / Restrictions Precautions Precautions: Fall Restrictions RLE Weight Bearing: Weight bearing as tolerated Other Position/Activity Restrictions: WBAT    Mobility  Bed Mobility Overal bed mobility: Needs Assistance Bed Mobility: Supine to Sit;Sit to Supine     Supine to sit: Min assist Sit to supine: Min assist   General bed mobility comments: assist for R LE  Transfers Overall transfer level: Needs assistance Equipment used: Rolling walker (2 wheeled) Transfers: Sit to/from Stand Sit to Stand: Min assist         General transfer comment: assist for anterior-superior wt shift, cues for hand placement  Ambulation/Gait Ambulation/Gait assistance: Min assist Gait Distance (Feet): 40 Feet Assistive device: Rolling walker (2 wheeled) Gait Pattern/deviations: Step-to pattern;Decreased stance time - right;Antalgic     General Gait Details: verbal cues for sequence, step length, RW positioning, pt tends to have LOB posteriorly, provided increased cues for positioning within RW to assist with balance, slow pace however able to progress distance a little this afternoon   Stairs             Wheelchair  Mobility    Modified Rankin (Stroke Patients Only)       Balance                                            Cognition Arousal/Alertness: Awake/alert Behavior During Therapy: WFL for tasks assessed/performed Overall Cognitive Status: Within Functional Limits for tasks assessed                                        Exercises Total Joint Exercises Ankle Circles/Pumps: AROM;Both;10 reps;Supine Quad Sets: AROM;Strengthening;10 reps;Supine;Both Hip ABduction/ADduction: AAROM;Right;10 reps;Supine    General Comments        Pertinent Vitals/Pain Pain Assessment: 0-10 Pain Score: 3  Pain Location: R hip Pain Descriptors / Indicators: Aching;Sore Pain Intervention(s): Monitored during session;Premedicated before session;Repositioned    Home Living                      Prior Function            PT Goals (current goals can now be found in the care plan section) Progress towards PT goals: Progressing toward goals    Frequency    7X/week      PT Plan Current plan remains appropriate    Co-evaluation              AM-PAC PT "6 Clicks" Mobility  Outcome Measure  Help needed turning from your back to your side while in a flat bed without using bedrails?: A Little Help needed moving from lying on your back to sitting on the side of a flat bed without using bedrails?: A Little Help needed moving to and from a bed to a chair (including a wheelchair)?: A Little Help needed standing up from a chair using your arms (e.g., wheelchair or bedside chair)?: A Little Help needed to walk in hospital room?: A Little Help needed climbing 3-5 steps with a railing? : A Lot 6 Click Score: 17    End of Session Equipment Utilized During Treatment: Gait belt Activity Tolerance: Patient tolerated treatment well Patient left: in bed;with call bell/phone within reach Nurse Communication: Mobility status PT Visit Diagnosis: Other  abnormalities of gait and mobility (R26.89)     Time: 9191-6606 PT Time Calculation (min) (ACUTE ONLY): 18 min  Charges:  $Gait Training: 8-22 mins                     Carmelia Bake, PT, DPT Acute Rehabilitation Services Office: [phone] Pager: [place] E 08/24/2018, 3:55 PM

## 2018-08-24 NOTE — Progress Notes (Signed)
   Subjective: 1 Day Post-Op Procedure(s) (LRB): TOTAL HIP ARTHROPLASTY ANTERIOR APPROACH (Right) Patient reports pain as mild.   Patient seen in rounds with Dr. Wynelle Link. Patient is well, but has had some minor complaints of poor balance. She has some trouble with poor equilibrium due to previous brain tumor. Otherwise feels that she is doing well. No SOB and chest pain.  Plan is to go Home after hospital stay.  Objective: Vital signs in last 24 hours: Temp:  [97.4 F (36.3 C)-98 F (36.7 C)] 97.9 F (36.6 C) (07/23 0612) Pulse Rate:  [46-105] 62 (07/23 0612) Resp:  [10-19] 14 (07/23 0612) BP: (93-145)/(43-70) 132/57 (07/23 0612) SpO2:  [91 %-100 %] 92 % (07/23 0612) Weight:  [77.6 kg] 77.6 kg (07/22 1440)  Intake/Output from previous day:  Intake/Output Summary (Last 24 hours) at 08/24/2018 0749 Last data filed at 08/24/2018 0700 Gross per 24 hour  Intake 2747.48 ml  Output 2400 ml  Net 347.48 ml     Labs: Recent Labs    08/21/18 1115 08/24/18 0325  HGB 14.0 11.9*   Recent Labs    08/21/18 1115 08/24/18 0325  WBC 7.3 13.2*  RBC 4.83 4.08  HCT 44.1 37.3  PLT 272 232   Recent Labs    08/21/18 1115 08/24/18 0325  NA 141 141  K 4.5 4.3  CL 106 107  CO2 27 25  BUN 17 16  CREATININE 0.95 0.94  GLUCOSE 102* 139*  CALCIUM 9.8 9.0   Recent Labs    08/21/18 1115  INR 1.0    EXAM General - Patient is Alert and Oriented Extremity - Neurologically intact Intact pulses distally Dorsiflexion/Plantar flexion intact No cellulitis present Compartment soft Dressing - dressing C/D/I Motor Function - intact, moving foot and toes well on exam.  Hemovac pulled without difficulty.  Past Medical History:  Diagnosis Date  . Alopecia   . Arthritis   . Cancer ([place])    skin cancer of chest melanoma  . Diabetes mellitus without complication ([place])    taken off Metformin by PCP this year.  . Gout   . 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 Hypercholesteremia   . Hypertension     Assessment/Plan: 1 Day Post-Op Procedure(s) (LRB): TOTAL HIP ARTHROPLASTY ANTERIOR APPROACH (Right) Active Problems:   OA (osteoarthritis) of hip  Estimated body mass index is 34.56 kg/m as calculated from the following:   Height as of this encounter: 4\' 11"  (1.499 m).   Weight as of this encounter: 77.6 kg. Advance diet Up with therapy D/C IV fluids  DVT Prophylaxis - Aspirin Weight Bearing As Tolerated   Patient has some issues with equilibrium so we are going to keep her today to do extra PT. Hopeful for DC tomorrow.   Ardeen Jourdain, PA-C Orthopaedic Surgery 08/24/2018, 7:49 AM

## 2018-08-24 NOTE — Progress Notes (Signed)
Physical Therapy Progress Note  Clinical Impression: Pt seen for LE strengthening therex. PT provided HEP handout and reviewed with pt in full. Pt with increasing pain with movement and exercises, requiring some assistance from therapist. PT also provided pt education re: ice, generalized walking program, progression of HEP and car transfers with demonstration. Pt would continue to benefit from skilled physical therapy services at this time while admitted and after d/c to address the below listed limitations in order to improve overall safety and independence with functional mobility.  Sherie Don, [place], DPT  Acute Rehabilitation Services Pager [phone] Office [phone]    08/24/18 0900  PT Visit Information  Last PT Received On 08/24/18  Assistance Needed +1  History of Present Illness Pt is an 78 y/o female s/p R THA-direct anterior on 08/23/18. PMH: L DA THA ~2018, bil TKA, back surgery,  HTN  Precautions  Precautions Fall  Restrictions  Weight Bearing Restrictions Yes  RLE Weight Bearing WBAT  Pain Assessment  Pain Assessment Faces  Faces Pain Scale 6  Pain Location R hip  Pain Descriptors / Indicators Aching;Sore  Pain Intervention(s) Monitored during session;Repositioned  Cognition  Arousal/Alertness Awake/alert  Behavior During Therapy WFL for tasks assessed/performed  Overall Cognitive Status Within Functional Limits for tasks assessed  Exercises  Exercises Total Joint  Total Joint Exercises  Ankle Circles/Pumps AROM;Both;10 reps;Supine  Quad Sets AROM;Strengthening;Right;10 reps;Supine  Short Arc Quad AAROM;Right;10 reps;Supine  Heel Slides AAROM;Right;10 reps;Supine  Hip ABduction/ADduction AAROM;Right;10 reps;Supine  PT - End of Session  Activity Tolerance Patient limited by pain  Patient left in bed;with call bell/phone within reach;with bed alarm set  Nurse Communication Mobility status   PT - Assessment/Plan  PT Plan Current plan remains appropriate   PT Visit Diagnosis Other abnormalities of gait and mobility (R26.89)  PT Frequency (ACUTE ONLY) 7X/week  Follow Up Recommendations Supervision/Assistance - 24 hour;Home health PT  PT equipment None recommended by PT  AM-PAC PT "6 Clicks" Mobility Outcome Measure (Version 2)  Help needed turning from your back to your side while in a flat bed without using bedrails? 3  Help needed moving from lying on your back to sitting on the side of a flat bed without using bedrails? 2  Help needed moving to and from a bed to a chair (including a wheelchair)? 2  Help needed standing up from a chair using your arms (e.g., wheelchair or bedside chair)? 2  Help needed to walk in hospital room? 2  Help needed climbing 3-5 steps with a railing?  1  6 Click Score 12  Consider Recommendation of Discharge To: CIR/SNF/LTACH  PT Goal Progression  Progress towards PT goals Progressing toward goals  Acute Rehab PT Goals  PT Goal Formulation With patient  Time For Goal Achievement 08/30/18  Potential to Achieve Goals Good  PT Time Calculation  PT Start Time (ACUTE ONLY) 6283  PT Stop Time (ACUTE ONLY) 0837  PT Time Calculation (min) (ACUTE ONLY) 13 min  PT General Charges  $$ ACUTE PT VISIT 1 Visit  PT Treatments  $Therapeutic Exercise 8-22 mins

## 2018-08-25 LAB — BASIC METABOLIC PANEL
Anion gap: 10 (ref 5–15)
BUN: 18 mg/dL (ref 8–23)
CO2: 23 mmol/L (ref 22–32)
Calcium: 9 mg/dL (ref 8.9–10.3)
Chloride: 107 mmol/L (ref 98–111)
Creatinine, Ser: 0.94 mg/dL (ref 0.44–1.00)
GFR calc Af Amer: >60 mL/min (ref >60)
GFR calc non Af Amer: 57 mL/min — ABNORMAL LOW (ref >60)
Glucose, Bld: 123 mg/dL — ABNORMAL HIGH (ref 70–99)
Potassium: 4 mmol/L (ref 3.5–5.1)
Sodium: 140 mmol/L (ref 135–145)

## 2018-08-25 LAB — CBC
HCT: 36.9 % (ref 36.0–46.0)
Hemoglobin: 11.8 g/dL — ABNORMAL LOW (ref 12.0–15.0)
MCH: 28.7 pg (ref 26.0–34.0)
MCHC: 32 g/dL (ref 30.0–36.0)
MCV: 89.8 fL (ref 80.0–100.0)
Platelets: 217 10*3/uL (ref 150–400)
RBC: 4.11 MIL/uL (ref 3.87–5.11)
RDW: 13.5 % (ref 11.5–15.5)
WBC: 12.9 10*3/uL — ABNORMAL HIGH (ref 4.0–10.5)
nRBC: 0 % (ref 0.0–0.2)

## 2018-08-25 MED ORDER — METHOCARBAMOL 500 MG PO TABS: 500.0000 mg | ORAL_TABLET | Freq: Four times a day (QID) | ORAL | 0 refills | Status: DC | PRN

## 2018-08-25 MED ORDER — ASPIRIN 325 MG PO TBEC: 325.0000 mg | DELAYED_RELEASE_TABLET | Freq: Two times a day (BID) | ORAL | 0 refills | Status: AC

## 2018-08-25 MED ORDER — HYDROCODONE-ACETAMINOPHEN 5-325 MG PO TABS: 1.0000 | ORAL_TABLET | Freq: Four times a day (QID) | ORAL | 0 refills | Status: DC | PRN

## 2018-08-25 NOTE — Progress Notes (Signed)
Physical Therapy Treatment Patient Details Name: Olivia Powers MRN: [id number] DOB: [birth year] Today's Date: 08/25/2018    History of Present Illness Pt is an 78 y/o female s/p R THA-direct anterior on 08/23/18. PMH: L DA THA ~2018, bil TKA, back surgery,  HTN    PT Comments    Pt ambulated in hallway and practiced one step.  Pt with improved mobility and balance today.  Pt also performed LE exercises and provided with HEP handout.  Pt feels ready for d/c home today.  Follow Up Recommendations  Supervision/Assistance - 24 hour;Home health PT     Equipment Recommendations  None recommended by PT    Recommendations for Other Services       Precautions / Restrictions Precautions Precautions: Fall Restrictions Other Position/Activity Restrictions: WBAT    Mobility  Bed Mobility Overal bed mobility: Needs Assistance Bed Mobility: Supine to Sit     Supine to sit: Min guard     General bed mobility comments: pt self assisted R LE  Transfers Overall transfer level: Needs assistance Equipment used: Rolling walker (2 wheeled) Transfers: Sit to/from Stand Sit to Stand: Min guard         General transfer comment: verbal cues for UE and LE positioning. min/guard for safety  Ambulation/Gait Ambulation/Gait assistance: Min guard Gait Distance (Feet): 120 Feet Assistive device: Rolling walker (2 wheeled) Gait Pattern/deviations: Step-to pattern;Decreased stance time - right;Antalgic     General Gait Details: verbal cues for sequence, step length, RW positioning, pt with improved balance today   Stairs Stairs: Yes Stairs assistance: Min guard Stair Management: Step to pattern;Backwards;With walker Number of Stairs: 1 General stair comments: verbal cues for safe technique; pt reports understanding   Wheelchair Mobility    Modified Rankin (Stroke Patients Only)       Balance                                            Cognition  Arousal/Alertness: Awake/alert Behavior During Therapy: WFL for tasks assessed/performed Overall Cognitive Status: Within Functional Limits for tasks assessed                                        Exercises Total Joint Exercises Ankle Circles/Pumps: AROM;Both;10 reps;Supine Quad Sets: AROM;Strengthening;10 reps;Supine;Both Heel Slides: AAROM;Right;10 reps;Supine Hip ABduction/ADduction: AAROM;Right;10 reps;Supine Long Arc Quad: AROM;Right;10 reps;Seated Other Exercises Other Exercises: Pt provided with HEP handout and aware to only perform standing exercises with another person min/guard for safety due to poor balance.  She is agreeable    General Comments        Pertinent Vitals/Pain Pain Assessment: 0-10 Pain Score: 2  Pain Location: R hip Pain Descriptors / Indicators: Aching;Sore Pain Intervention(s): Monitored during session;Premedicated before session;Repositioned    Home Living                      Prior Function            PT Goals (current goals can now be found in the care plan section) Progress towards PT goals: Progressing toward goals    Frequency    7X/week      PT Plan Current plan remains appropriate    Co-evaluation              AM-PAC  PT "6 Clicks" Mobility   Outcome Measure  Help needed turning from your back to your side while in a flat bed without using bedrails?: A Little Help needed moving from lying on your back to sitting on the side of a flat bed without using bedrails?: A Little Help needed moving to and from a bed to a chair (including a wheelchair)?: A Little Help needed standing up from a chair using your arms (e.g., wheelchair or bedside chair)?: A Little Help needed to walk in hospital room?: A Little Help needed climbing 3-5 steps with a railing? : A Little 6 Click Score: 18    End of Session Equipment Utilized During Treatment: Gait belt Activity Tolerance: Patient tolerated treatment  well Patient left: with call bell/phone within reach;in chair   PT Visit Diagnosis: Other abnormalities of gait and mobility (R26.89)     Time: 8811-0315 PT Time Calculation (min) (ACUTE ONLY): 20 min  Charges:  $Gait Training: 8-22 mins                     Carmelia Bake, PT, DPT Acute Rehabilitation Services Office: [phone] Pager: [phone]   Trena Platt 08/25/2018, 3:45 PM

## 2018-08-25 NOTE — Progress Notes (Signed)
   Subjective: 2 Days Post-Op Procedure(s) (LRB): TOTAL HIP ARTHROPLASTY ANTERIOR APPROACH (Right) Patient reports pain as mild.   Patient seen in rounds with Dr. Wynelle Link. Patient is well, and has had no acute complaints or problems. States her pain is much improved from preoperatively and she is ready to go home and begin her exercises. No issues overnight. Denies chest pain or SOB. Plan is to go Home after hospital stay.  Objective: Vital signs in last 24 hours: Temp:  [97.5 F (36.4 C)-98.5 F (36.9 C)] 97.6 F (36.4 C) (07/24 0513) Pulse Rate:  [51-74] 51 (07/24 0513) Resp:  [16] 16 (07/24 0513) BP: (112-130)/(45-63) 127/63 (07/24 0513) SpO2:  [96 %-99 %] 99 % (07/24 0513)  Intake/Output from previous day:  Intake/Output Summary (Last 24 hours) at 08/25/2018 0724 Last data filed at 08/25/2018 0513 Gross per 24 hour  Intake 816.08 ml  Output 300 ml  Net 516.08 ml   Labs: Recent Labs    08/24/18 0325 08/25/18 0300  HGB 11.9* 11.8*   Recent Labs    08/24/18 0325 08/25/18 0300  WBC 13.2* 12.9*  RBC 4.08 4.11  HCT 37.3 36.9  PLT 232 217   Recent Labs    08/24/18 0325 08/25/18 0300  NA 141 140  K 4.3 4.0  CL 107 107  CO2 25 23  BUN 16 18  CREATININE 0.94 0.94  GLUCOSE 139* 123*  CALCIUM 9.0 9.0   Exam: General - Patient is Alert and Oriented Extremity - Neurologically intact Neurovascular intact Sensation intact distally Dorsiflexion/Plantar flexion intact Dressing/Incision - clean, dry, no drainage Motor Function - intact, moving foot and toes well on exam.   Past Medical History:  Diagnosis Date  . Alopecia   . Arthritis   . Cancer ([place])    skin cancer of chest melanoma  . Diabetes mellitus without complication ([place])    taken off Metformin by PCP this year.  . Gout   . 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 Hypercholesteremia   . Hypertension     Assessment/Plan: 2 Days Post-Op Procedure(s) (LRB):  TOTAL HIP ARTHROPLASTY ANTERIOR APPROACH (Right) Active Problems:   OA (osteoarthritis) of hip  Estimated body mass index is 34.56 kg/m as calculated from the following:   Height as of this encounter: 4\' 11"  (1.499 m).   Weight as of this encounter: 77.6 kg. Up with therapy D/C IV fluids  DVT Prophylaxis - Aspirin Weight-bearing as tolerated  Plan for discharge to home today once meeting goals with HEP. Follow-up in the office in 2 weeks.   Theresa Duty, PA-C Orthopedic Surgery 08/25/2018, 7:24 AM

## 2018-08-25 NOTE — Care Management Important Message (Signed)
Important Message  Patient Details IM Letter given to Velva Harman RN to present to the Patient Name: Olivia Powers MRN: [id number] Date of Birth: 1940/08/09   Medicare Important Message Given:  Yes     Kerin Salen 08/25/2018, 10:31 AM

## 2018-08-25 NOTE — TOC Transition Note (Signed)
Transition of Care [facility]) - CM/SW Discharge Note   Patient Details  Name: Nataliah Hatlestad MRN: [id number] Date of Birth: May 09, 1940  [facility]) CM/SW Contact:  Leeroy Cha, RN Phone Number: 08/25/2018, 10:29 AM   Clinical Narrative:    dcd to home   Final next level of care: Home/Self Care Barriers to Discharge: No Barriers Identified   Patient Goals and CMS Choice Patient states their goals for this hospitalization and ongoing recovery are:: none stated CMS Medicare.gov Compare Post Acute Care list provided to:: Patient    Discharge Placement                       Discharge Plan and Services   Discharge Planning Services: CM Consult                                 Social Determinants of Health (SDOH) Interventions     Readmission Risk Interventions No flowsheet data found.

## 2018-08-28 NOTE — Discharge Summary (Signed)
Physician Discharge Summary   Patient ID: Olivia Powers MRN: [id number] DOB/AGE: [birth year] 80 y.o.  Admit date: 08/23/2018 Discharge date: 08/25/2018  Primary Diagnosis: Osteoarthritis, right hip   Admission Diagnoses:  Past Medical History:  Diagnosis Date   Alopecia    Arthritis    Cancer ([place])    skin cancer of chest melanoma   Diabetes mellitus without complication ([place])    taken off Metformin by PCP this year.   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   Weight as of this encounter: 77.6 kg.  Procedure:  Procedure(s) (LRB): TOTAL HIP ARTHROPLASTY ANTERIOR APPROACH (Right)   Consults: None  HPI: Olivia Powers is a 77 y.o. female who has advanced end-stage arthritis of their Right  hip with progressively worsening pain and dysfunction.The patient has failed nonoperative management and presents for total hip arthroplasty.   Laboratory Data: Admission on 08/23/2018, Discharged on 08/25/2018  Component Date Value Ref Range Status   Glucose-Capillary 08/23/2018 106* 70 - 99 mg/dL Final   Comment 1 08/23/2018 Notify RN   Final   Comment 2 08/23/2018 Document in Chart   Final   Glucose-Capillary 08/23/2018 134* 70 - 99 mg/dL Final   WBC 08/24/2018 13.2* 4.0 - 10.5 K/uL Final   RBC 08/24/2018 4.08  3.87 - 5.11 MIL/uL Final   Hemoglobin 08/24/2018 11.9* 12.0 - 15.0 g/dL Final   HCT 08/24/2018 37.3  36.0 - 46.0 % Final   MCV 08/24/2018 91.4  80.0 - 100.0 fL Final   MCH 08/24/2018 29.2  26.0 - 34.0 pg Final   MCHC 08/24/2018 31.9  30.0 - 36.0 g/dL Final   RDW 08/24/2018 13.3  11.5 - 15.5 % Final   Platelets 08/24/2018 232  150 - 400 K/uL Final   nRBC 08/24/2018 0.0  0.0 - 0.2 % Final    Performed at [facility], [place] [address] [place], [place] [address]   Sodium 08/24/2018 141  135 - 145 mmol/L Final   Potassium 08/24/2018 4.3  3.5 - 5.1 mmol/L Final   Chloride 08/24/2018 107  98 - 111 mmol/L Final   CO2 08/24/2018 25  22 - 32 mmol/L Final   Glucose, Bld 08/24/2018 139* 70 - 99 mg/dL Final   BUN 08/24/2018 16  8 - 23 mg/dL Final   Creatinine, Ser 08/24/2018 0.94  0.44 - 1.00 mg/dL Final   Calcium 08/24/2018 9.0  8.9 - 10.3 mg/dL Final   GFR calc non Af Amer 08/24/2018 57* >60 mL/min Final   GFR calc Af Amer 08/24/2018 >60  >60 mL/min Final   Anion gap 08/24/2018 9  5 - 15 Final   Performed at [facility], [place] [address] [place], [place] [address]   WBC 08/25/2018 12.9* 4.0 - 10.5 K/uL Final   RBC 08/25/2018 4.11  3.87 - 5.11 MIL/uL Final   Hemoglobin 08/25/2018 11.8* 12.0 - 15.0 g/dL Final   HCT 08/25/2018 36.9  36.0 - 46.0 % Final   MCV 08/25/2018 89.8  80.0 - 100.0 fL Final   MCH 08/25/2018 28.7  26.0 - 34.0 pg Final   MCHC 08/25/2018 32.0  30.0 - 36.0 g/dL Final  RDW 08/25/2018 13.5  11.5 - 15.5 % Final   Platelets 08/25/2018 217  150 - 400 K/uL Final   nRBC 08/25/2018 0.0  0.0 - 0.2 % Final   Performed at [facility], [place] [address], [place], [place] [address]   Sodium 08/25/2018 140  135 - 145 mmol/L Final   Potassium 08/25/2018 4.0  3.5 - 5.1 mmol/L Final   Chloride 08/25/2018 107  98 - 111 mmol/L Final   CO2 08/25/2018 23  22 - 32 mmol/L Final   Glucose, Bld 08/25/2018 123* 70 - 99 mg/dL Final   BUN 08/25/2018 18  8 - 23 mg/dL Final   Creatinine, Ser 08/25/2018 0.94  0.44 - 1.00 mg/dL Final   Calcium 08/25/2018 9.0  8.9 - 10.3 mg/dL Final   GFR calc non Af Amer 08/25/2018 57* >60 mL/min Final   GFR calc Af Amer 08/25/2018 >60  >60 mL/min Final   Anion gap 08/25/2018 10  5 - 15 Final   Performed at [facility], [place] [address],  [place], [place] [address]  Hospital Outpatient Visit on 08/21/2018  Component Date Value Ref Range Status   aPTT 08/21/2018 27  24 - 36 seconds Final   Performed at [facility], [place] [address] [place], [place] [address]   WBC 08/21/2018 7.3  4.0 - 10.5 K/uL Final   RBC 08/21/2018 4.83  3.87 - 5.11 MIL/uL Final   Hemoglobin 08/21/2018 14.0  12.0 - 15.0 g/dL Final   HCT 08/21/2018 44.1  36.0 - 46.0 % Final   MCV 08/21/2018 91.3  80.0 - 100.0 fL Final   MCH 08/21/2018 29.0  26.0 - 34.0 pg Final   MCHC 08/21/2018 31.7  30.0 - 36.0 g/dL Final   RDW 08/21/2018 13.4  11.5 - 15.5 % Final   Platelets 08/21/2018 272  150 - 400 K/uL Final   nRBC 08/21/2018 0.0  0.0 - 0.2 % Final   Performed at [facility], [place] [address] [place], [place] [address]   Sodium 08/21/2018 141  135 - 145 mmol/L Final   Potassium 08/21/2018 4.5  3.5 - 5.1 mmol/L Final   Chloride 08/21/2018 106  98 - 111 mmol/L Final   CO2 08/21/2018 27  22 - 32 mmol/L Final   Glucose, Bld 08/21/2018 102* 70 - 99 mg/dL Final   BUN 08/21/2018 17  8 - 23 mg/dL Final   Creatinine, Ser 08/21/2018 0.95  0.44 - 1.00 mg/dL Final   Calcium 08/21/2018 9.8  8.9 - 10.3 mg/dL Final   Total Protein 08/21/2018 7.4  6.5 - 8.1 g/dL Final   Albumin 08/21/2018 4.3  3.5 - 5.0 g/dL Final   AST 08/21/2018 15  15 - 41 U/L Final   ALT 08/21/2018 11  0 - 44 U/L Final   Alkaline Phosphatase 08/21/2018 76  38 - 126 U/L Final   Total Bilirubin 08/21/2018 1.0  0.3 - 1.2 mg/dL Final   GFR calc non Af Amer 08/21/2018 57* >60 mL/min Final   GFR calc Af Amer 08/21/2018 >60  >60 mL/min Final   Anion gap 08/21/2018 8  5 - 15 Final   Performed at [facility], [place] [address] [place], [place] [address]   Prothrombin Time 08/21/2018 12.6  11.4 - 15.2 seconds Final   INR 08/21/2018 1.0  0.8 - 1.2 Final   Comment: (NOTE) INR goal varies based on device and disease states. Performed  at [facility],  [place] [address] [place], [place] [address]    ABO/RH(D) 08/21/2018 A POS   Final   Antibody Screen 08/21/2018 NEG   Final   Sample Expiration 08/21/2018 08/26/2018,2359   Final   Extend sample reason 08/21/2018    Final                   Value:NO TRANSFUSIONS OR PREGNANCY IN THE PAST 3 MONTHS Performed at [place] [address] [place], [place] [address]    Hgb A1c MFr Bld 08/21/2018 5.9* 4.8 - 5.6 % Final   Comment: (NOTE) Pre diabetes:          5.7%-6.4% Diabetes:              >6.4% Glycemic control for   <7.0% adults with diabetes    Mean Plasma Glucose 08/21/2018 122.63  mg/dL Final   Performed at [facility], [place] [address] [place], [place] [address]   MRSA, PCR 08/21/2018 NEGATIVE  NEGATIVE Final   Staphylococcus aureus 08/21/2018 NEGATIVE  NEGATIVE Final   Comment: (NOTE) The Xpert SA Assay (FDA approved for NASAL specimens in patients 27 years of age and older), is one component of a comprehensive surveillance program. It is not intended to diagnose infection nor to guide or monitor treatment. Performed at [facility], [place] [address], [place], [place] [address]    Glucose-Capillary 08/21/2018 83  70 - 99 mg/dL Final  Hospital Outpatient Visit on 08/19/2018  Component Date Value Ref Range Status   SARS Coronavirus 2 08/19/2018 NEGATIVE  NEGATIVE Final   Comment: (NOTE) SARS-CoV-2 target nucleic acids are NOT DETECTED. The SARS-CoV-2 RNA is generally detectable in upper and lower respiratory specimens during the acute phase of infection. Negative results do not preclude SARS-CoV-2 infection, do not rule out co-infections with other pathogens, and should not be used as the sole basis for treatment or other patient management decisions. Negative results must be combined with clinical observations, patient history, and epidemiological information. The expected result is  Negative. Fact Sheet for Patients: SugarRoll.be Fact Sheet for Healthcare Providers: https://www.woods-mathews.com/ This test is not yet approved or cleared by the [place] FDA and  has been authorized for detection and/or diagnosis of SARS-CoV-2 by FDA under an Emergency Use Authorization (EUA). This EUA will remain  in effect (meaning this test can be used) for the duration of the COVID-19 declaration under Section 56                          4(b)(1) of the Act, 21 U.S.C. section 360bbb-3(b)(1), unless the authorization is terminated or revoked sooner. Performed at [facility], [place] [address], [place], [place] [address]      X-Rays:Dg Pelvis Portable  Result Date: 08/23/2018 CLINICAL DATA:  Postop right total hip arthroplasty. EXAM: PORTABLE PELVIS 1-2 VIEWS COMPARISON:  07/06/2017 FINDINGS: Interval right total hip arthroplasty or prosthesis normally located and intact. Surgical drain over the soft tissues of the right hip. Left hip arthroplasty unchanged. Remainder of the exam is unchanged. IMPRESSION: Expected changes post total right hip arthroplasty. Electronically Signed   By: Marin Olp M.D.   On: 08/23/2018 13:35   Dg C-arm 1-60 Min-no Report  Result Date: 08/23/2018 Fluoroscopy was utilized by the requesting physician.  No radiographic interpretation.   Dg Hip Operative Unilat W Or W/o Pelvis Right  Result Date: 08/23/2018 CLINICAL DATA:  Right total hip replacement EXAM: OPERATIVE  RIGHT HIP (WITH PELVIS IF PERFORMED) 1 VIEWS TECHNIQUE: Fluoroscopic spot image(s) were submitted for interpretation post-operatively. COMPARISON:  MRI 02/24/2018, plain film 07/06/2017 FINDINGS: Changes of right hip replacement. Normal AP alignment. No visible complicating feature. Remote changes of left hip replacement. IMPRESSION: Right hip replacement.  No visible complicating feature. Electronically Signed   By: Rolm Baptise M.D.   On:  08/23/2018 13:11    EKG: Orders placed or performed during the hospital encounter of 08/21/18   EKG 12 lead   EKG 12 lead     Hospital Course: Corleen Otwell is a 77 y.o. who was admitted to [facility]. They were brought to the operating room on 08/23/2018 and underwent Procedure(s): [place].  Patient tolerated the procedure well and was later transferred to the recovery room and then to the orthopaedic floor for postoperative care. They were given PO and IV analgesics for pain control following their surgery. They were given 24 hours of postoperative antibiotics of  Anti-infectives (From admission, onward)   Start     Dose/Rate Route Frequency Ordered Stop   08/23/18 1500  ceFAZolin (ANCEF) IVPB 2g/100 mL premix     2 g 200 mL/hr over 30 Minutes Intravenous Every 6 hours 08/23/18 1154 08/23/18 2049   08/23/18 0645  ceFAZolin (ANCEF) IVPB 2g/100 mL premix     2 g 200 mL/hr over 30 Minutes Intravenous On call to O.R. 08/23/18 [id number] 08/23/18 0836     and started on DVT prophylaxis in the form of Aspirin.   PT and OT were ordered for total joint protocol. Discharge planning consulted to help with postop disposition and equipment needs. Patient had a decent night on the evening of surgery. They started to get up OOB with therapy on POD #0. Hemovac drain was pulled without difficulty on day one. Pt has chronic issues with equilibrium due to history of brain tumor, decided to stay an additional night to benefit from continued therapy sessions. Pt was seen during rounds on day two and was ready to go home pending progress with therapy. Pain was much improved. Dressing was changed and the incision was clean, dry, and intact with no drainage. Pt worked with therapy for one additional session and was meeting their goals. She was discharged to home later that day in stable condition.  Diet: Diabetic diet Activity: WBAT Follow-up: in 2 weeks Disposition: Home  with HEP Discharged Condition: stable   Discharge Instructions    Call MD / Call 911   Complete by: As directed    If you experience chest pain or shortness of breath, CALL 911 and be transported to the hospital emergency room.  If you develope a fever above 101 F, pus (white drainage) or increased drainage or redness at the wound, or calf pain, call your surgeon's office.   Change dressing   Complete by: As directed    You may change the dressing daily with sterile 4 x 4 inch gauze dressing and paper tape.   Constipation Prevention   Complete by: As directed    Drink plenty of fluids.  Prune juice may be helpful.  You may use a stool softener, such as Colace (over the counter) 100 mg twice a day.  Use MiraLax (over the counter) for constipation as needed.   Diet - low sodium heart healthy   Complete by: As directed    Discharge instructions   Complete by: As directed    ? Dr. Pilar Plate Aluisio Total Joint  Specialist [facility] [address], [place], [place] [address] [phone]  ANTERIOR APPROACH TOTAL HIP REPLACEMENT POSTOPERATIVE DIRECTIONS   Hip Rehabilitation, Guidelines Following Surgery  The results of a hip operation are greatly improved after range of motion and muscle strengthening exercises. Follow all safety measures which are given to protect your hip. If any of these exercises cause increased pain or swelling in your joint, decrease the amount until you are comfortable again. Then slowly increase the exercises. Call your caregiver if you have problems or questions.   HOME CARE INSTRUCTIONS  Remove items at home which could result in a fall. This includes throw rugs or furniture in walking pathways.  ICE to the affected hip every three hours for 30 minutes at a time and then as needed for pain and swelling.  Continue to use ice on the hip for pain and swelling from surgery. You may notice swelling that will progress down to the foot and ankle.  This is  normal after surgery.  Elevate the leg when you are not up walking on it.   Continue to use the breathing machine which will help keep your temperature down.  It is common for your temperature to cycle up and down following surgery, especially at night when you are not up moving around and exerting yourself.  The breathing machine keeps your lungs expanded and your temperature down.  DIET You may resume your previous home diet once your are discharged from the hospital.  DRESSING / WOUND CARE / SHOWERING You may change your dressing 3-5 days after surgery.  Then change the dressing every day with sterile gauze.  Please use good hand washing techniques before changing the dressing.  Do not use any lotions or creams on the incision until instructed by your surgeon. You may start showering once you are discharged home but do not submerge the incision under water. Just pat the incision dry and apply a dry gauze dressing on daily. Change the surgical dressing daily and reapply a dry dressing each time.  ACTIVITY Walk with your walker as instructed. Use walker as long as suggested by your caregivers. Avoid periods of inactivity such as sitting longer than an hour when not asleep. This helps prevent blood clots.  You may resume a sexual relationship in one month or when given the OK by your doctor.  You may return to work once you are cleared by your doctor.  Do not drive a car for 6 weeks or until released by you surgeon.  Do not drive while taking narcotics.  WEIGHT BEARING Weight bearing as tolerated with assist device (walker, cane, etc) as directed, use it as long as suggested by your surgeon or therapist, typically at least 4-6 weeks.  POSTOPERATIVE CONSTIPATION PROTOCOL Constipation - defined medically as fewer than three stools per week and severe constipation as less than one stool per week.  One of the most common issues patients have following surgery is constipation.  Even if you have  a regular bowel pattern at home, your normal regimen is likely to be disrupted due to multiple reasons following surgery.  Combination of anesthesia, postoperative narcotics, change in appetite and fluid intake all can affect your bowels.  In order to avoid complications following surgery, here are some recommendations in order to help you during your recovery period.  Colace (docusate) - Pick up an over-the-counter form of Colace or another stool softener and take twice a day as long as you are requiring postoperative  pain medications.  Take with a full glass of water daily.  If you experience loose stools or diarrhea, hold the colace until you stool forms back up.  If your symptoms do not get better within 1 week or if they get worse, check with your doctor.  Dulcolax (bisacodyl) - Pick up over-the-counter and take as directed by the product packaging as needed to assist with the movement of your bowels.  Take with a full glass of water.  Use this product as needed if not relieved by Colace only.   MiraLax (polyethylene glycol) - Pick up over-the-counter to have on hand.  MiraLax is a solution that will increase the amount of water in your bowels to assist with bowel movements.  Take as directed and can mix with a glass of water, juice, soda, coffee, or tea.  Take if you go more than two days without a movement. Do not use MiraLax more than once per day. Call your doctor if you are still constipated or irregular after using this medication for 7 days in a row.  If you continue to have problems with postoperative constipation, please contact the office for further assistance and recommendations.  If you experience "the worst abdominal pain ever" or develop nausea or vomiting, please contact the office immediatly for further recommendations for treatment.  ITCHING  If you experience itching with your medications, try taking only a single pain pill, or even half a pain pill at a time.  You can also use  Benadryl over the counter for itching or also to help with sleep.   TED HOSE STOCKINGS Wear the elastic stockings on both legs for three weeks following surgery during the day but you may remove then at night for sleeping.  MEDICATIONS See your medication summary on the "After Visit Summary" that the nursing staff will review with you prior to discharge.  You may have some home medications which will be placed on hold until you complete the course of blood thinner medication.  It is important for you to complete the blood thinner medication as prescribed by your surgeon.  Continue your approved medications as instructed at time of discharge.  PRECAUTIONS If you experience chest pain or shortness of breath - call 911 immediately for transfer to the hospital emergency department.  If you develop a fever greater that 101 F, purulent drainage from wound, increased redness or drainage from wound, foul odor from the wound/dressing, or calf pain - CONTACT YOUR SURGEON.                                                   FOLLOW-UP APPOINTMENTS Make sure you keep all of your appointments after your operation with your surgeon and caregivers. You should call the office at the above phone number and make an appointment for approximately two weeks after the date of your surgery or on the date instructed by your surgeon outlined in the "After Visit Summary".  RANGE OF MOTION AND STRENGTHENING EXERCISES  These exercises are designed to help you keep full movement of your hip joint. Follow your caregiver's or physical therapist's instructions. Perform all exercises about fifteen times, three times per day or as directed. Exercise both hips, even if you have had only one joint replacement. These exercises can be done on a training (exercise) mat, on the floor, on  a table or on a bed. Use whatever works the best and is most comfortable for you. Use music or television while you are exercising so that the exercises are  a pleasant break in your day. This will make your life better with the exercises acting as a break in routine you can look forward to.  Lying on your back, slowly slide your foot toward your buttocks, raising your knee up off the floor. Then slowly slide your foot back down until your leg is straight again.  Lying on your back spread your legs as far apart as you can without causing discomfort.  Lying on your side, raise your upper leg and foot straight up from the floor as far as is comfortable. Slowly lower the leg and repeat.  Lying on your back, tighten up the muscle in the front of your thigh (quadriceps muscles). You can do this by keeping your leg straight and trying to raise your heel off the floor. This helps strengthen the largest muscle supporting your knee.  Lying on your back, tighten up the muscles of your buttocks both with the legs straight and with the knee bent at a comfortable angle while keeping your heel on the floor.   IF YOU ARE TRANSFERRED TO A SKILLED REHAB FACILITY If the patient is transferred to a skilled rehab facility following release from the hospital, a list of the current medications will be sent to the facility for the patient to continue.  When discharged from the skilled rehab facility, please have the facility set up the patient's [place] prior to being released. Also, the skilled facility will be responsible for providing the patient with their medications at time of release from the facility to include their pain medication, the muscle relaxants, and their blood thinner medication. If the patient is still at the rehab facility at time of the two week follow up appointment, the skilled rehab facility will also need to assist the patient in arranging follow up appointment in our office and any transportation needs.  MAKE SURE YOU:  Understand these instructions.  Get help right away if you are not doing well or get worse.    Pick up stool  softner and laxative for home use following surgery while on pain medications. Do not submerge incision under water. Please use good hand washing techniques while changing dressing each day. May shower starting three days after surgery. Please use a clean towel to pat the incision dry following showers. Continue to use ice for pain and swelling after surgery. Do not use any lotions or creams on the incision until instructed by your surgeon.   Do not sit on low chairs, stoools or toilet seats, as it may be difficult to get up from low surfaces   Complete by: As directed    Driving restrictions   Complete by: As directed    No driving for two weeks   TED hose   Complete by: As directed    Use stockings (TED hose) for three weeks on both leg(s).  You may remove them at night for sleeping.   Weight bearing as tolerated   Complete by: As directed      Allergies as of 08/25/2018      Reactions   Codeine Nausea And Vomiting   GI upset    Tramadol    hallucinations       Medication List    STOP taking these medications   diclofenac 75 MG EC  tablet Commonly known as: VOLTAREN   oxyCODONE 5 MG immediate release tablet Commonly known as: Oxy IR/ROXICODONE     TAKE these medications   acetaminophen 500 MG tablet Commonly known as: TYLENOL Take 1,000 mg by mouth every 6 (six) hours as needed for moderate pain or headache.   allopurinol 100 MG tablet Commonly known as: ZYLOPRIM Take 100 mg by mouth daily.   aspirin 325 MG EC tablet Take 1 tablet (325 mg total) by mouth 2 (two) times daily for 19 days. Then take one 81 mg aspirin once a day for three weeks. Then discontinue aspirin. What changed: additional instructions   HYDROcodone-acetaminophen 5-325 MG tablet Commonly known as: NORCO/VICODIN Take 1-2 tablets by mouth every 6 (six) hours as needed for moderate pain (pain score 4-6).   losartan 25 MG tablet Commonly known as: COZAAR Take 25 mg by mouth daily.   Melatonin 5  MG Tabs Take 5 mg by mouth at bedtime.   methocarbamol 500 MG tablet Commonly known as: ROBAXIN Take 1 tablet (500 mg total) by mouth every 6 (six) hours as needed for muscle spasms.   omeprazole 20 MG capsule Commonly known as: PRILOSEC Take 1 capsule (20 mg total) by mouth 2 (two) times daily before a meal.   pantoprazole 40 MG tablet Commonly known as: PROTONIX Take 40 mg by mouth daily.   polyethylene glycol 17 g packet Commonly known as: MIRALAX / GLYCOLAX Take 17 g by mouth daily.   simvastatin 20 MG tablet Commonly known as: ZOCOR Take 20 mg by mouth at bedtime.   trolamine salicylate 10 % cream Commonly known as: ASPERCREME Apply 1 application topically as needed (leg pain).            Discharge Care Instructions  (From admission, onward)         Start     Ordered   08/25/18 0000  Weight bearing as tolerated     08/25/18 0728   08/25/18 0000  Change dressing    Comments: You may change the dressing daily with sterile 4 x 4 inch gauze dressing and paper tape.   08/25/18 0728         Follow-up Information    Gaynelle Arabian, MD. Schedule an appointment as soon as possible for a visit on 09/07/2018.   Specialty: Orthopedic Surgery Contact information: [address] [place] [address] [phone]           Signed: Theresa Duty, PA-C Orthopedic Surgery 08/28/2018, 9:10 AM

## 2019-11-07 ENCOUNTER — Encounter: Payer: Self-pay | Admitting: Internal Medicine

## 2019-11-29 ENCOUNTER — Other Ambulatory Visit: Payer: Self-pay

## 2019-11-29 ENCOUNTER — Ambulatory Visit: Payer: Medicare Other | Admitting: Internal Medicine

## 2019-11-29 ENCOUNTER — Encounter: Payer: Self-pay | Admitting: Internal Medicine

## 2019-11-29 VITALS — BP 150/88 | HR 77 | Temp 97.3°F | Ht 60.0 in | Wt 171.2 lb

## 2019-11-29 DIAGNOSIS — R1319 Other dysphagia: Secondary | ICD-10-CM

## 2019-11-29 DIAGNOSIS — R1013 Epigastric pain: Secondary | ICD-10-CM | POA: Diagnosis not present

## 2019-11-29 DIAGNOSIS — G8929 Other chronic pain: Secondary | ICD-10-CM | POA: Diagnosis not present

## 2019-11-29 NOTE — Patient Instructions (Signed)
We will schedule you for barium swallow testing today in clinic.  Pending results, we may need to consider upper endoscopy in the near future.  Further recommendations to follow.  Continue on Protonix 40 mg daily for now.  At [facility] we value your feedback. You may receive a survey about your visit today. Please share your experience as we strive to create trusting relationships with our patients to provide genuine, compassionate, quality care.  We appreciate your understanding and patience as we review any laboratory studies, imaging, and other diagnostic tests that are ordered as we care for you. Our office policy is 5 business days for review of these results, and any emergent or urgent results are addressed in a timely manner for your best interest. If you do not hear from our office in 1 week, please contact us.   We also encourage the use of MyChart, which contains your medical information for your review as well. If you are not enrolled in this feature, an access code is on this after visit summary for your convenience. Thank you for allowing [place] to be involved in your care.  It was great to see you today!  I hope you have a great rest of your fall!!    Nona Gracey K. Abbey Chatters, D.O. Gastroenterology and Hepatology [facility]

## 2019-11-29 NOTE — Progress Notes (Signed)
Primary Care Physician:  [name], MD Primary Gastroenterologist:  Dr. Abbey Chatters  Chief Complaint  Patient presents with  . Dysphagia    last EGD 2016  . Abdominal Pain    upper abd, comes/goes    HPI:   Olivia Powers is a 79 y.o. female who presents to the clinic today by referral from her PCP Dr. [name] for evaluation for dysphagia.  Patient states she has had issues for years but recently is progressively worsening.  States pills will get stuck in her substernal region.  Also has issues with some types of food.  Currently takes Protonix 40 mg daily which she has been on for years.  Also notes epigastric pain which is intermittent in nature.  Does not radiate.  No chronic NSAID use.  No history of PUD or H. pylori.  No melena or hematochezia.  No inhalers.  Otherwise she has no other complaints.  Past Medical History:  Diagnosis Date  . Alopecia   . Arthritis   . Cancer ([place])    skin cancer of chest melanoma  . Diabetes mellitus without complication ([place])    taken off Metformin by PCP this year.  . Gout   . 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 Hypercholesteremia   . Hypertension     Past Surgical History:  Procedure Laterality Date  . ABDOMINAL HYSTERECTOMY    . BACK SURGERY     lower  . CATARACT EXTRACTION W/PHACO Left 02/11/2014   Procedure: CATARACT EXTRACTION PHACO AND INTRAOCULAR LENS PLACEMENT LEFT EYE;  Surgeon: Tonny Branch, MD;  [facility];  Service: Ophthalmology;  Laterality: Left;  CDE:4.93  . CATARACT EXTRACTION W/PHACO Right 03/11/2014   Procedure: CATARACT EXTRACTION PHACO AND INTRAOCULAR LENS PLACEMENT RIGHT EYE CDE=8.52;  Surgeon: Tonny Branch, MD;  Location: [facility];  Service: Ophthalmology;  Laterality: Right;  . CERVICAL DISC SURGERY    . CESAREAN SECTION     x2  . CRANIECTOMY FOR EXCISION OF ACOUSTIC NEUROMA Left 1990, 1992  . ESOPHAGOGASTRODUODENOSCOPY N/A 04/22/2014   Procedure: ESOPHAGOGASTRODUODENOSCOPY (EGD);   Surgeon: [name], MD;  Location: [place];  Service: Endoscopy;  Laterality: N/A;  . TOTAL HIP ARTHROPLASTY Left 07/06/2017   Procedure: LEFT TOTAL HIP ARTHROPLASTY ANTERIOR APPROACH;  Surgeon: Gaynelle Arabian, MD;  Location: [facility];  Service: Orthopedics;  Laterality: Left;  Failed Spinal  . TOTAL HIP ARTHROPLASTY Right 08/23/2018   Procedure: TOTAL HIP ARTHROPLASTY ANTERIOR APPROACH;  Surgeon: Gaynelle Arabian, MD;  Location: [facility];  Service: Orthopedics;  Laterality: Right;  155min  . TOTAL KNEE ARTHROPLASTY Bilateral 1990    Current Outpatient Medications  Medication Sig Dispense Refill  . acetaminophen (TYLENOL) 500 MG tablet Take 1,000 mg by mouth every 6 (six) hours as needed for moderate pain or headache.     . allopurinol (ZYLOPRIM) 100 MG tablet Take 100 mg by mouth daily.    Marland Kitchen gabapentin (NEURONTIN) 300 MG capsule Take 150 mg by mouth at bedtime.    Marland Kitchen losartan (COZAAR) 25 MG tablet Take 25 mg by mouth daily.    . Melatonin 5 MG TABS Take 10 mg by mouth at bedtime.     . pantoprazole (PROTONIX) 40 MG tablet Take 40 mg by mouth daily.    . polyethylene glycol (MIRALAX / GLYCOLAX) packet Take 17 g by mouth daily.    . simvastatin (ZOCOR) 20 MG tablet Take 20 mg by mouth at bedtime.  No current facility-administered medications for this visit.    Allergies as of 11/29/2019 - Review Complete 11/29/2019  Allergen Reaction Noted  . Codeine Nausea And Vomiting 02/07/2014  . Tramadol  06/30/2017    No family history on file.  Social History   Socioeconomic History  . Marital status: Widowed    Spouse name: Not on file  . Number of children: Not on file  . Years of education: Not on file  . Highest education level: Not on file  Occupational History  . Not on file  Tobacco Use  . Smoking status: Never Smoker  . Smokeless tobacco: Never Used  Vaping Use  . Vaping Use: Never used  Substance and Sexual Activity  . Alcohol use: No  . Drug use: No  . Sexual  activity: Not Currently    Birth control/protection: Surgical  Other Topics Concern  . Not on file  Social History Narrative  . Not on file   Social Determinants of Health   Financial Resource Strain:   . Difficulty of Paying Living Expenses: Not on file  Food Insecurity:   . Worried About Charity fundraiser in the Last Year: Not on file  . Ran Out of Food in the Last Year: Not on file  Transportation Needs:   . Lack of Transportation (Medical): Not on file  . Lack of Transportation (Non-Medical): Not on file  Physical Activity:   . Days of Exercise per Week: Not on file  . Minutes of Exercise per Session: Not on file  Stress:   . Feeling of Stress : Not on file  Social Connections:   . Frequency of Communication with Friends and Family: Not on file  . Frequency of Social Gatherings with Friends and Family: Not on file  . Attends Religious Services: Not on file  . Active Member of Clubs or Organizations: Not on file  . Attends Archivist Meetings: Not on file  . Marital Status: Not on file  Intimate Partner Violence:   . Fear of Current or Ex-Partner: Not on file  . Emotionally Abused: Not on file  . Physically Abused: Not on file  . Sexually Abused: Not on file    Subjective: Review of Systems  Constitutional: Negative for chills and fever.  HENT: Negative for congestion and hearing loss.   Eyes: Negative for blurred vision and double vision.  Respiratory: Negative for cough and shortness of breath.   Cardiovascular: Negative for chest pain and palpitations.  Gastrointestinal: Positive for heartburn. Negative for abdominal pain, blood in stool, constipation, diarrhea, melena and vomiting.       Dysphagia   Genitourinary: Negative for dysuria and urgency.  Musculoskeletal: Negative for joint pain and myalgias.  Skin: Negative for itching and rash.  Neurological: Negative for dizziness and headaches.  Psychiatric/Behavioral: Negative for depression. The  patient is not nervous/anxious.        Objective: BP (!) 150/88   Pulse 77   Temp (!) 97.3 F (36.3 C)   Ht 5' (1.524 m)   Wt 171 lb 3.2 oz (77.7 kg)   BMI 33.44 kg/m  Physical Exam Constitutional:      Appearance: Normal appearance.  HENT:     Head: Normocephalic and atraumatic.  Eyes:     Extraocular Movements: Extraocular movements intact.     Conjunctiva/sclera: Conjunctivae normal.  Cardiovascular:     Rate and Rhythm: Normal rate and regular rhythm.  Pulmonary:     Effort: Pulmonary effort is normal.  Breath sounds: Normal breath sounds.  Abdominal:     General: Bowel sounds are normal.     Palpations: Abdomen is soft.  Musculoskeletal:        General: No swelling. Normal range of motion.     Cervical back: Normal range of motion and neck supple.  Skin:    General: Skin is warm and dry.     Coloration: Skin is not jaundiced.  Neurological:     General: No focal deficit present.     Mental Status: She is alert and oriented to person, place, and time.  Psychiatric:        Mood and Affect: Mood normal.        Behavior: Behavior normal.      Assessment: *Dysphagia-progressively worsening *Epigastric pain  Plan: Discussed differential for patient's upper GI issues including peptic ulcer disease, esophagitis, gastritis, H. Pylori, duodenitis, esophageal stricture, Schatzki's ring, esophageal web, malignancy or other.   I recommended that she undergo EGD with potential dilation and patient would like to hold off for now.  We will start her work-up with modified barium exam and go from there.  Continue on Protonix 40 mg daily  Further recommendations to follow.  Thank you Dr. [name] for the kind referral.  11/29/2019 11:47 AM   Disclaimer: This note was dictated with voice recognition software. Similar sounding words can inadvertently be transcribed and may not be corrected upon review.

## 2019-11-29 NOTE — Progress Notes (Unsigned)
g

## 2019-12-04 ENCOUNTER — Ambulatory Visit (HOSPITAL_COMMUNITY): Payer: Medicare Other

## 2019-12-06 ENCOUNTER — Other Ambulatory Visit: Payer: Self-pay

## 2019-12-06 ENCOUNTER — Ambulatory Visit (HOSPITAL_COMMUNITY)
Admission: RE | Admit: 2019-12-06 | Discharge: 2019-12-06 | Disposition: A | Discharge status: Home / Self Care | Payer: Medicare Other | Source: Ambulatory Visit | Attending: Internal Medicine | Admitting: Internal Medicine

## 2019-12-06 DIAGNOSIS — R1319 Other dysphagia: Secondary | ICD-10-CM | POA: Insufficient documentation

## 2020-01-31 ENCOUNTER — Ambulatory Visit: Payer: Medicare Other | Admitting: Internal Medicine

## 2020-01-31 ENCOUNTER — Encounter: Payer: Self-pay | Admitting: Internal Medicine

## 2020-01-31 ENCOUNTER — Other Ambulatory Visit: Payer: Self-pay

## 2020-01-31 ENCOUNTER — Other Ambulatory Visit: Payer: Self-pay | Admitting: *Deleted

## 2020-01-31 DIAGNOSIS — K224 Dyskinesia of esophagus: Secondary | ICD-10-CM

## 2020-01-31 DIAGNOSIS — K449 Diaphragmatic hernia without obstruction or gangrene: Secondary | ICD-10-CM | POA: Insufficient documentation

## 2020-01-31 DIAGNOSIS — R1319 Other dysphagia: Secondary | ICD-10-CM

## 2020-01-31 DIAGNOSIS — R1310 Dysphagia, unspecified: Secondary | ICD-10-CM | POA: Insufficient documentation

## 2020-01-31 DIAGNOSIS — K5900 Constipation, unspecified: Secondary | ICD-10-CM | POA: Diagnosis not present

## 2020-01-31 NOTE — Progress Notes (Signed)
Referring Provider: [name], MD Primary Care Physician:  [name], MD Primary GI:  Dr. Abbey Chatters  Chief Complaint  Patient presents with  . Dysphagia    Some better. Wants to discuss BPE result  . Abdominal Pain    Occ, across upper abd  . Constipation    Miralax stopped working    HPI:   Olivia Powers is a 79 y.o. female who presents to the clinic today for follow-up visit.  Previously seen for dysphagia.  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  Patient states her symptoms are relatively improved on the Protonix 40 mg daily though she still has issues intermittently.  Her main complaint for me is chronic constipation.  She states she has had this for the last 3 years and MiraLAX usually works but apparently has stopped more recently.  She states she called MiraLAX and asked if they change their compounding to which they said no.  No melena hematochezia.  No lower abdominal pain.  Past Medical History:  Diagnosis Date  . Alopecia   . Arthritis   . Cancer ([place])    skin cancer of chest melanoma  . Diabetes mellitus without complication ([place])    taken off Metformin by PCP this year.  . Gout   . 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 Hypercholesteremia   . Hypertension     Past Surgical History:  Procedure Laterality Date  . ABDOMINAL HYSTERECTOMY    . BACK SURGERY     lower  . CATARACT EXTRACTION W/PHACO Left 02/11/2014   Procedure: CATARACT EXTRACTION PHACO AND INTRAOCULAR LENS PLACEMENT LEFT EYE;  Surgeon: Tonny Branch, MD;  [facility]: [facility];  Service: Ophthalmology;  Laterality: Left;  CDE:4.93  . CATARACT EXTRACTION W/PHACO Right 03/11/2014   Procedure: CATARACT EXTRACTION PHACO AND INTRAOCULAR LENS PLACEMENT RIGHT EYE CDE=8.52;  Surgeon: Tonny Branch, MD;  Location: [facility];  Service: Ophthalmology;  Laterality: Right;  . CERVICAL DISC SURGERY    . CESAREAN SECTION      x2  . CRANIECTOMY FOR EXCISION OF ACOUSTIC NEUROMA Left 1990, 1992  . ESOPHAGOGASTRODUODENOSCOPY N/A 04/22/2014   Procedure: ESOPHAGOGASTRODUODENOSCOPY (EGD);  Surgeon: [name], MD;  Location: [place];  Service: Endoscopy;  Laterality: N/A;  . TOTAL HIP ARTHROPLASTY Left 07/06/2017   Procedure: LEFT TOTAL HIP ARTHROPLASTY ANTERIOR APPROACH;  Surgeon: Gaynelle Arabian, MD;  [facility];  Service: Orthopedics;  Laterality: Left;  Failed Spinal  . TOTAL HIP ARTHROPLASTY Right 08/23/2018   Procedure: TOTAL HIP ARTHROPLASTY ANTERIOR APPROACH;  Surgeon: Gaynelle Arabian, MD;  [facility];  Service: Orthopedics;  Laterality: Right;  145min  . TOTAL KNEE ARTHROPLASTY Bilateral 1990    Current Outpatient Medications  Medication Sig Dispense Refill  . acetaminophen (TYLENOL) 500 MG tablet Take 1,000 mg by mouth every 6 (six) hours as needed for moderate pain or headache.     . allopurinol (ZYLOPRIM) 100 MG tablet Take 100 mg by mouth daily.    . cilostazol (PLETAL) 100 MG tablet Take 100 mg by mouth 2 (two) times daily.    Marland Kitchen gabapentin (NEURONTIN) 300 MG capsule Take 300 mg by mouth at bedtime.    Marland Kitchen losartan (COZAAR) 25 MG tablet Take 25 mg by mouth daily.    . Melatonin 5 MG TABS Take 10 mg by mouth at bedtime.    . pantoprazole (PROTONIX) 40 MG tablet Take 40  mg by mouth daily.    . polyethylene glycol (MIRALAX / GLYCOLAX) packet Take 17 g by mouth daily as needed.    . simvastatin (ZOCOR) 20 MG tablet Take 20 mg by mouth at bedtime.      No current facility-administered medications for this visit.    Allergies as of 01/31/2020 - Review Complete 01/31/2020  Allergen Reaction Noted  . Codeine Nausea And Vomiting 02/07/2014  . Tramadol  06/30/2017    No family history on file.  Social History   Socioeconomic History  . Marital status: Widowed    Spouse name: Not on file  . Number of children: Not on file  . Years of education: Not on file  . Highest education level:  Not on file  Occupational History  . Not on file  Tobacco Use  . Smoking status: Never Smoker  . Smokeless tobacco: Never Used  Vaping Use  . Vaping Use: Never used  Substance and Sexual Activity  . Alcohol use: No  . Drug use: No  . Sexual activity: Not Currently    Birth control/protection: Surgical  Other Topics Concern  . Not on file  Social History Narrative  . Not on file   Social Determinants of Health   Financial Resource Strain: Not on file  Food Insecurity: Not on file  Transportation Needs: Not on file  Physical Activity: Not on file  Stress: Not on file  Social Connections: Not on file    Subjective: Review of Systems  Constitutional: Negative for chills and fever.  HENT: Negative for congestion and hearing loss.   Eyes: Negative for blurred vision and double vision.  Respiratory: Negative for cough and shortness of breath.   Cardiovascular: Negative for chest pain and palpitations.  Gastrointestinal: Positive for constipation. Negative for abdominal pain, blood in stool, diarrhea, heartburn, melena and vomiting.       Dysphagia  Genitourinary: Negative for dysuria and urgency.  Musculoskeletal: Negative for joint pain and myalgias.  Skin: Negative for itching and rash.  Neurological: Negative for dizziness and headaches.  Psychiatric/Behavioral: Negative for depression. The patient is not nervous/anxious.      Objective: BP 129/73   Pulse 81   Temp (!) 97.1 F (36.2 C) (Temporal)   Ht 5' (1.524 m)   Wt 169 lb 9.6 oz (76.9 kg)   BMI 33.12 kg/m  Physical Exam Constitutional:      Appearance: Normal appearance.  HENT:     Head: Normocephalic and atraumatic.  Eyes:     Extraocular Movements: Extraocular movements intact.     Conjunctiva/sclera: Conjunctivae normal.  Cardiovascular:     Rate and Rhythm: Normal rate and regular rhythm.  Pulmonary:     Effort: Pulmonary effort is normal.     Breath sounds: Normal breath sounds.  Abdominal:      General: Bowel sounds are normal.     Palpations: Abdomen is soft.  Musculoskeletal:        General: No swelling. Normal range of motion.     Cervical back: Normal range of motion and neck supple.  Skin:    General: Skin is warm and dry.     Coloration: Skin is not jaundiced.  Neurological:     General: No focal deficit present.     Mental Status: She is alert and oriented to person, place, and time.  Psychiatric:        Mood and Affect: Mood normal.        Behavior: Behavior normal.  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  Appears that she does have dysmotility of her esophagus.  Also noted large hiatal hernia.  Discussed that her hiatal hernia may be playing a role in her symptoms though she would not be an ideal candidate for surgical fixation given her age as well as underlying dysmotility.  I will refer her to speech therapy to help with home practices for safe swallowing and appropriate diet.  Appreciate their help with this patient.  In regards to patient's constipation, also discussed this in depth with her today.  Recommend she increase her MiraLAX to 17 g twice daily.  If this is not enough she can add Dulcolax 1 to 2 tablets daily as well.  Recommend she drink at least 4 to 6 glasses of water daily.  Patient to follow-up in 3 months or sooner if needed  01/31/2020 1:42 PM   Disclaimer: This note was dictated with voice recognition software. Similar sounding words can inadvertently be transcribed and may not be corrected upon review.

## 2020-01-31 NOTE — Patient Instructions (Addendum)
In regards to your constipation, I want you to start taking MiraLAX 1 capful twice daily. If this is not enough, I would add 1 to 2 tablets of Dulcolax. Ensure that you are drinking at least 4 to 6 glasses of water daily.  In regards to difficulty swallowing it appears that you have a large hiatal hernia which is where part of your stomach herniates into your chest cavity. The swallowing study also showed that your esophagus is not working correctly from a motility aspect either.  Unfortunately there is not much we can do about this as we cannot send you for hiatal hernia repair (surgery) as this would likely make your esophageal issues worse.  I am going to refer you to speech therapy to discuss potential strategies to decrease your symptoms. I want you to stay on pantoprazole 40 mg daily.  Follow-up with me in 3 months or sooner if needed.  At [facility] we value your feedback. You may receive a survey about your visit today. Please share your experience as we strive to create trusting relationships with our patients to provide genuine, compassionate, quality care.  We appreciate your understanding and patience as we review any laboratory studies, imaging, and other diagnostic tests that are ordered as we care for you. Our office policy is 5 business days for review of these results, and any emergent or urgent results are addressed in a timely manner for your best interest. If you do not hear from our office in 1 week, please contact us.   We also encourage the use of MyChart, which contains your medical information for your review as well. If you are not enrolled in this feature, an access code is on this after visit summary for your convenience. Thank you for allowing [place] to be involved in your care.  It was great to see you today!  I hope you have a great rest of your winter!!    Hennie Duos. Marletta Lor, D.O. Gastroenterology and Hepatology [facility]

## 2020-02-05 ENCOUNTER — Telehealth: Payer: Self-pay | Admitting: Internal Medicine

## 2020-02-05 ENCOUNTER — Other Ambulatory Visit (HOSPITAL_COMMUNITY): Payer: Self-pay | Admitting: Specialist

## 2020-02-05 DIAGNOSIS — R1319 Other dysphagia: Secondary | ICD-10-CM

## 2020-02-05 NOTE — Telephone Encounter (Signed)
Pt has questions about what type of test Dr Marletta Lor wanted her to have done. Please call her at [phone]

## 2020-02-05 NOTE — Telephone Encounter (Signed)
Pt returned the call and I advised her that Dr. Marletta Lor is having her evaluated with a speech therapist that will help her at home regarding eating, drinking, and speech. Also advised her to keep her appt at [facility]. Pt agreed

## 2020-02-05 NOTE — Telephone Encounter (Signed)
Phoned the pt twice and her phone line was busy.

## 2020-02-08 ENCOUNTER — Other Ambulatory Visit: Payer: Self-pay

## 2020-02-08 ENCOUNTER — Ambulatory Visit (HOSPITAL_COMMUNITY): Payer: Medicare Other | Attending: Internal Medicine | Admitting: Speech Pathology

## 2020-02-08 ENCOUNTER — Encounter (HOSPITAL_COMMUNITY): Payer: Self-pay | Admitting: Speech Pathology

## 2020-02-08 ENCOUNTER — Ambulatory Visit (HOSPITAL_COMMUNITY)
Admission: RE | Admit: 2020-02-08 | Discharge: 2020-02-08 | Disposition: A | Discharge status: Home / Self Care | Payer: Medicare Other | Source: Ambulatory Visit | Attending: Internal Medicine | Admitting: Internal Medicine

## 2020-02-08 DIAGNOSIS — R1319 Other dysphagia: Secondary | ICD-10-CM | POA: Insufficient documentation

## 2020-02-08 NOTE — Therapy (Signed)
[place], [place], [address] Phone: [phone]   Fax:  [phone]  Modified Barium Swallow  Patient Details  Name: Olivia Powers MRN: [id number] Date of Birth: 1940/05/26 No data recorded  Encounter Date: 02/08/2020    Past Medical History:  Diagnosis Date  . Alopecia   . Arthritis   . Cancer ([place])    skin cancer of chest melanoma  . Diabetes mellitus without complication ([place])    taken off Metformin by PCP this year.  . Gout   . 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 Hypercholesteremia   . Hypertension     Past Surgical History:  Procedure Laterality Date  . ABDOMINAL HYSTERECTOMY    . BACK SURGERY     lower  . CATARACT EXTRACTION W/PHACO Left 02/11/2014   Procedure: CATARACT EXTRACTION PHACO AND INTRAOCULAR LENS PLACEMENT LEFT EYE;  Surgeon: Tonny Branch, MD;  [facility]: [facility];  Service: Ophthalmology;  Laterality: Left;  CDE:4.93  . CATARACT EXTRACTION W/PHACO Right 03/11/2014   Procedure: CATARACT EXTRACTION PHACO AND INTRAOCULAR LENS PLACEMENT RIGHT EYE CDE=8.52;  Surgeon: Tonny Branch, MD;  Location: [facility];  Service: Ophthalmology;  Laterality: Right;  . CERVICAL DISC SURGERY    . CESAREAN SECTION     x2  . CRANIECTOMY FOR EXCISION OF ACOUSTIC NEUROMA Left 1990, 1992  . ESOPHAGOGASTRODUODENOSCOPY N/A 04/22/2014   Procedure: ESOPHAGOGASTRODUODENOSCOPY (EGD);  Surgeon: [name], MD;  Location: [facility];  Service: Endoscopy;  Laterality: N/A;  . TOTAL HIP ARTHROPLASTY Left 07/06/2017   Procedure: LEFT TOTAL HIP ARTHROPLASTY ANTERIOR APPROACH;  Surgeon: Gaynelle Arabian, MD;  [facility];  Service: Orthopedics;  Laterality: Left;  Failed Spinal  . TOTAL HIP ARTHROPLASTY Right 08/23/2018   Procedure: TOTAL HIP ARTHROPLASTY ANTERIOR APPROACH;  Surgeon: Gaynelle Arabian, MD;  [facility];  Service: Orthopedics;  Laterality: Right;  170min  . TOTAL KNEE  ARTHROPLASTY Bilateral 1990    There were no vitals filed for this visit.  HPI: Olivia Powers is a 80 y.o. female who presents to the clinic today for follow-up visit.  Previously seen for dysphagia.  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  Patient states her symptoms are relatively improved on the Protonix 40 mg daily though she still has issues intermittently.  GI plan note reports: Appears that she does have dysmotility of her esophagus & noted large hiatal hernia.  Hiatal hernia may be playing a role in her symptoms though she would not be an ideal candidate for surgical fixation given her age as well as underlying dysmotility.  GI placed referral for speech therapy to help with home practices for safe swallowing and appropriate diet. MBS to be completed   No data recorded   Assessment / Plan / Recommendation  CHL IP CLINICAL IMPRESSIONS 02/08/2020  Clinical Impression Pt's oropharyngeal swallowing presented to be grossly Within Normal Limits today, however Pt with known esophageal dysphagia. Note occasional flash penetration of thin liquids, however, this is likely a normal swallowing difference. Pt demonstrated good hyolaryngeal excursion, adequate laryngeal vestibule closure and good airway protection. Esophageal sweep reveals standing column of barium with to and fro movements; standing column did eventually pass through - radiologist present to confirm. Pt reported globus sensation throughout study and continued to report sensation even after liquid/textures had passed through. Note belching and other s/sx of esophageal dysphagia throughout and after  study including continued globus sensation, belching and nausea. SLP reviewed esophageal precautions including: small bites/sips, slow rate, thorough mastication, multiple small meals, taking a break from eating when Pt feels globus sensation, recommend try drinking a hot/warm  beverage before meals to see if of benefit to esophageal symptoms. Recommend continue with current diet D3/thin and meds are ok whole with liquids - if continued difficulty with large tablets try whole with applesauce. There are no further ST needs noted at this time, defer to GI for further recommendations and treatment of esophageal dysphagia.   SLP Visit Diagnosis Dysphagia, unspecified (R13.10)  Attention and concentration deficit following --  Frontal lobe and executive function deficit following --  Impact on safety and function Mild aspiration risk      CHL IP TREATMENT RECOMMENDATION 02/08/2020  Treatment Recommendations No treatment recommended at this time     Prognosis 02/08/2020  Prognosis for Safe Diet Advancement Good  Barriers to Reach Goals --  Barriers/Prognosis Comment --    CHL IP DIET RECOMMENDATION 02/08/2020  SLP Diet Recommendations Dysphagia 3 (Mech soft) solids;Thin liquid  Liquid Administration via Cup;Straw  Medication Administration Whole meds with liquid  Compensations Minimize environmental distractions;Slow rate;Small sips/bites;Follow solids with liquid  Postural Changes Remain semi-upright after after feeds/meals (Comment);Seated upright at 90 degrees      CHL IP OTHER RECOMMENDATIONS 02/08/2020  Recommended Consults --  Oral Care Recommendations Oral care BID  Other Recommendations --      CHL IP FOLLOW UP RECOMMENDATIONS 02/08/2020  Follow up Recommendations None      No flowsheet data found.         CHL IP ORAL PHASE 02/08/2020  Oral Phase WFL  Oral - Pudding Teaspoon --  Oral - Pudding Cup --  Oral - Honey Teaspoon --  Oral - Honey Cup --  Oral - Nectar Teaspoon --  Oral - Nectar Cup --  Oral - Nectar Straw --  Oral - Thin Teaspoon --  Oral - Thin Cup --  Oral - Thin Straw --  Oral - Puree --  Oral - Mech Soft --  Oral - Regular --  Oral - Multi-Consistency --  Oral - Pill --  Oral Phase - Comment --    CHL IP PHARYNGEAL PHASE 02/08/2020   Pharyngeal Phase WFL  Pharyngeal- Pudding Teaspoon --  Pharyngeal --  Pharyngeal- Pudding Cup --  Pharyngeal --  Pharyngeal- Honey Teaspoon --  Pharyngeal --  Pharyngeal- Honey Cup --  Pharyngeal --  Pharyngeal- Nectar Teaspoon --  Pharyngeal --  Pharyngeal- Nectar Cup --  Pharyngeal --  Pharyngeal- Nectar Straw --  Pharyngeal --  Pharyngeal- Thin Teaspoon --  Pharyngeal --  Pharyngeal- Thin Cup --  Pharyngeal --  Pharyngeal- Thin Straw --  Pharyngeal --  Pharyngeal- Puree --  Pharyngeal --  Pharyngeal- Mechanical Soft --  Pharyngeal --  Pharyngeal- Regular --  Pharyngeal --  Pharyngeal- Multi-consistency --  Pharyngeal --  Pharyngeal- Pill --  Pharyngeal --  Pharyngeal Comment --     CHL IP CERVICAL ESOPHAGEAL PHASE 02/08/2020  Cervical Esophageal Phase Impaired  Pudding Teaspoon --  Pudding Cup --  Honey Teaspoon --  Honey Cup --  Nectar Teaspoon --  Nectar Cup --  Nectar Straw --  Thin Teaspoon --  Thin Cup --  Thin Straw --  Puree --  Mechanical Soft --  Regular --  Multi-consistency --  Pill --  Cervical Esophageal Comment --    Charlies Rayburn H. Roddie Mc, CCC-SLP Speech Language  Pathologist  Georgetta Haber 02/08/2020, 11:43 AM                 [facility] [address] [place], [place], [address] Phone: [phone]   Fax:  [phone]  Name: Olivia Powers MRN: [id number] Date of Birth: [birth year]

## 2020-03-24 ENCOUNTER — Encounter: Payer: Self-pay | Admitting: Internal Medicine

## 2020-05-08 ENCOUNTER — Other Ambulatory Visit: Payer: Self-pay

## 2020-05-08 ENCOUNTER — Ambulatory Visit: Payer: Medicare Other | Admitting: Internal Medicine

## 2020-05-08 ENCOUNTER — Encounter: Payer: Self-pay | Admitting: Internal Medicine

## 2020-05-08 VITALS — BP 149/76 | HR 71 | Temp 96.6°F | Ht 60.0 in | Wt 163.2 lb

## 2020-05-08 DIAGNOSIS — R1319 Other dysphagia: Secondary | ICD-10-CM

## 2020-05-08 DIAGNOSIS — R1084 Generalized abdominal pain: Secondary | ICD-10-CM

## 2020-05-08 DIAGNOSIS — K224 Dyskinesia of esophagus: Secondary | ICD-10-CM | POA: Diagnosis not present

## 2020-05-08 DIAGNOSIS — K5900 Constipation, unspecified: Secondary | ICD-10-CM | POA: Diagnosis not present

## 2020-05-08 DIAGNOSIS — R109 Unspecified abdominal pain: Secondary | ICD-10-CM | POA: Insufficient documentation

## 2020-05-08 MED ORDER — DICYCLOMINE HCL 10 MG PO CAPS: 10.0000 mg | ORAL_CAPSULE | Freq: Three times a day (TID) | ORAL | 5 refills | Status: DC

## 2020-05-08 NOTE — Progress Notes (Signed)
Referring Provider: [name], MD Primary Care Physician:  [name], MD Primary GI:  Dr. Abbey Chatters  Chief Complaint  Patient presents with  . Abdominal Pain    Mid abd  . Dysphagia    Food doesn't go down well  . loose stool    If doesn't take Miralax will get constipated    HPI:   Olivia Powers is a 80 y.o. female who presents to the clinic today for follow-up visit.  Previously seen for dysphagia.  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  Patient states her symptoms are relatively improved on the Protonix 40 mg daily though she still has issues intermittently.    Has been evaluated by speech therapy.  Currently doing okay from this standpoint.   Her main complaint for me is chronic constipation.    She has been dealing this for over 3 years now.  I started her on MiraLAX 1 capful daily after previous visit and she states this is caused her stools to be to "mushy."  Also notes abdominal pain in her periumbilical region.  Sometimes her pain is severe enough that she has to lay down.  Past Medical History:  Diagnosis Date  . Alopecia   . Arthritis   . Cancer ([place])    skin cancer of chest melanoma  . Diabetes mellitus without complication ([place])    taken off Metformin by PCP this year.  . Gout   . 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 Hypercholesteremia   . Hypertension     Past Surgical History:  Procedure Laterality Date  . ABDOMINAL HYSTERECTOMY    . BACK SURGERY     lower  . CATARACT EXTRACTION W/PHACO Left 02/11/2014   Procedure: CATARACT EXTRACTION PHACO AND INTRAOCULAR LENS PLACEMENT LEFT EYE;  Surgeon: Tonny Branch, MD;  [facility];  Service: Ophthalmology;  Laterality: Left;  CDE:4.93  . CATARACT EXTRACTION W/PHACO Right 03/11/2014   Procedure: CATARACT EXTRACTION PHACO AND INTRAOCULAR LENS PLACEMENT RIGHT EYE CDE=8.52;  Surgeon: Tonny Branch, MD;  [facility];  Service: Ophthalmology;  Laterality: Right;  . CERVICAL DISC SURGERY    . CESAREAN SECTION     x2  . CRANIECTOMY FOR EXCISION OF ACOUSTIC NEUROMA Left 1990, 1992  . ESOPHAGOGASTRODUODENOSCOPY N/A 04/22/2014   Procedure: ESOPHAGOGASTRODUODENOSCOPY (EGD);  Surgeon: [name], MD;  Location: [facility];  Service: Endoscopy;  Laterality: N/A;  . TOTAL HIP ARTHROPLASTY Left 07/06/2017   Procedure: LEFT TOTAL HIP ARTHROPLASTY ANTERIOR APPROACH;  Surgeon: Gaynelle Arabian, MD;  [facility];  Service: Orthopedics;  Laterality: Left;  Failed Spinal  . TOTAL HIP ARTHROPLASTY Right 08/23/2018   Procedure: TOTAL HIP ARTHROPLASTY ANTERIOR APPROACH;  Surgeon: Gaynelle Arabian, MD;  [facility];  Service: Orthopedics;  Laterality: Right;  180min  . TOTAL KNEE ARTHROPLASTY Bilateral 1990    Current Outpatient Medications  Medication Sig Dispense Refill  . acetaminophen (TYLENOL) 500 MG tablet Take 1,000 mg by mouth every 6 (six) hours as needed for moderate pain or headache.     . allopurinol (ZYLOPRIM) 100 MG tablet Take 100 mg by mouth daily.    . cilostazol (PLETAL) 100 MG tablet Take 100 mg by mouth 2 (two) times daily.    Marland Kitchen dicyclomine (BENTYL) 10 MG capsule Take 1 capsule (10 mg total) by mouth 4 (four) times daily -  before meals and at bedtime. [place]  capsule 5  . gabapentin (NEURONTIN) 300 MG capsule Take 300 mg by mouth at bedtime.    Marland Kitchen losartan (COZAAR) 25 MG tablet Take 25 mg by mouth daily.    . Melatonin 5 MG TABS Take 10 mg by mouth at bedtime.    . pantoprazole (PROTONIX) 40 MG tablet Take 40 mg by mouth daily.    . polyethylene glycol (MIRALAX / GLYCOLAX) packet Take 17 g by mouth daily.    . simvastatin (ZOCOR) 20 MG tablet Take 20 mg by mouth daily.     No current facility-administered medications for this visit.    Allergies as of 05/08/2020 - Review Complete 05/08/2020  Allergen Reaction Noted  . Codeine Nausea And Vomiting 02/07/2014  . Tramadol  06/30/2017     No family history on file.  Social History   Socioeconomic History  . Marital status: Widowed    Spouse name: Not on file  . Number of children: Not on file  . Years of education: Not on file  . Highest education level: Not on file  Occupational History  . Not on file  Tobacco Use  . Smoking status: Never Smoker  . Smokeless tobacco: Never Used  Vaping Use  . Vaping Use: Never used  Substance and Sexual Activity  . Alcohol use: No  . Drug use: No  . Sexual activity: Not Currently    Birth control/protection: Surgical  Other Topics Concern  . Not on file  Social History Narrative  . Not on file   Social Determinants of Health   Financial Resource Strain: Not on file  Food Insecurity: Not on file  Transportation Needs: Not on file  Physical Activity: Not on file  Stress: Not on file  Social Connections: Not on file    Subjective: Review of Systems  Constitutional: Negative for chills and fever.  HENT: Negative for congestion and hearing loss.   Eyes: Negative for blurred vision and double vision.  Respiratory: Negative for cough and shortness of breath.   Cardiovascular: Negative for chest pain and palpitations.  Gastrointestinal: Negative for abdominal pain, blood in stool, constipation, diarrhea, heartburn, melena and vomiting.  Genitourinary: Negative for dysuria and urgency.  Musculoskeletal: Negative for joint pain and myalgias.  Skin: Negative for itching and rash.  Neurological: Negative for dizziness and headaches.  Psychiatric/Behavioral: Negative for depression. The patient is not nervous/anxious.      Objective: BP (!) 149/76   Pulse 71   Temp (!) 96.6 F (35.9 C) (Temporal)   Ht 5' (1.524 m)   Wt 163 lb 3.2 oz (74 kg)   BMI 31.87 kg/m  Physical Exam Constitutional:      Appearance: Normal appearance.  HENT:     Head: Normocephalic and atraumatic.  Eyes:     Extraocular Movements: Extraocular movements intact.     Conjunctiva/sclera:  Conjunctivae normal.  Cardiovascular:     Rate and Rhythm: Normal rate and regular rhythm.  Pulmonary:     Effort: Pulmonary effort is normal.     Breath sounds: Normal breath sounds.  Abdominal:     General: Bowel sounds are normal.     Palpations: Abdomen is soft.  Musculoskeletal:        General: No swelling. Normal range of motion.     Cervical back: Normal range of motion and neck supple.  Skin:    General: Skin is warm and dry.     Coloration: Skin is not jaundiced.  Neurological:     General: No  focal deficit present.     Mental Status: She is alert and oriented to person, place, and time.  Psychiatric:        Mood and Affect: Mood normal.        Behavior: Behavior normal.      Assessment: *Chronic constipation *Abdominal pain  Plan: Constipation improved though sounds like the MiraLAX might have pushed her a little too far into the loose stool relm.  I have recommended that she decrease her MiraLAX to 1 capful every other day.  For her abdominal pain, I will send in dicyclomine to take up to 4 times a day as needed.  I counseled that this may make her drowsy and she understands.   Follow-up in 3 months or sooner if needed peer  05/08/2020 10:12 AM   Disclaimer: This note was dictated with voice recognition software. Similar sounding words can inadvertently be transcribed and may not be corrected upon review.

## 2020-05-08 NOTE — Patient Instructions (Signed)
For your constipation, I want you to decrease your MiraLAX to 1 capful every other day and see if this helps.  You can titrate further based on your bowel movements.  For your abdominal pain I am going to give you a new medication called dicyclomine.  This may cause some drowsiness.  You can take it up to 4 times a day for abdominal pain.  Follow-up in 3 months or sooner if needed.  At [facility] we value your feedback. You may receive a survey about your visit today. Please share your experience as we strive to create trusting relationships with our patients to provide genuine, compassionate, quality care.  We appreciate your understanding and patience as we review any laboratory studies, imaging, and other diagnostic tests that are ordered as we care for you. Our office policy is 5 business days for review of these results, and any emergent or urgent results are addressed in a timely manner for your best interest. If you do not hear from our office in 1 week, please contact us.   We also encourage the use of MyChart, which contains your medical information for your review as well. If you are not enrolled in this feature, an access code is on this after visit summary for your convenience. Thank you for allowing [place] to be involved in your care.  It was great to see you today!  I hope you have a great rest of your spring!!    Elon Alas. Abbey Chatters, D.O. Gastroenterology and Hepatology [facility]

## 2020-08-06 ENCOUNTER — Other Ambulatory Visit: Payer: Self-pay

## 2020-08-06 ENCOUNTER — Ambulatory Visit: Payer: Medicare Other | Admitting: Internal Medicine

## 2020-08-06 ENCOUNTER — Encounter: Payer: Self-pay | Admitting: Internal Medicine

## 2020-08-06 VITALS — BP 137/72 | HR 72 | Temp 97.5°F | Ht 60.0 in | Wt 162.0 lb

## 2020-08-06 DIAGNOSIS — R1319 Other dysphagia: Secondary | ICD-10-CM

## 2020-08-06 DIAGNOSIS — R1084 Generalized abdominal pain: Secondary | ICD-10-CM

## 2020-08-06 DIAGNOSIS — K5900 Constipation, unspecified: Secondary | ICD-10-CM

## 2020-08-06 DIAGNOSIS — K224 Dyskinesia of esophagus: Secondary | ICD-10-CM | POA: Diagnosis not present

## 2020-08-06 NOTE — Progress Notes (Signed)
Referring Provider: [name], MD Primary Care Physician:  [name], MD Primary GI:  Dr. Abbey Chatters  Chief Complaint  Patient presents with   Dysphagia    Goes down slow and stops in chest   Constipation    Takes Miralax-sometimes helps. Occ uses suppository   Abdominal Pain    occ    HPI:   Olivia Powers is a 80 y.o. female who presents to clinic today for follow-up visit.  She has multiple GI issues.  Previously seen for dysphagia and underwent esophagram which showed esophageal dysmotility as well as a large hiatal hernia.  Currently takes Protonix 40 mg daily.  Still has issues with dysphagia intermittently.  Has been evaluated by speech therapy in the past.  Also has chronic constipation which I started her on MiraLAX.  She states this keeps her symptoms well controlled.  No melena or hematochezia.  Does have chronic abdominal pain related to her constipation primarily in the periumbilical region.  Sometimes her pain is severe.  I previously started her on dicyclomine as needed she states this has helped immensely.  Takes it as needed.  Past Medical History:  Diagnosis Date   Alopecia    Arthritis    Cancer ([place])    skin cancer of chest melanoma   Diabetes mellitus without complication ([place])    taken off Metformin by PCP this year.   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   Hypercholesteremia    Hypertension     Past Surgical History:  Procedure Laterality Date   ABDOMINAL HYSTERECTOMY     BACK SURGERY     lower   CATARACT EXTRACTION W/PHACO Left 02/11/2014   Procedure: CATARACT EXTRACTION PHACO AND INTRAOCULAR LENS PLACEMENT LEFT EYE;  Surgeon: Tonny Branch, MD;  Location[facility];  Service: Ophthalmology;  Laterality: Left;  CDE:4.93   CATARACT EXTRACTION W/PHACO Right 03/11/2014   Procedure: CATARACT EXTRACTION PHACO AND INTRAOCULAR LENS PLACEMENT RIGHT EYE CDE=8.52;  Surgeon: Tonny Branch, MD;  Location: [facility];  Service:  Ophthalmology;  Laterality: Right;   CERVICAL DISC SURGERY     CESAREAN SECTION     x2   CRANIECTOMY FOR EXCISION OF ACOUSTIC NEUROMA Left 1990, 1992   ESOPHAGOGASTRODUODENOSCOPY N/A 04/22/2014   Procedure: ESOPHAGOGASTRODUODENOSCOPY (EGD);  Surgeon: [name], MD;  Location: [facility];  Service: Endoscopy;  Laterality: N/A;   TOTAL HIP ARTHROPLASTY Left 07/06/2017   Procedure: LEFT TOTAL HIP ARTHROPLASTY ANTERIOR APPROACH;  Surgeon: Gaynelle Arabian, MD;  [facility];  Service: Orthopedics;  Laterality: Left;  Failed Spinal   TOTAL HIP ARTHROPLASTY Right 08/23/2018   Procedure: TOTAL HIP ARTHROPLASTY ANTERIOR APPROACH;  Surgeon: Gaynelle Arabian, MD;  [facility];  Service: Orthopedics;  Laterality: Right;  182min   TOTAL KNEE ARTHROPLASTY Bilateral 1990    Current Outpatient Medications  Medication Sig Dispense Refill   acetaminophen (TYLENOL) 500 MG tablet Take 1,000 mg by mouth every 6 (six) hours as needed for moderate pain or headache.      allopurinol (ZYLOPRIM) 100 MG tablet Take 100 mg by mouth daily.     cilostazol (PLETAL) 100 MG tablet Take 100 mg by mouth 2 (two) times daily.     dicyclomine (BENTYL) 10 MG capsule Take 1 capsule (10 mg total) by mouth 4 (four) times daily -  before meals and at bedtime. (Patient taking differently: Take 10 mg by mouth as needed.) 120 capsule 5  gabapentin (NEURONTIN) 300 MG capsule Take 300 mg by mouth at bedtime.     losartan (COZAAR) 25 MG tablet Take 25 mg by mouth daily.     Melatonin 5 MG TABS Take 10 mg by mouth at bedtime.     pantoprazole (PROTONIX) 40 MG tablet Take 40 mg by mouth daily.     polyethylene glycol (MIRALAX / GLYCOLAX) packet Take 17 g by mouth daily.     simvastatin (ZOCOR) 20 MG tablet Take 20 mg by mouth daily.     No current facility-administered medications for this visit.    Allergies as of 08/06/2020 - Review Complete 08/06/2020  Allergen Reaction Noted   Codeine Nausea And Vomiting 02/07/2014    Tramadol  06/30/2017    No family history on file.  Social History   Socioeconomic History   Marital status: Widowed    Spouse name: Not on file   Number of children: Not on file   Years of education: Not on file   Highest education level: Not on file  Occupational History   Not on file  Tobacco Use   Smoking status: Never   Smokeless tobacco: Never  Vaping Use   Vaping Use: Never used  Substance and Sexual Activity   Alcohol use: No   Drug use: No   Sexual activity: Not Currently    Birth control/protection: Surgical  Other Topics Concern   Not on file  Social History Narrative   Not on file   Social Determinants of Health   Financial Resource Strain: Not on file  Food Insecurity: Not on file  Transportation Needs: Not on file  Physical Activity: Not on file  Stress: Not on file  Social Connections: Not on file    Subjective: Review of Systems  Constitutional:  Negative for chills and fever.  HENT:  Negative for congestion and hearing loss.   Eyes:  Negative for blurred vision and double vision.  Respiratory:  Negative for cough and shortness of breath.   Cardiovascular:  Negative for chest pain and palpitations.  Gastrointestinal:  Positive for constipation. Negative for abdominal pain, blood in stool, diarrhea, heartburn, melena and vomiting.  Genitourinary:  Negative for dysuria and urgency.  Musculoskeletal:  Negative for joint pain and myalgias.  Skin:  Negative for itching and rash.  Neurological:  Negative for dizziness and headaches.  Psychiatric/Behavioral:  Negative for depression. The patient is not nervous/anxious.     Objective: BP 137/72   Pulse 72   Temp (!) 97.5 F (36.4 C) (Temporal)   Ht 5' (1.524 m)   Wt 162 lb (73.5 kg)   BMI 31.64 kg/m  Physical Exam Constitutional:      Appearance: Normal appearance.  HENT:     Head: Normocephalic and atraumatic.  Eyes:     Extraocular Movements: Extraocular movements intact.      Conjunctiva/sclera: Conjunctivae normal.  Cardiovascular:     Rate and Rhythm: Normal rate and regular rhythm.  Pulmonary:     Effort: Pulmonary effort is normal.     Breath sounds: Normal breath sounds.  Abdominal:     General: Bowel sounds are normal.     Palpations: Abdomen is soft.  Musculoskeletal:        General: No swelling. Normal range of motion.     Cervical back: Normal range of motion and neck supple.  Skin:    General: Skin is warm and dry.     Coloration: Skin is not jaundiced.  Neurological:  General: No focal deficit present.     Mental Status: She is alert and oriented to person, place, and time.  Psychiatric:        Mood and Affect: Mood normal.        Behavior: Behavior normal.     Assessment: *Chronic constipation *Abdominal pain *Dysphagia-chronic    Plan: Constipation well controlled on as needed MiraLAX.  We will continue  Abdominal pain much improved on dicyclomine as needed.  We will continue.  Her dysphagia likely multifactorial with esophageal dysmotility as well as a large hiatal hernia.  Do not think she would benefit from esophageal dilation.  Do not think she would be a candidate for hiatal hernia repair given her age as well as the fact that she has esophageal dysmotility.    Patient to avoid tough textures. All meats should be chopped finely. Eat slowly, take small bites, chew thoroughly, and drink plenty of liquids throughout meals.   Patient follow-up in 6 months or sooner if needed   08/06/2020 2:28 PM   Disclaimer: This note was dictated with voice recognition software. Similar sounding words can inadvertently be transcribed and may not be corrected upon review.

## 2020-08-06 NOTE — Patient Instructions (Signed)
I am happy to hear you are doing better on the dicyclomine.  Continue to take this medication.  Continue MiraLAX for your constipation as needed.  You can take up to 2 capfuls daily.  Follow-up in 5 to 6 months or sooner if needed  At [facility] we value your feedback. You may receive a survey about your visit today. Please share your experience as we strive to create trusting relationships with our patients to provide genuine, compassionate, quality care.  We appreciate your understanding and patience as we review any laboratory studies, imaging, and other diagnostic tests that are ordered as we care for you. Our office policy is 5 business days for review of these results, and any emergent or urgent results are addressed in a timely manner for your best interest. If you do not hear from our office in 1 week, please contact us.   We also encourage the use of MyChart, which contains your medical information for your review as well. If you are not enrolled in this feature, an access code is on this after visit summary for your convenience. Thank you for allowing [place] to be involved in your care.  It was great to see you today!  I hope you have a great rest of your summer!!    Elon Alas. Abbey Chatters, D.O. Gastroenterology and Hepatology [facility]

## 2021-01-20 ENCOUNTER — Encounter: Payer: Self-pay | Admitting: Internal Medicine

## 2021-03-04 ENCOUNTER — Ambulatory Visit: Payer: Medicare Other | Admitting: Internal Medicine

## 2021-03-12 ENCOUNTER — Other Ambulatory Visit: Payer: Self-pay

## 2021-03-12 ENCOUNTER — Encounter: Payer: Self-pay | Admitting: Internal Medicine

## 2021-03-12 ENCOUNTER — Ambulatory Visit: Payer: Medicare Other | Admitting: Internal Medicine

## 2021-03-12 VITALS — BP 136/61 | HR 70 | Temp 97.1°F | Ht 59.0 in | Wt 160.6 lb

## 2021-03-12 DIAGNOSIS — R1013 Epigastric pain: Secondary | ICD-10-CM

## 2021-03-12 DIAGNOSIS — R1319 Other dysphagia: Secondary | ICD-10-CM | POA: Diagnosis not present

## 2021-03-12 DIAGNOSIS — K219 Gastro-esophageal reflux disease without esophagitis: Secondary | ICD-10-CM | POA: Diagnosis not present

## 2021-03-12 MED ORDER — DICYCLOMINE HCL 10 MG PO CAPS: 10.0000 mg | ORAL_CAPSULE | Freq: Two times a day (BID) | ORAL | 5 refills | Status: DC

## 2021-03-12 NOTE — Progress Notes (Signed)
Referring Provider: [name], MD Primary Care Physician:  [name], MD Primary GI:  Dr. Abbey Chatters  Chief Complaint  Patient presents with   epigastric pain   Dysphagia    sometimes   Gastroesophageal Reflux    HPI:   Olivia Powers is a 81 y.o. female who presents to clinic today for follow-up visit.  She has multiple GI issues.  Previously seen for dysphagia and underwent esophagram which showed esophageal dysmotility as well as a large hiatal hernia.  Currently takes Protonix 40 mg daily.  Still has issues with dysphagia intermittently.  Has been evaluated by speech therapy in the past.  Also has chronic constipation which I started her on MiraLAX daily.  She states this keeps her symptoms well controlled.  No melena or hematochezia.  Does have chronic abdominal pain related to her constipation primarily in the periumbilical region.  Sometimes her pain is severe.  I previously started her on dicyclomine as needed she states this has helped immensely.  Takes it as needed. Requesting refill today  Past Medical History:  Diagnosis Date   Alopecia    Arthritis    Cancer ([place])    skin cancer of chest melanoma   Diabetes mellitus without complication ([place])    taken off Metformin by PCP this year.   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   Hypercholesteremia    Hypertension     Past Surgical History:  Procedure Laterality Date   ABDOMINAL HYSTERECTOMY     BACK SURGERY     lower   CATARACT EXTRACTION W/PHACO Left 02/11/2014   Procedure: CATARACT EXTRACTION PHACO AND INTRAOCULAR LENS PLACEMENT LEFT EYE;  Surgeon: Tonny Branch, MD;  Location: [facility];  Service: Ophthalmology;  Laterality: Left;  CDE:4.93   CATARACT EXTRACTION W/PHACO Right 03/11/2014   Procedure: CATARACT EXTRACTION PHACO AND INTRAOCULAR LENS PLACEMENT RIGHT EYE CDE=8.52;  Surgeon: Tonny Branch, MD;  Location: [facility];  Service: Ophthalmology;  Laterality: Right;   CERVICAL DISC  SURGERY     CESAREAN SECTION     x2   CRANIECTOMY FOR EXCISION OF ACOUSTIC NEUROMA Left 1990, 1992   ESOPHAGOGASTRODUODENOSCOPY N/A 04/22/2014   Procedure: ESOPHAGOGASTRODUODENOSCOPY (EGD);  Surgeon: [name], MD;  Location: [place];  Service: Endoscopy;  Laterality: N/A;   TOTAL HIP ARTHROPLASTY Left 07/06/2017   Procedure: LEFT TOTAL HIP ARTHROPLASTY ANTERIOR APPROACH;  Surgeon: Gaynelle Arabian, MD;  Location: [facility];  Service: Orthopedics;  Laterality: Left;  Failed Spinal   TOTAL HIP ARTHROPLASTY Right 08/23/2018   Procedure: TOTAL HIP ARTHROPLASTY ANTERIOR APPROACH;  Surgeon: Gaynelle Arabian, MD;  Location: [facility];  Service: Orthopedics;  Laterality: Right;  153min   TOTAL KNEE ARTHROPLASTY Bilateral 1990    Current Outpatient Medications  Medication Sig Dispense Refill   acetaminophen (TYLENOL) 500 MG tablet Take 1,000 mg by mouth every 6 (six) hours as needed for moderate pain or headache.      allopurinol (ZYLOPRIM) 100 MG tablet Take 100 mg by mouth daily.     dicyclomine (BENTYL) 10 MG capsule Take 1 capsule (10 mg total) by mouth 4 (four) times daily -  before meals and at bedtime. (Patient taking differently: Take 10 mg by mouth as needed.) 120 capsule 5   gabapentin (NEURONTIN) 300 MG capsule Take 300 mg by mouth 2 (two) times daily. Occ takes more than twice a day     losartan (COZAAR) 25 MG tablet Take  25 mg by mouth daily.     Melatonin 5 MG TABS Take 10 mg by mouth at bedtime.     pantoprazole (PROTONIX) 40 MG tablet Take 40 mg by mouth daily.     polyethylene glycol (MIRALAX / GLYCOLAX) packet Take 17 g by mouth daily.     simvastatin (ZOCOR) 20 MG tablet Take 20 mg by mouth daily.     cilostazol (PLETAL) 100 MG tablet Take 100 mg by mouth 2 (two) times daily. (Patient not taking: Reported on 03/12/2021)     No current facility-administered medications for this visit.    Allergies as of 03/12/2021 - Review Complete 03/12/2021  Allergen Reaction Noted   Codeine  Nausea And Vomiting 02/07/2014   Tramadol  06/30/2017    No family history on file.  Social History   Socioeconomic History   Marital status: Widowed    Spouse name: Not on file   Number of children: Not on file   Years of education: Not on file   Highest education level: Not on file  Occupational History   Not on file  Tobacco Use   Smoking status: Never   Smokeless tobacco: Never  Vaping Use   Vaping Use: Never used  Substance and Sexual Activity   Alcohol use: No   Drug use: No   Sexual activity: Not Currently    Birth control/protection: Surgical  Other Topics Concern   Not on file  Social History Narrative   Not on file   Social Determinants of Health   Financial Resource Strain: Not on file  Food Insecurity: Not on file  Transportation Needs: Not on file  Physical Activity: Not on file  Stress: Not on file  Social Connections: Not on file    Subjective: Review of Systems  Constitutional:  Negative for chills and fever.  HENT:  Negative for congestion and hearing loss.   Eyes:  Negative for blurred vision and double vision.  Respiratory:  Negative for cough and shortness of breath.   Cardiovascular:  Negative for chest pain and palpitations.  Gastrointestinal:  Positive for constipation. Negative for abdominal pain, blood in stool, diarrhea, heartburn, melena and vomiting.  Genitourinary:  Negative for dysuria and urgency.  Musculoskeletal:  Negative for joint pain and myalgias.  Skin:  Negative for itching and rash.  Neurological:  Negative for dizziness and headaches.  Psychiatric/Behavioral:  Negative for depression. The patient is not nervous/anxious.     Objective: BP 136/61    Pulse 70    Temp (!) 97.1 F (36.2 C) (Temporal)    Ht 4\' 11"  (1.499 m)    Wt 160 lb 9.6 oz (72.8 kg)    BMI 32.44 kg/m  Physical Exam Constitutional:      Appearance: Normal appearance.  HENT:     Head: Normocephalic and atraumatic.  Eyes:     Extraocular Movements:  Extraocular movements intact.     Conjunctiva/sclera: Conjunctivae normal.  Cardiovascular:     Rate and Rhythm: Normal rate and regular rhythm.  Pulmonary:     Effort: Pulmonary effort is normal.     Breath sounds: Normal breath sounds.  Abdominal:     General: Bowel sounds are normal.     Palpations: Abdomen is soft.  Musculoskeletal:        General: No swelling. Normal range of motion.     Cervical back: Normal range of motion and neck supple.  Skin:    General: Skin is warm and dry.  Coloration: Skin is not jaundiced.  Neurological:     General: No focal deficit present.     Mental Status: She is alert and oriented to person, place, and time.  Psychiatric:        Mood and Affect: Mood normal.        Behavior: Behavior normal.     Assessment: *Chronic constipation *Abdominal pain *Dysphagia-chronic    Plan: Constipation well controlled on as needed MiraLAX.  We will continue  Abdominal pain much improved on dicyclomine as needed.  Refilled today  Her dysphagia likely multifactorial with esophageal dysmotility as well as a large hiatal hernia.  Do not think she would be a candidate for hiatal hernia repair given her age as well as the fact that she has esophageal dysmotility.    Offered EGD to further evaluate with potential esophageal dilation and patient would like to hold off for now.  If she would like to have this procedure done, then she will call our office and we will set this up.  Patient to avoid tough textures. All meats should be chopped finely. Eat slowly, take small bites, chew thoroughly, and drink plenty of liquids throughout meals.   Patient follow-up in 6 months or sooner if needed   03/12/2021 2:31 PM   Disclaimer: This note was dictated with voice recognition software. Similar sounding words can inadvertently be transcribed and may not be corrected upon review.

## 2021-03-12 NOTE — Patient Instructions (Signed)
I am happy to hear you are doing well.  Continue on pantoprazole daily for your chronic reflux.  Continue dicyclomine as needed for abdominal pain.  Continue MiraLAX daily for your constipation.  If you would like to undergo EGD to further evaluate your swallowing with potential esophageal dilation, then let us know and we will get you scheduled.  Otherwise follow-up in 6 months.  It was very nice seeing you again today.  Dr. Abbey Chatters  At [facility] we value your feedback. You may receive a survey about your visit today. Please share your experience as we strive to create trusting relationships with our patients to provide genuine, compassionate, quality care.  We appreciate your understanding and patience as we review any laboratory studies, imaging, and other diagnostic tests that are ordered as we care for you. Our office policy is 5 business days for review of these results, and any emergent or urgent results are addressed in a timely manner for your best interest. If you do not hear from our office in 1 week, please contact us.   We also encourage the use of MyChart, which contains your medical information for your review as well. If you are not enrolled in this feature, an access code is on this after visit summary for your convenience. Thank you for allowing [place] to be involved in your care.  It was great to see you today!  I hope you have a great rest of your Winter!    Elon Alas. Abbey Chatters, D.O. Gastroenterology and Hepatology [facility]

## 2021-08-17 ENCOUNTER — Encounter: Payer: Self-pay | Admitting: Internal Medicine

## 2021-10-07 ENCOUNTER — Ambulatory Visit (INDEPENDENT_AMBULATORY_CARE_PROVIDER_SITE_OTHER): Payer: Medicare Other | Admitting: Gastroenterology

## 2021-10-07 ENCOUNTER — Encounter: Payer: Self-pay | Admitting: Gastroenterology

## 2021-10-07 VITALS — BP 153/88 | HR 74 | Temp 98.0°F | Ht <= 58 in | Wt 160.4 lb

## 2021-10-07 DIAGNOSIS — K224 Dyskinesia of esophagus: Secondary | ICD-10-CM | POA: Diagnosis not present

## 2021-10-07 DIAGNOSIS — R1033 Periumbilical pain: Secondary | ICD-10-CM | POA: Diagnosis not present

## 2021-10-07 DIAGNOSIS — K5900 Constipation, unspecified: Secondary | ICD-10-CM

## 2021-10-07 MED ORDER — KETOCONAZOLE 2 % EX CREA: TOPICAL_CREAM | CUTANEOUS | 0 refills | Status: DC

## 2021-10-07 MED ORDER — DICYCLOMINE HCL 10 MG PO CAPS: 10.0000 mg | ORAL_CAPSULE | Freq: Two times a day (BID) | ORAL | 5 refills | Status: DC | PRN

## 2021-10-07 NOTE — Progress Notes (Signed)
GI Office Note    Referring Provider: [name], MD Primary Care Physician:  [name], MD  Primary Gastroenterologist: Elon Alas. Abbey Chatters, DO   Chief Complaint   Chief Complaint  Patient presents with   Follow-up    Pt is having issues with feeling like food is getting stuck at the top of her chest.     History of Present Illness   Olivia Powers is a 81 y.o. female presenting today for follow up of dysphagia. She was last seen in 03/2021.  Previously seen for dysphagia and underwent esophagram which showed esophageal dysmotility as well as a large hiatal hernia.  Has also been evaluated by speech therapy in 2021, recommended mechanical soft, thin liquids, taking home medications with liquid.  Ongoing issues with dysphagia.  Feels like food and medications stick in her mid chest.  Certain foods she has learned to avoid.  Takes medications only 1 at a time.  Denies any heartburn.  Continues pantoprazole 40 mg daily.  Sometimes utilizes dicyclomine when she feels like food sitting in her chest, at times she feels like this gives her relief.  She was offered an upper endoscopy with esophageal dilation several occasions but has been reluctant so far.  It is felt that her large hiatal hernia and esophageal dysmotility is likely playing a large role but cannot rule esophageal stricture or other etiologies contributing.  Her last endoscopy was in 2016.  Taking MiraLAX every morning.  Typically has a bowel movement once per day but occasionally skips a day.  She is started using an extra dose on these days.  No melena or rectal bleeding.  She notes off and on for the past 1 year that she has irritation at the umbilicus.  Sometimes gets red and drains.  She has been using over-the-counter anti-chafing cream without relief.  Her belt/pant line aggravates it.  No melena or rectal bleeding.  Continues to have intermittent abdominal discomfort, dicyclomine seems to help.    Medications    Current Outpatient Medications  Medication Sig Dispense Refill   acetaminophen (TYLENOL) 500 MG tablet Take 1,000 mg by mouth every 6 (six) hours as needed for moderate pain or headache.      allopurinol (ZYLOPRIM) 100 MG tablet Take 100 mg by mouth daily.     dicyclomine (BENTYL) 10 MG capsule Take 1 capsule (10 mg total) by mouth 2 (two) times daily. 60 capsule 5   gabapentin (NEURONTIN) 300 MG capsule Take 300 mg by mouth 2 (two) times daily. Occ takes more than twice a day     losartan (COZAAR) 25 MG tablet Take 25 mg by mouth daily.     Melatonin 5 MG TABS Take 10 mg by mouth at bedtime.     pantoprazole (PROTONIX) 40 MG tablet Take 40 mg by mouth daily.     polyethylene glycol (MIRALAX / GLYCOLAX) packet Take 17 g by mouth daily.     simvastatin (ZOCOR) 20 MG tablet Take 20 mg by mouth daily.     No current facility-administered medications for this visit.    Allergies   Allergies as of 10/07/2021 - Review Complete 10/07/2021  Allergen Reaction Noted   Codeine Nausea And Vomiting 02/07/2014   Tramadol  06/30/2017      Review of Systems   General: Negative for anorexia, weight loss, fever, chills, fatigue, weakness. ENT: Negative for hoarseness,  nasal congestion.  See HPI CV: Negative for chest pain, angina, palpitations, dyspnea on exertion, peripheral  edema.  Respiratory: Negative for dyspnea at rest, dyspnea on exertion, cough, sputum, wheezing.  GI: See history of present illness. GU:  Negative for dysuria, hematuria, urinary incontinence, urinary frequency, nocturnal urination.  Endo: Negative for unusual weight change.     Physical Exam   BP (!) 153/88 (BP Location: Left Arm, Patient Position: Sitting, Cuff Size: Normal)   Pulse 74   Temp 98 F (36.7 C) (Temporal)   Ht '4\' 8"'$  (1.422 m)   Wt 160 lb 6.4 oz (72.8 kg)   SpO2 99%   BMI 35.96 kg/m    General: Well-nourished, well-developed in no acute distress.  Eyes: No icterus. Mouth: Oropharyngeal mucosa  moist and pink , no lesions erythema or exudate. Lungs: Clear to auscultation bilaterally.  Heart: Regular rate and rhythm, no murmurs rubs or gallops.  Abdomen: Bowel sounds are normal,  nondistended, no hepatosplenomegaly or masses,  no abdominal bruits or hernia , no rebound or guarding. Mild erythema at umbilicus without drainage. Mild tenderness in epigastric region. Rectas diastasis. Rectal: not Performed Extremities: No lower extremity edema. No clubbing or deformities. Neuro: Alert and oriented x 4   Skin: Warm and dry, no jaundice.   Psych: Alert and cooperative, normal mood and affect.  Labs   none  Imaging Studies   No results found.  Assessment   Constipation: doing well on Miralax daily, requiring second dose on occasion.  Abdominal pain: improved on dicyclomine, uses as needed. Refills provided.   Dysphagia: mulitfactorial in the setting of esophageal dysmotility and large hiatal hernia.  Last upper endoscopy in March 2016 for GI bleeding.  She has been reluctant to pursue upper endoscopy with esophageal dilation.  Discussed with her at length today, would recommend upper endoscopy to evaluate progressive symptoms and consider esophageal dilation.  Dilation may improve her symptoms but not likely to resolve 100%.  She may have developed a superimposed esophageal stricture.  Umbilical rash: No obvious drainage.  Looks like yeast/fungal related.  PLAN   Continue pantoprazole 40 mg daily before breakfast. Continue dicyclomine 10 mg twice daily as needed for abdominal pain/spasms. Continue MiraLAX 1-2 times daily for constipation. Ketoconazole cream twice daily for 10 days, continue with least 48 hours after rash resolves.  Call with ongoing symptoms. Recommended upper endoscopy, patient wants to think about it.  She will call let us know if she wants to pursue.  Otherwise we will have her come back in 6 weeks per her request.  She also prefers to see Dr. Abbey Chatters and we  have made notation to make future appointments with him.   [name]. Bobby Rumpf, [place], [place] [facility]

## 2021-10-07 NOTE — Patient Instructions (Signed)
Continue pantoprazole '40mg'$  daily before breakfast for your esophagus. Continue dicyclomine '10mg'$  twice daily as needed for abdominal pain/spasms. I sent in new RX. Start ketoconazole cream twice daily for 10 days or at least 2 days after your rash resolves. Apply to your belly button. If does not improve, let me know. Return to the office to see Dr. Abbey Chatters in 6 weeks.

## 2021-11-25 ENCOUNTER — Encounter: Payer: Self-pay | Admitting: Internal Medicine

## 2021-11-25 ENCOUNTER — Ambulatory Visit (INDEPENDENT_AMBULATORY_CARE_PROVIDER_SITE_OTHER): Payer: Medicare Other | Admitting: Internal Medicine

## 2021-11-25 VITALS — BP 128/69 | HR 77 | Temp 98.1°F | Ht <= 58 in | Wt 160.7 lb

## 2021-11-25 DIAGNOSIS — K625 Hemorrhage of anus and rectum: Secondary | ICD-10-CM

## 2021-11-25 DIAGNOSIS — K219 Gastro-esophageal reflux disease without esophagitis: Secondary | ICD-10-CM

## 2021-11-25 DIAGNOSIS — K5900 Constipation, unspecified: Secondary | ICD-10-CM | POA: Diagnosis not present

## 2021-11-25 DIAGNOSIS — R1319 Other dysphagia: Secondary | ICD-10-CM | POA: Diagnosis not present

## 2021-11-25 NOTE — Progress Notes (Signed)
Referring Provider: [name], MD Primary Care Physician:  [name], MD Primary GI:  Dr. Abbey Chatters  Chief Complaint  Patient presents with   Gastroesophageal Reflux    Follow up. Watching what she eats so she is not getting choked as much. Has abdominal pain in mid abdomen when eating sometimes. Change in bowel habit about 1 -2 months ago. Reports stools are now loose. Noticed bright red blood in stool one time about 2 weeks ago.     HPI:   Olivia Powers is a 81 y.o. female who presents to clinic today for follow-up visit.  She has multiple GI issues.  Previously seen for dysphagia and underwent esophagram which showed esophageal dysmotility as well as a large hiatal hernia.  Currently takes Protonix 40 mg daily.  Still has issues with dysphagia intermittently.  Has been evaluated by speech therapy in the past.  Also has chronic constipation which I started her on MiraLAX daily.  She states this keeps her symptoms well controlled.  Does note some increased loose stools as of late.  1 episode of rectal bleeding.  Last colonoscopy she states was 13 years ago WNL.    Does have chronic abdominal pain related to her constipation primarily in the periumbilical region.  Sometimes her pain is severe.  I previously started her on dicyclomine as needed she states this has helped immensely.  Takes it as needed.   Past Medical History:  Diagnosis Date   Alopecia    Arthritis    Cancer ([place])    skin cancer of chest melanoma   Diabetes mellitus without complication ([place])    taken off Metformin by PCP this year.   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   Hypercholesteremia    Hypertension     Past Surgical History:  Procedure Laterality Date   ABDOMINAL HYSTERECTOMY     BACK SURGERY     lower   CATARACT EXTRACTION W/PHACO Left 02/11/2014   Procedure: CATARACT EXTRACTION PHACO AND INTRAOCULAR LENS PLACEMENT LEFT EYE;  Surgeon: Tonny Branch, MD;   Location: [facility];  Service: Ophthalmology;  Laterality: Left;  CDE:4.93   CATARACT EXTRACTION W/PHACO Right 03/11/2014   Procedure: CATARACT EXTRACTION PHACO AND INTRAOCULAR LENS PLACEMENT RIGHT EYE CDE=8.52;  Surgeon: Tonny Branch, MD;  Location: [facility];  Service: Ophthalmology;  Laterality: Right;   CERVICAL DISC SURGERY     CESAREAN SECTION     x2   CRANIECTOMY FOR EXCISION OF ACOUSTIC NEUROMA Left 1990, 1992   ESOPHAGOGASTRODUODENOSCOPY N/A 04/22/2014   Procedure: ESOPHAGOGASTRODUODENOSCOPY (EGD);  Surgeon: [name], MD;  Location: [facility];  Service: Endoscopy;  Laterality: N/A;   TOTAL HIP ARTHROPLASTY Left 07/06/2017   Procedure: LEFT TOTAL HIP ARTHROPLASTY ANTERIOR APPROACH;  Surgeon: Gaynelle Arabian, MD;  Location: [facility];  Service: Orthopedics;  Laterality: Left;  Failed Spinal   TOTAL HIP ARTHROPLASTY Right 08/23/2018   Procedure: TOTAL HIP ARTHROPLASTY ANTERIOR APPROACH;  Surgeon: Gaynelle Arabian, MD;  Location: [facility];  Service: Orthopedics;  Laterality: Right;  165mn   TOTAL KNEE ARTHROPLASTY Bilateral 1990    Current Outpatient Medications  Medication Sig Dispense Refill   acetaminophen (TYLENOL) 500 MG tablet Take 1,000 mg by mouth every 6 (six) hours as needed for moderate pain or headache.      allopurinol (ZYLOPRIM) 100 MG tablet Take 100 mg by mouth daily.     dicyclomine (BENTYL) 10 MG capsule Take 1  capsule (10 mg total) by mouth 2 (two) times daily as needed. For abdominal pain or spasms. 60 capsule 5   gabapentin (NEURONTIN) 300 MG capsule Take 300 mg by mouth 2 (two) times daily. Occ takes more than twice a day     ketoconazole (NIZORAL) 2 % cream Apply twice daily to irritated belly button for 10 days or at least 2 days after rash resolves. 30 g 0   losartan (COZAAR) 25 MG tablet Take 25 mg by mouth daily.     Melatonin 5 MG TABS Take 10 mg by mouth at bedtime.     pantoprazole (PROTONIX) 40 MG tablet Take 40 mg by mouth daily.     polyethylene glycol (MIRALAX /  GLYCOLAX) packet Take 17 g by mouth daily.     simvastatin (ZOCOR) 20 MG tablet Take 20 mg by mouth daily.     No current facility-administered medications for this visit.    Allergies as of 11/25/2021 - Review Complete 11/25/2021  Allergen Reaction Noted   Codeine Nausea And Vomiting 02/07/2014   Tramadol  06/30/2017    No family history on file.  Social History   Socioeconomic History   Marital status: Widowed    Spouse name: Not on file   Number of children: Not on file   Years of education: Not on file   Highest education level: Not on file  Occupational History   Not on file  Tobacco Use   Smoking status: Never    Passive exposure: Past   Smokeless tobacco: Never  Vaping Use   Vaping Use: Never used  Substance and Sexual Activity   Alcohol use: No   Drug use: No   Sexual activity: Not Currently    Birth control/protection: Surgical  Other Topics Concern   Not on file  Social History Narrative   Not on file   Social Determinants of Health   Financial Resource Strain: Not on file  Food Insecurity: Not on file  Transportation Needs: Not on file  Physical Activity: Not on file  Stress: Not on file  Social Connections: Not on file    Subjective: Review of Systems  Constitutional:  Negative for chills and fever.  HENT:  Negative for congestion and hearing loss.   Eyes:  Negative for blurred vision and double vision.  Respiratory:  Negative for cough and shortness of breath.   Cardiovascular:  Negative for chest pain and palpitations.  Gastrointestinal:  Positive for constipation. Negative for abdominal pain, blood in stool, diarrhea, heartburn, melena and vomiting.  Genitourinary:  Negative for dysuria and urgency.  Musculoskeletal:  Negative for joint pain and myalgias.  Skin:  Negative for itching and rash.  Neurological:  Negative for dizziness and headaches.  Psychiatric/Behavioral:  Negative for depression. The patient is not nervous/anxious.       Objective: BP 128/69 (BP Location: Right Arm, Patient Position: Sitting, Cuff Size: Large)   Pulse 77   Temp 98.1 F (36.7 C) (Oral)   Ht '4\' 8"'$  (1.422 m)   Wt 160 lb 11.2 oz (72.9 kg)   BMI 36.03 kg/m  Physical Exam Constitutional:      Appearance: Normal appearance.  HENT:     Head: Normocephalic and atraumatic.  Eyes:     Extraocular Movements: Extraocular movements intact.     Conjunctiva/sclera: Conjunctivae normal.  Cardiovascular:     Rate and Rhythm: Normal rate and regular rhythm.  Pulmonary:     Effort: Pulmonary effort is normal.  Breath sounds: Normal breath sounds.  Abdominal:     General: Bowel sounds are normal.     Palpations: Abdomen is soft.  Musculoskeletal:        General: No swelling. Normal range of motion.     Cervical back: Normal range of motion and neck supple.  Skin:    General: Skin is warm and dry.     Coloration: Skin is not jaundiced.  Neurological:     General: No focal deficit present.     Mental Status: She is alert and oriented to person, place, and time.  Psychiatric:        Mood and Affect: Mood normal.        Behavior: Behavior normal.      Assessment: *Chronic constipation *Abdominal pain *Dysphagia-chronic *GERD *Rectal bleeding-1 episode    Plan: Constipation well controlled on as daily MiraLAX.  Does note increase loose stools as of late.  Possible overflow diarrhea versus MiraLAX use.  Recommend she titrate dosing of her MiraLAX and see how this does.  Abdominal pain much improved on dicyclomine as needed.  We will continue.  Her dysphagia likely multifactorial with esophageal dysmotility as well as a large hiatal hernia.  Do not think she would be a candidate for hiatal hernia repair given her age as well as the fact that she has esophageal dysmotility.    Offered EGD to further evaluate with potential esophageal dilation and patient would like to hold off for now.  If she would like to have this procedure  done, then she will call our office and we will set this up.  Chronic GERD well-controlled on pantoprazole daily.  We will continue.  Patient to avoid tough textures. All meats should be chopped finely. Eat slowly, take small bites, chew thoroughly, and drink plenty of liquids throughout meals.   1 episode of rectal bleeding likely due to hemorrhoids.  Offered colonoscopy to further evaluate and she would like to hold off.  Follow-up in 3 months.   11/25/2021 11:06 AM   Disclaimer: This note was dictated with voice recognition software. Similar sounding words can inadvertently be transcribed and may not be corrected upon review.

## 2021-11-25 NOTE — Patient Instructions (Addendum)
For your chronic constipation as well as loose stools, this could be overflow diarrhea in the setting of chronic constipation.  Also could be the [employer] working too well.  Recommend taking MiraLAX 2 capfuls a day and see how you do.  If worsening loose stools, then I would back down to 1 capful MiraLAX every 2 to 3 days.  Continue to monitor for any further rectal bleeding.  Continue dicyclomine as needed for abdominal pain.  Continue pantoprazole for your chronic acid reflux.  Follow-up in 3 months.  It was very nice seeing you again today.  Dr. Abbey Chatters

## 2022-02-25 ENCOUNTER — Ambulatory Visit: Payer: Medicare Other | Admitting: Internal Medicine

## 2022-03-03 ENCOUNTER — Ambulatory Visit (INDEPENDENT_AMBULATORY_CARE_PROVIDER_SITE_OTHER): Payer: Medicare Other | Admitting: Internal Medicine

## 2022-03-03 ENCOUNTER — Encounter: Payer: Self-pay | Admitting: Internal Medicine

## 2022-03-03 VITALS — BP 114/75 | HR 69 | Temp 97.9°F | Ht <= 58 in | Wt 156.1 lb

## 2022-03-03 DIAGNOSIS — K224 Dyskinesia of esophagus: Secondary | ICD-10-CM | POA: Diagnosis not present

## 2022-03-03 DIAGNOSIS — K219 Gastro-esophageal reflux disease without esophagitis: Secondary | ICD-10-CM

## 2022-03-03 DIAGNOSIS — R1319 Other dysphagia: Secondary | ICD-10-CM

## 2022-03-03 DIAGNOSIS — R1084 Generalized abdominal pain: Secondary | ICD-10-CM

## 2022-03-03 DIAGNOSIS — K5904 Chronic idiopathic constipation: Secondary | ICD-10-CM

## 2022-03-03 MED ORDER — KETOCONAZOLE 2 % EX CREA: TOPICAL_CREAM | CUTANEOUS | 3 refills | Status: AC

## 2022-03-03 MED ORDER — DICYCLOMINE HCL 10 MG PO CAPS: 10.0000 mg | ORAL_CAPSULE | Freq: Two times a day (BID) | ORAL | 11 refills | Status: DC | PRN

## 2022-03-03 NOTE — Progress Notes (Signed)
Referring Provider: [name], MD Primary Care Physician:  [name], MD Primary GI:  Dr. Abbey Chatters  Chief Complaint  Patient presents with   Constipation    3 month follow up on constipation, dysphagia and hemorrhoids. Uses prep h and asking if she can try something else for hemorrhoids. Asking for refill on ketoconazole cream for yeast rash on naval.     HPI:   Olivia Powers is a 82 y.o. female who presents to clinic today for follow-up visit.  She has multiple GI issues.  Previously seen for dysphagia and underwent esophagram which showed esophageal dysmotility as well as a large hiatal hernia.  Currently takes Protonix 40 mg daily.  Still has issues with dysphagia intermittently.  Has been evaluated by speech therapy in the past.  Also has chronic constipation which I started her on MiraLAX daily.  She states this keeps her symptoms well controlled. Last colonoscopy she states was 13 years ago WNL.    Does have chronic abdominal pain related to her constipation primarily in the periumbilical region.  Sometimes her pain is moderate.  I previously started her on dicyclomine as needed she states this has helped immensely.  Takes it as needed.   Past Medical History:  Diagnosis Date   Alopecia    Arthritis    Cancer ([place])    skin cancer of chest melanoma   Diabetes mellitus without complication ([place])    taken off Metformin by PCP this year.   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   Hypercholesteremia    Hypertension     Past Surgical History:  Procedure Laterality Date   ABDOMINAL HYSTERECTOMY     BACK SURGERY     lower   CATARACT EXTRACTION W/PHACO Left 02/11/2014   Procedure: CATARACT EXTRACTION PHACO AND INTRAOCULAR LENS PLACEMENT LEFT EYE;  Surgeon: Tonny Branch, MD;  Location: [facility];  Service: Ophthalmology;  Laterality: Left;  CDE:4.93   CATARACT EXTRACTION W/PHACO Right 03/11/2014   Procedure: CATARACT EXTRACTION PHACO AND  INTRAOCULAR LENS PLACEMENT RIGHT EYE CDE=8.52;  Surgeon: Tonny Branch, MD;  Location: [facility];  Service: Ophthalmology;  Laterality: Right;   CERVICAL DISC SURGERY     CESAREAN SECTION     x2   CRANIECTOMY FOR EXCISION OF ACOUSTIC NEUROMA Left 1990, 1992   ESOPHAGOGASTRODUODENOSCOPY N/A 04/22/2014   Procedure: ESOPHAGOGASTRODUODENOSCOPY (EGD);  Surgeon: [name], MD;  Location: [facility];  Service: Endoscopy;  Laterality: N/A;   TOTAL HIP ARTHROPLASTY Left 07/06/2017   Procedure: LEFT TOTAL HIP ARTHROPLASTY ANTERIOR APPROACH;  Surgeon: Gaynelle Arabian, MD;  [facility];  Service: Orthopedics;  Laterality: Left;  Failed Spinal   TOTAL HIP ARTHROPLASTY Right 08/23/2018   Procedure: TOTAL HIP ARTHROPLASTY ANTERIOR APPROACH;  Surgeon: Gaynelle Arabian, MD;  Location: [facility];  Service: Orthopedics;  Laterality: Right;  152mn   TOTAL KNEE ARTHROPLASTY Bilateral 1990    Current Outpatient Medications  Medication Sig Dispense Refill   acetaminophen (TYLENOL) 500 MG tablet Take 1,000 mg by mouth every 6 (six) hours as needed for moderate pain or headache.      allopurinol (ZYLOPRIM) 100 MG tablet Take 100 mg by mouth daily.     dicyclomine (BENTYL) 10 MG capsule Take 1 capsule (10 mg total) by mouth 2 (two) times daily as needed. For abdominal pain or spasms. 60 capsule 5   gabapentin (NEURONTIN) 300 MG capsule Take 300 mg by mouth 2 (two)  times daily. Occ takes more than twice a day     ketoconazole (NIZORAL) 2 % cream Apply twice daily to irritated belly button for 10 days or at least 2 days after rash resolves. 30 g 0   losartan (COZAAR) 25 MG tablet Take 25 mg by mouth daily.     Melatonin 5 MG TABS Take 10 mg by mouth at bedtime.     pantoprazole (PROTONIX) 40 MG tablet Take 40 mg by mouth daily.     polyethylene glycol (MIRALAX / GLYCOLAX) packet Take 17 g by mouth daily.     simvastatin (ZOCOR) 20 MG tablet Take 20 mg by mouth daily.     No current facility-administered medications for  this visit.    Allergies as of 03/03/2022 - Review Complete 03/03/2022  Allergen Reaction Noted   Codeine Nausea And Vomiting 02/07/2014   Tramadol  06/30/2017    No family history on file.  Social History   Socioeconomic History   Marital status: Widowed    Spouse name: Not on file   Number of children: Not on file   Years of education: Not on file   Highest education level: Not on file  Occupational History   Not on file  Tobacco Use   Smoking status: Never    Passive exposure: Past   Smokeless tobacco: Never  Vaping Use   Vaping Use: Never used  Substance and Sexual Activity   Alcohol use: No   Drug use: No   Sexual activity: Not Currently    Birth control/protection: Surgical  Other Topics Concern   Not on file  Social History Narrative   Not on file   Social Determinants of Health   Financial Resource Strain: Not on file  Food Insecurity: Not on file  Transportation Needs: Not on file  Physical Activity: Not on file  Stress: Not on file  Social Connections: Not on file    Subjective: Review of Systems  Constitutional:  Negative for chills and fever.  HENT:  Negative for congestion and hearing loss.   Eyes:  Negative for blurred vision and double vision.  Respiratory:  Negative for cough and shortness of breath.   Cardiovascular:  Negative for chest pain and palpitations.  Gastrointestinal:  Positive for abdominal pain, constipation and heartburn. Negative for blood in stool, diarrhea, melena and vomiting.       Dysphagia  Genitourinary:  Negative for dysuria and urgency.  Musculoskeletal:  Negative for joint pain and myalgias.  Skin:  Negative for itching and rash.  Neurological:  Negative for dizziness and headaches.  Psychiatric/Behavioral:  Negative for depression. The patient is not nervous/anxious.      Objective: BP 114/75 (BP Location: Left Arm, Patient Position: Sitting, Cuff Size: Normal)   Pulse 69   Temp 97.9 F (36.6 C) (Oral)   Ht  '4\' 8"'$  (1.422 m)   Wt 156 lb 1.6 oz (70.8 kg)   BMI 35.00 kg/m  Physical Exam Constitutional:      Appearance: Normal appearance.  HENT:     Head: Normocephalic and atraumatic.  Eyes:     Extraocular Movements: Extraocular movements intact.     Conjunctiva/sclera: Conjunctivae normal.  Cardiovascular:     Rate and Rhythm: Normal rate and regular rhythm.  Pulmonary:     Effort: Pulmonary effort is normal.     Breath sounds: Normal breath sounds.  Abdominal:     General: Bowel sounds are normal.     Palpations: Abdomen is soft.  Musculoskeletal:  General: No swelling. Normal range of motion.     Cervical back: Normal range of motion and neck supple.  Skin:    General: Skin is warm and dry.     Coloration: Skin is not jaundiced.  Neurological:     General: No focal deficit present.     Mental Status: She is alert and oriented to person, place, and time.  Psychiatric:        Mood and Affect: Mood normal.        Behavior: Behavior normal.      Assessment: *Chronic constipation *Abdominal pain *Dysphagia-chronic *GERD    Plan: Constipation well controlled on as daily MiraLAX.  Will continue  Abdominal pain much improved on dicyclomine as needed.  We will continue.  Her dysphagia likely multifactorial with esophageal dysmotility as well as a large hiatal hernia.  Do not think she would be a candidate for hiatal hernia repair given her age as well as the fact that she has esophageal dysmotility.    Offered EGD to further evaluate with potential esophageal dilation and patient would like to hold off for now.  If she would like to have this procedure done, then she will call our office and we will set this up.  Chronic GERD well-controlled on pantoprazole daily.  We will continue.  Patient to avoid tough textures. All meats should be chopped finely. Eat slowly, take small bites, chew thoroughly, and drink plenty of liquids throughout meals.   Follow-up in 4  months.   03/03/2022 9:30 AM   Disclaimer: This note was dictated with voice recognition software. Similar sounding words can inadvertently be transcribed and may not be corrected upon review.

## 2022-03-03 NOTE — Patient Instructions (Signed)
I am happy to hear that you are doing well.  Continue MiraLAX for your chronic constipation.  Continue pantoprazole for your chronic reflux.  I have refilled your dicyclomine to take as needed for abdominal pain.  I have also refilled your ketoconazole cream for your navel area.  Follow-up in 4 months or sooner if needed.  It is always a pleasure seeing you.  Dr. Abbey Chatters

## 2022-05-09 IMAGING — RF DG SWALLOWING FUNCTION
13 series · 13 of 24 positions shown · non-contrast
Comparison: Esophagram 12/06/2019

CLINICAL DATA: Dysphagia. Feels like things stick in her throat.
Prior abnormal esophagram demonstrating laryngeal penetration

EXAM:
MODIFIED BARIUM SWALLOW
TECHNIQUE: Different consistencies of barium were administered orally to the
patient by the Speech Pathologist. Imaging of the pharynx was
performed in the lateral projection. The radiologist was present in
the fluoroscopy room for this study, providing personal supervision.
FLUOROSCOPY TIME:  Fluoroscopy Time:  1 minutes 48 seconds
Radiation Exposure Index (if provided by the fluoroscopic device):
25.3 mGy
Number of Acquired Spot Images: multiple fluoroscopic screen
captures

[Series 1: run · 1 of 25 frames shown]
[frame 4/25]
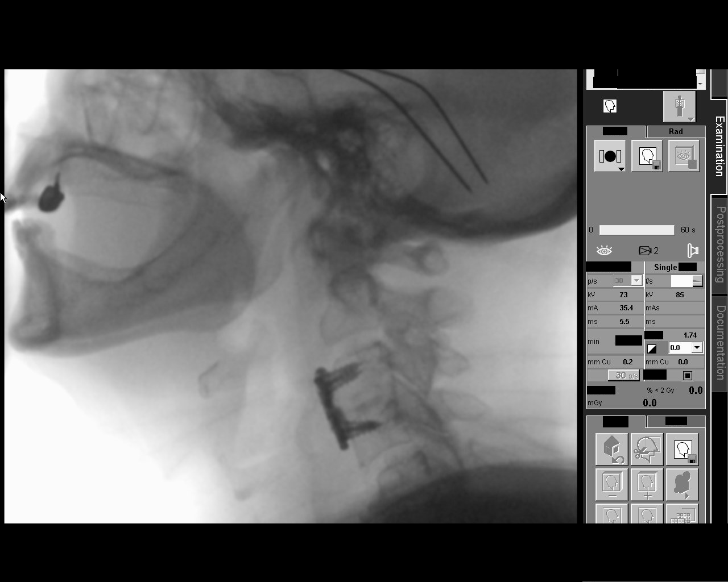

[Series 2: tsp thin · 1 of 117 frames shown]
[frame 18/117]
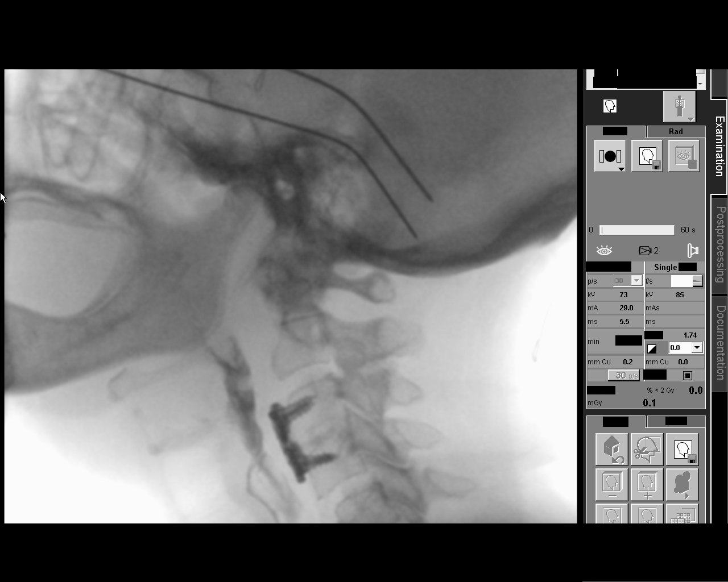

[Series 3: cup sip thin · 1 of 71 frames shown (1 of 3)]
[frame 36/71]
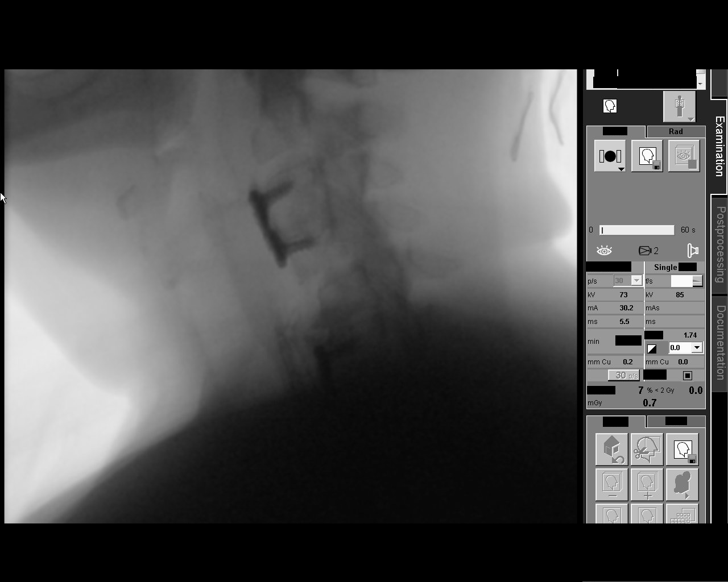

[Series 4: cup sip thin · 1 of 194 frames shown (2 of 3)]
[frame 30/194]
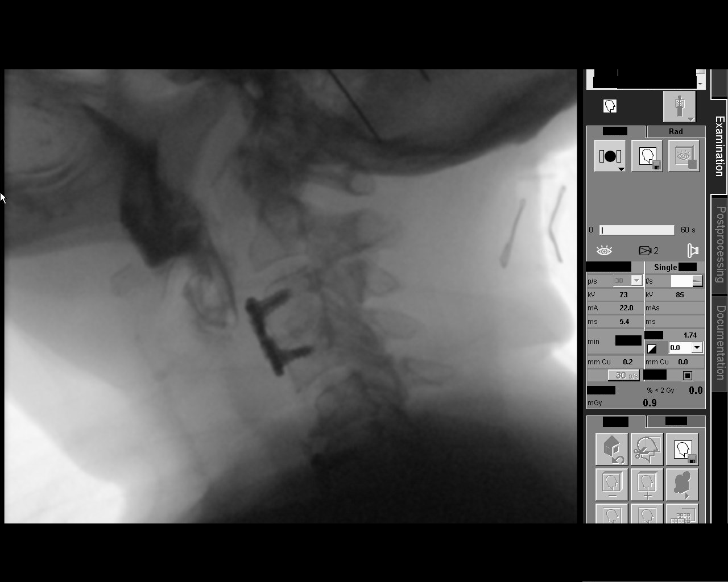

[Series 5: cup sip thin · 1 of 132 frames shown (3 of 3)]
[frame 67/132]
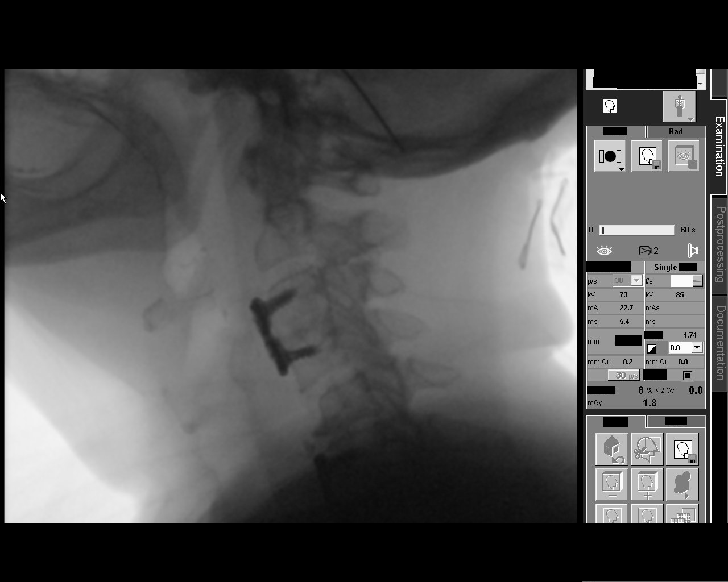

[Series 6: straw thin · 1 of 262 frames shown]
[frame 206/262]
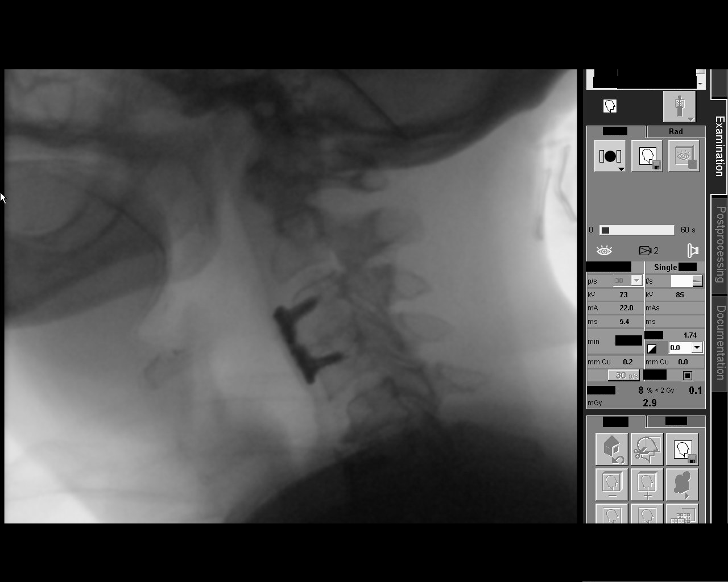

[Series 7: puree · 1 of 610 frames shown (1 of 3)]
[frame 566/610]
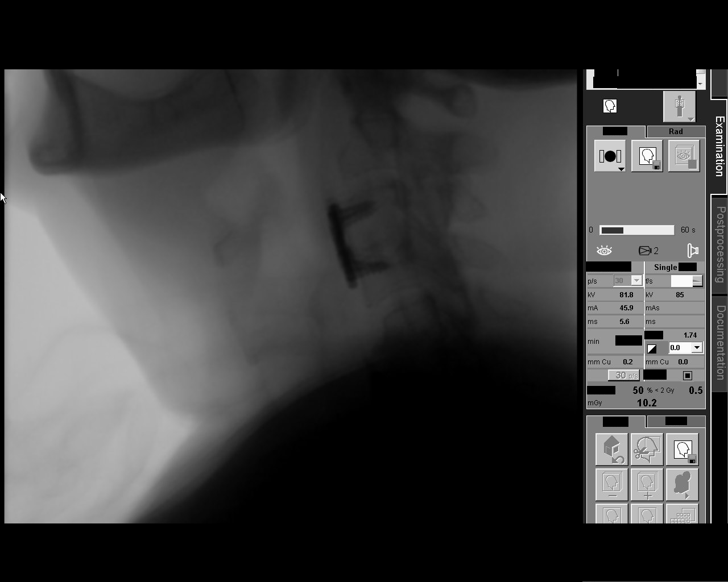

[Series 8: puree · 1 of 245 frames shown (2 of 3)]
[frame 37/245]
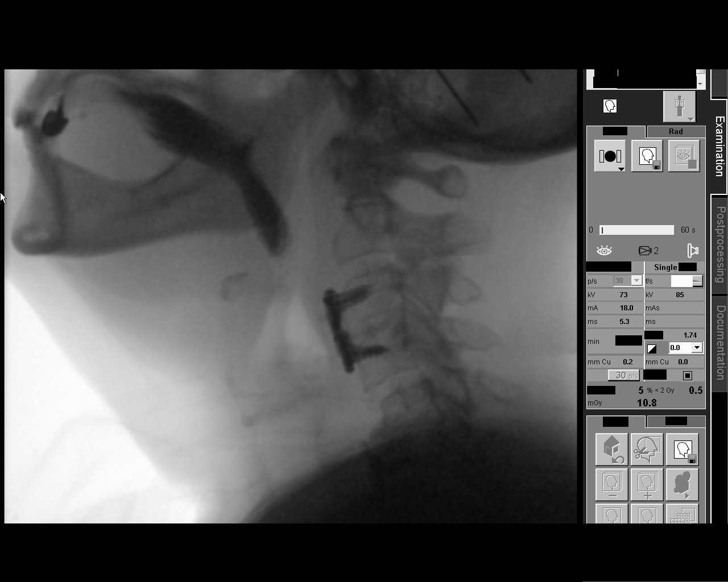

[Series 9: puree · 1 of 133 frames shown (3 of 3)]
[frame 67/133]
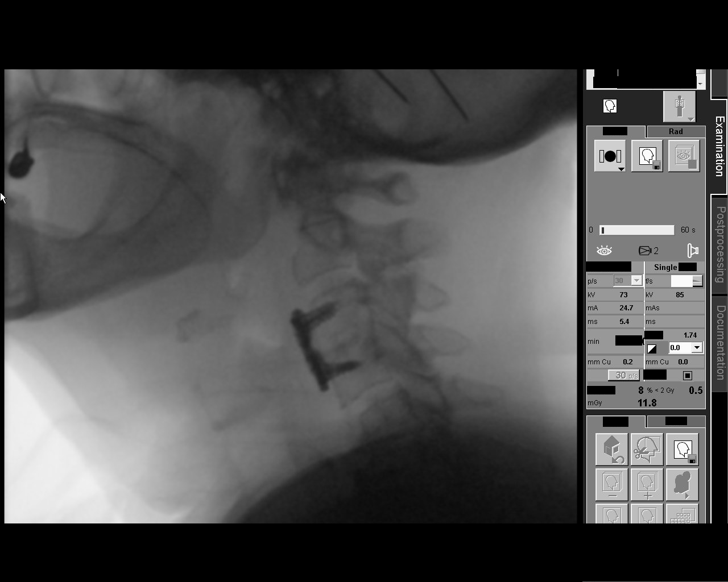

[Series 10: thin cup · 1 of 111 frames shown]
[frame 56/111]
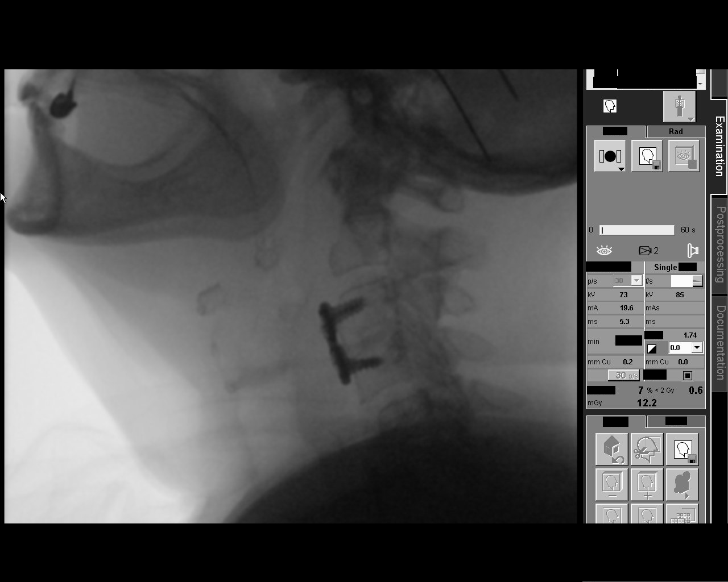

[Series 11: regular · 1 of 266 frames shown (1 of 2)]
[frame 134/266]
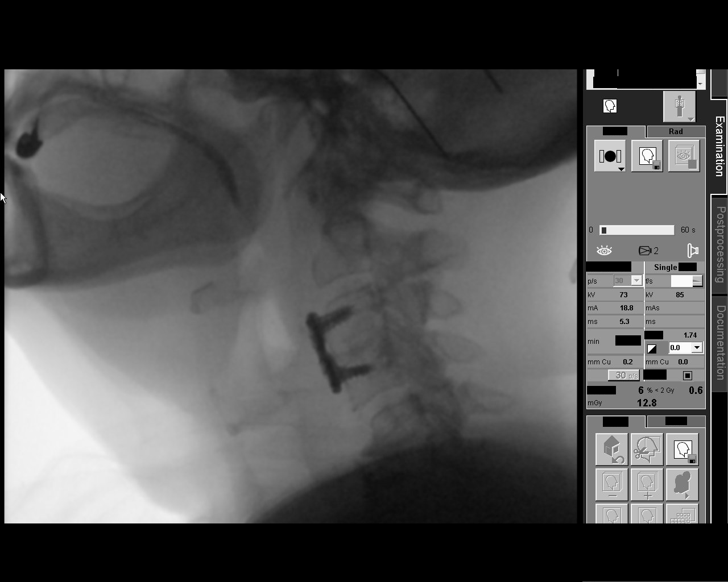

[Series 12: regular · 1 of 434 frames shown (2 of 2)]
[frame 369/434]
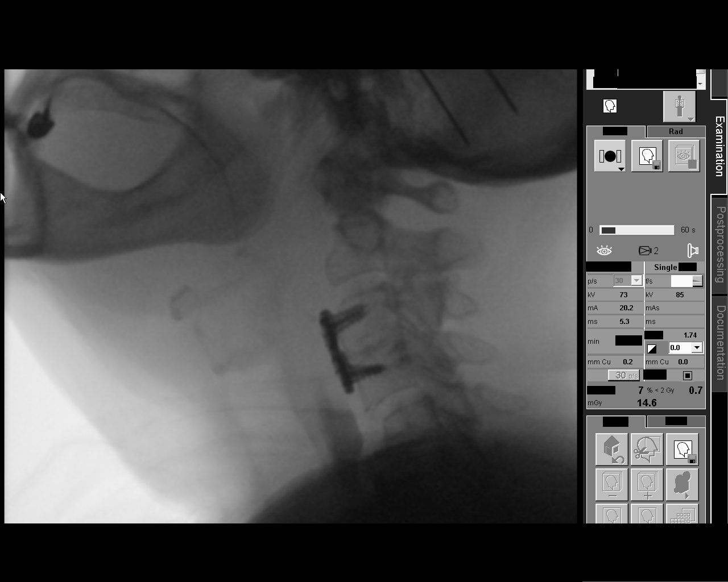

[Series 13: thin tablet · 1 of 672 frames shown]
[frame 572/672]
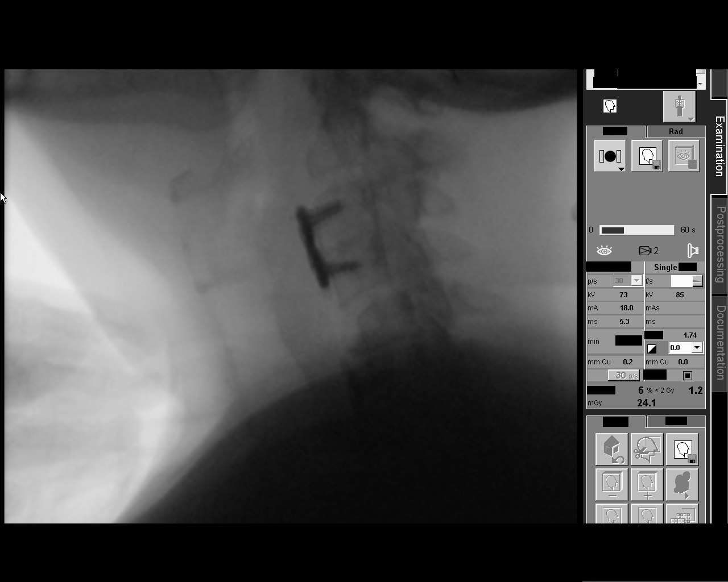

[13 of 24 positions shown; findings below may reference images not displayed]

FINDINGS: Flash laryngeal penetration occurred with swallows of thin barium by
cup and straw presentation. No aspiration or residuals. Remainder of
exam normal. Applesauce and cracker consistencies were swallowed
without difficulty. 12.5 mm diameter barium tablet passed to stomach
without obstruction. Patient related a sensation of retained barium
at the cervical region throughout the exam though no residuals or
obstruction were identified.
IMPRESSION: Flash laryngeal penetration of thin barium as above.

Otherwise negative exam.

Please refer to the Speech Pathologists report for complete details
and recommendations.

## 2022-06-20 NOTE — Progress Notes (Unsigned)
Referring Provider: Kirstie Peri, MD Primary Care Physician:  Kirstie Peri, MD Primary GI Physician: Dr. Marletta Lor  No chief complaint on file.   HPI:   Olivia Powers is a 82 y.o. female with history of constipation with associated abdominal pain, GERD, dysphagia, esophageal dysmotility, large hiatal hernia, presenting today for follow-up.  Last seen in our office 03/03/2022.  Reflux well-controlled on tonics 40 mg daily.  Still has issues with dysphagia intermittently.  Constipation doing well with MiraLAX.  Abdominal pain controlled with dicyclomine as needed.  She was offered EGD with possible dilation, but patient preferred to hold off.  Advised to continue current medications, follow-up in 4 months.  Today:   Past Medical History:  Diagnosis Date   Alopecia    Arthritis    Cancer (HCC)    skin cancer of chest melanoma   Diabetes mellitus without complication (HCC)    taken off Metformin by PCP this year.   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   Hypercholesteremia    Hypertension     Past Surgical History:  Procedure Laterality Date   ABDOMINAL HYSTERECTOMY     BACK SURGERY     lower   CATARACT EXTRACTION W/PHACO Left 02/11/2014   Procedure: CATARACT EXTRACTION PHACO AND INTRAOCULAR LENS PLACEMENT LEFT EYE;  Surgeon: Gemma Payor, MD;  Location: [facility];  Service: Ophthalmology;  Laterality: Left;  CDE:4.93   CATARACT EXTRACTION W/PHACO Right 03/11/2014   Procedure: CATARACT EXTRACTION PHACO AND INTRAOCULAR LENS PLACEMENT RIGHT EYE CDE=8.52;  Surgeon: Gemma Payor, MD;  Location: [facility];  Service: Ophthalmology;  Laterality: Right;   CERVICAL DISC SURGERY     CESAREAN SECTION     x2   CRANIECTOMY FOR EXCISION OF ACOUSTIC NEUROMA Left 1990, 1992   ESOPHAGOGASTRODUODENOSCOPY N/A 04/22/2014   Procedure: ESOPHAGOGASTRODUODENOSCOPY (EGD);  Surgeon: Rachael Fee, MD;  Location: [facility];  Service: Endoscopy;  Laterality: N/A;   TOTAL  HIP ARTHROPLASTY Left 07/06/2017   Procedure: LEFT TOTAL HIP ARTHROPLASTY ANTERIOR APPROACH;  Surgeon: Ollen Gross, MD;  Location: [facility];  Service: Orthopedics;  Laterality: Left;  Failed Spinal   TOTAL HIP ARTHROPLASTY Right 08/23/2018   Procedure: TOTAL HIP ARTHROPLASTY ANTERIOR APPROACH;  Surgeon: Ollen Gross, MD;  Location: [facility];  Service: Orthopedics;  Laterality: Right;    TOTAL KNEE ARTHROPLASTY Bilateral 1990    Current Outpatient Medications  Medication Sig Dispense Refill   acetaminophen (TYLENOL) 500 MG tablet Take 1,000 mg by mouth every 6 (six) hours as needed for moderate pain or headache.      allopurinol (ZYLOPRIM) 100 MG tablet Take 100 mg by mouth daily.     dicyclomine (BENTYL) 10 MG capsule Take 1 capsule (10 mg total) by mouth 2 (two) times daily as needed. For abdominal pain or spasms. 60 capsule 11   gabapentin (NEURONTIN) 300 MG capsule Take 300 mg by mouth 2 (two) times daily. Occ takes more than twice a day     ketoconazole (NIZORAL) 2 % cream Apply twice daily to irritated belly button for 10 days or at least 2 days after rash resolves. 30 g 3   losartan (COZAAR) 25 MG tablet Take 25 mg by mouth daily.     Melatonin 5 MG TABS Take 10 mg by mouth at bedtime.     pantoprazole (PROTONIX) 40 MG tablet Take 40 mg by mouth daily.     polyethylene glycol (MIRALAX / GLYCOLAX)  packet Take 17 g by mouth daily.     simvastatin (ZOCOR) 20 MG tablet Take 20 mg by mouth daily.     No current facility-administered medications for this visit.    Allergies as of 06/23/2022 - Review Complete 03/03/2022  Allergen Reaction Noted   Codeine Nausea And Vomiting 02/07/2014   Tramadol  06/30/2017    No family history on file.  Social History   Socioeconomic History   Marital status: Widowed    Spouse name: Not on file   Number of children: Not on file   Years of education: Not on file   Highest education level: Not on file  Occupational History   Not on file   Tobacco Use   Smoking status: Never    Passive exposure: Past   Smokeless tobacco: Never  Vaping Use   Vaping Use: Never used  Substance and Sexual Activity   Alcohol use: No   Drug use: No   Sexual activity: Not Currently    Birth control/protection: Surgical  Other Topics Concern   Not on file  Social History Narrative   Not on file   Social Determinants of Health   Financial Resource Strain: Not on file  Food Insecurity: Not on file  Transportation Needs: Not on file  Physical Activity: Not on file  Stress: Not on file  Social Connections: Not on file    Review of Systems: Gen: Denies fever, chills, anorexia. Denies fatigue, weakness, weight loss.  CV: Denies chest pain, palpitations, syncope, peripheral edema, and claudication. Resp: Denies dyspnea at rest, cough, wheezing, coughing up blood, and pleurisy. GI: Denies vomiting blood, jaundice, and fecal incontinence.   Denies dysphagia or odynophagia. Derm: Denies rash, itching, dry skin Psych: Denies depression, anxiety, memory loss, confusion. No homicidal or suicidal ideation.  Heme: Denies bruising, bleeding, and enlarged lymph nodes.  Physical Exam: There were no vitals taken for this visit. General:   Alert and oriented. No distress noted. Pleasant and cooperative.  Head:  Normocephalic and atraumatic. Eyes:  Conjuctiva clear without scleral icterus. Heart:  S1, S2 present without murmurs appreciated. Lungs:  Clear to auscultation bilaterally. No wheezes, rales, or rhonchi. No distress.  Abdomen:  +BS, soft, non-tender and non-distended. No rebound or guarding. No HSM or masses noted. Msk:  Symmetrical without gross deformities. Normal posture. Extremities:  Without edema. Neurologic:  Alert and  oriented x4 Psych:  Normal mood and affect.    Assessment:     Plan:  ***   Ermalinda Memos, PA-C [facility] 06/23/2022

## 2022-06-23 ENCOUNTER — Encounter: Payer: Self-pay | Admitting: Gastroenterology

## 2022-06-23 ENCOUNTER — Ambulatory Visit: Payer: Medicare Other | Admitting: Gastroenterology

## 2022-06-23 VITALS — BP 132/74 | HR 86 | Temp 97.8°F | Ht 59.0 in | Wt 158.0 lb

## 2022-06-23 DIAGNOSIS — K219 Gastro-esophageal reflux disease without esophagitis: Secondary | ICD-10-CM | POA: Diagnosis not present

## 2022-06-23 DIAGNOSIS — R109 Unspecified abdominal pain: Secondary | ICD-10-CM

## 2022-06-23 DIAGNOSIS — R10816 Epigastric abdominal tenderness: Secondary | ICD-10-CM

## 2022-06-23 DIAGNOSIS — R1319 Other dysphagia: Secondary | ICD-10-CM

## 2022-06-23 DIAGNOSIS — R195 Other fecal abnormalities: Secondary | ICD-10-CM

## 2022-06-23 DIAGNOSIS — R10811 Right upper quadrant abdominal tenderness: Secondary | ICD-10-CM

## 2022-06-23 DIAGNOSIS — K5900 Constipation, unspecified: Secondary | ICD-10-CM

## 2022-06-23 MED ORDER — DICYCLOMINE HCL 10 MG PO CAPS: 10.0000 mg | ORAL_CAPSULE | Freq: Two times a day (BID) | ORAL | 5 refills | Status: DC | PRN

## 2022-06-23 NOTE — Patient Instructions (Addendum)
Resume taking pantoprazole 40 mg every day 30 minutes before breakfast.  This medication is for reflux.  Use dicyclomine as needed for abdominal cramping.  I am sending a refill of this medication to your pharmacy.  For constipation: Try taking MiraLAX 17 g in 8 ounces of water or other noncarbonated beverage of your choice every other day. Add Benefiber 2 teaspoons daily x 2 weeks, then increase to twice daily. If MiraLAX causes her stool to be watery, you can try Colace (docusate sodium) 100-200 mg daily in place of MiraLAX. It is important that you drink plenty of water.  Goal of at least 64 ounces of water daily. Consume plenty of fruits, vegetables, whole grains to maintain adequate fiber intake.  Swallowing precautions:  Eat slowly, take small bites, chew thoroughly, drink plenty of liquids throughout meals.  Avoid trough textures All meats should be chopped finely.  If something gets hung in your esophagus and will not come up or go down, proceed to the emergency room.    We will plan to see you back in the office in about 3 months.  Do not hesitate to call sooner if you have questions or concerns.  It was very nice to meet you today!  Ermalinda Memos, PA-C [facility]

## 2022-06-29 ENCOUNTER — Telehealth: Payer: Self-pay | Admitting: *Deleted

## 2022-06-29 NOTE — Telephone Encounter (Signed)
She reported similar symptoms a few months ago as well. We discussed colonoscopy at her most recent visit, but she declined. If she is having rectal bleeding, I recommend proceeding with a colonoscopy to evaluate her symptoms further.  Please let me know if she is agreeable.  Additionally, recommend updating a CBC.  Please arrange.  Dx: Rectal bleeding.  If she develops significant rectal bleeding, she should proceed to the emergency room.

## 2022-06-29 NOTE — Telephone Encounter (Signed)
Spoke to pt, he informed me that she seen blood in stool yesterday morning and this morning. She wants to know if she should be concerned. She states it's not a lot of blood and it is bright red.

## 2022-06-30 ENCOUNTER — Other Ambulatory Visit: Payer: Self-pay | Admitting: *Deleted

## 2022-06-30 DIAGNOSIS — K625 Hemorrhage of anus and rectum: Secondary | ICD-10-CM

## 2022-06-30 NOTE — Telephone Encounter (Signed)
Spoke to pt, informed her of recommendations. She voiced understanding. Labs entered into Epic. She states no blood today and she will have labs done next week due to transportation issues.

## 2022-06-30 NOTE — Telephone Encounter (Signed)
She states she would like to wait. She does not think that she needs it right now.

## 2022-06-30 NOTE — Telephone Encounter (Signed)
Did she want to schedule colonoscopy?

## 2022-06-30 NOTE — Telephone Encounter (Signed)
Noted  

## 2022-07-07 ENCOUNTER — Other Ambulatory Visit (HOSPITAL_COMMUNITY)
Admission: RE | Admit: 2022-07-07 | Discharge: 2022-07-07 | Disposition: A | Discharge status: Home / Self Care | Payer: Medicare Other | Source: Ambulatory Visit | Attending: Gastroenterology | Admitting: Gastroenterology

## 2022-07-07 DIAGNOSIS — K625 Hemorrhage of anus and rectum: Secondary | ICD-10-CM | POA: Insufficient documentation

## 2022-07-07 LAB — CBC WITH DIFFERENTIAL/PLATELET
Abs Immature Granulocytes: 0.02 10*3/uL (ref 0.00–0.07)
Basophils Absolute: 0.1 10*3/uL (ref 0.0–0.1)
Basophils Relative: 2 %
Eosinophils Absolute: 0.2 10*3/uL (ref 0.0–0.5)
Eosinophils Relative: 3 %
HCT: 42.5 % (ref 36.0–46.0)
Hemoglobin: 13.8 g/dL (ref 12.0–15.0)
Immature Granulocytes: 0 %
Lymphocytes Relative: 27 %
Lymphs Abs: 1.8 10*3/uL (ref 0.7–4.0)
MCH: 29.6 pg (ref 26.0–34.0)
MCHC: 32.5 g/dL (ref 30.0–36.0)
MCV: 91 fL (ref 80.0–100.0)
Monocytes Absolute: 0.4 10*3/uL (ref 0.1–1.0)
Monocytes Relative: 6 %
Neutro Abs: 4.2 10*3/uL (ref 1.7–7.7)
Neutrophils Relative %: 62 %
Platelets: 234 10*3/uL (ref 150–400)
RBC: 4.67 MIL/uL (ref 3.87–5.11)
RDW: 13.9 % (ref 11.5–15.5)
WBC: 6.7 10*3/uL (ref 4.0–10.5)
nRBC: 0 % (ref 0.0–0.2)

## 2022-09-02 ENCOUNTER — Encounter: Payer: Self-pay | Admitting: Gastroenterology

## 2022-09-02 ENCOUNTER — Ambulatory Visit: Payer: Medicare Other | Admitting: Gastroenterology

## 2022-09-02 VITALS — BP 134/64 | HR 76 | Temp 97.6°F | Ht <= 58 in | Wt 150.6 lb

## 2022-09-02 DIAGNOSIS — R198 Other specified symptoms and signs involving the digestive system and abdomen: Secondary | ICD-10-CM

## 2022-09-02 DIAGNOSIS — R1010 Upper abdominal pain, unspecified: Secondary | ICD-10-CM

## 2022-09-02 DIAGNOSIS — R1013 Epigastric pain: Secondary | ICD-10-CM

## 2022-09-02 DIAGNOSIS — R1310 Dysphagia, unspecified: Secondary | ICD-10-CM

## 2022-09-02 MED ORDER — PANTOPRAZOLE SODIUM 40 MG PO TBEC: 40.0000 mg | DELAYED_RELEASE_TABLET | Freq: Two times a day (BID) | ORAL | 3 refills | Status: DC

## 2022-09-02 NOTE — Progress Notes (Signed)
Referring Provider: Kirstie Peri, MD Primary Care Physician:  Kirstie Peri, MD Primary GI Physician: Dr. Marletta Lor  Chief Complaint  Patient presents with   Dysphagia    Having trouble swallowing. Feels like foods is stopping in her chest. Stomach is hurting she feels full at night. Change in bowel habits      HPI:   Olivia Powers is a 82 y.o. female presenting today with a history of GERD, chronic dysphagia, esophageal dysmotility, large hiatal hernia, constipation with associated abdominal pain, presenting today with chief complaint of postprandial epigastric abdominal pain, ongoing dysphagia, and alternating constipation and diarrhea.    Last seen in the office 06/23/22  for follow-up of GERD, dysphagia, constipation, and also reported change in stool caliber.  She was having reflux symptoms twice a week, only taking pantoprazole as needed.  Ongoing dysphagia typically doing okay as long as she followed a soft diet.  She reported having trouble with meats and breads and had an episode of regurgitation after eating a waffle.  Chronic constipation with not adequately managed with MiraLAX every few days and noted change in stool caliber with thin stools.  She had a single episode of rectal bleeding several months prior with no recurrent symptoms.  She reported history of hemorrhoids, using Preparation H as needed.  On abdominal exam, noted patient had epigastric and RUQ tenderness, but she had not noticed any pain in these areas until I palpated her abdomen.  She denied postprandial symptoms, nausea, vomiting.  Recommended resuming pantoprazole daily, take MiraLAX 17 g every other day, add Benefiber daily. Offered repeat EGD with possible dilation, but patient declined.  Offered a colonoscopy to further evaluate change in stool caliber and prior episode of rectal bleeding, but patient declined.  Today:  Abdominal pain: Totally Postprandial epigastric abdominal pain for the last several several  weeks. No specific food triggers. Getting full quickly.  Taking Pantoprazole every day. No typical heartburn symptoms. No nausea. Occasional regurgitation when something is filling up her esophagus. Foods goes down esophagus slowly and gets stuck.   No NSAIDs.    BPE 12/06/2019 with moderate esophageal dysmotility with esophagus appearing minimally dilated and hypotonic, large hiatal hernia, laryngeal penetration without aspiration.   Last EGD March 2016: 3 cm hiatal hernia without Camerons type erosions, mild nonspecific distal gastritis biopsied, short linear erosion at GE junction possibly representing Hilliary Mallory-Weiss tear.  Gastric biopsies with mild chronic inactive gastritis, negative for H. Pylori   Constipation:  No brbpr or melena. Bowels fluctuate from constipation or diarrhea. Taking MiraLAX daily. Can skip 3-4 days without a BM. Then will have 3-4 Bms in a day and stools start out formed and then become loose. Takes fiber pill twice a week.    Last colonoscopy was years ago. Has history of polyps. No declined repeat colonoscopy.    Past Medical History:  Diagnosis Date   Alopecia    Arthritis    Cancer (HCC)    skin cancer of chest melanoma   Diabetes mellitus without complication (HCC)    taken off Metformin by PCP this year.   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   Hypercholesteremia    Hypertension     Past Surgical History:  Procedure Laterality Date   ABDOMINAL HYSTERECTOMY     BACK SURGERY     lower   CATARACT EXTRACTION W/PHACO Left 02/11/2014   Procedure: CATARACT EXTRACTION PHACO AND INTRAOCULAR LENS  PLACEMENT LEFT EYE;  Surgeon: Gemma Payor, MD;  Location: [facility];  Service: Ophthalmology;  Laterality: Left;  CDE:4.93   CATARACT EXTRACTION W/PHACO Right 03/11/2014   Procedure: CATARACT EXTRACTION PHACO AND INTRAOCULAR LENS PLACEMENT RIGHT EYE CDE=8.52;  Surgeon: Gemma Payor, MD;  Location: [facility];  Service:  Ophthalmology;  Laterality: Right;   CERVICAL DISC SURGERY     CESAREAN SECTION     x2   CRANIECTOMY FOR EXCISION OF ACOUSTIC NEUROMA Left 1990, 1992   ESOPHAGOGASTRODUODENOSCOPY N/A 04/22/2014   Procedure: ESOPHAGOGASTRODUODENOSCOPY (EGD);  Surgeon: Rachael Fee, MD;  Location: [facility] ENDOSCOPY;  Service: Endoscopy;  Laterality: N/A;   TOTAL HIP ARTHROPLASTY Left 07/06/2017   Procedure: LEFT TOTAL HIP ARTHROPLASTY ANTERIOR APPROACH;  Surgeon: Ollen Gross, MD;  Location: [facility];  Service: Orthopedics;  Laterality: Left;  Failed Spinal   TOTAL HIP ARTHROPLASTY Right 08/23/2018   Procedure: TOTAL HIP ARTHROPLASTY ANTERIOR APPROACH;  Surgeon: Ollen Gross, MD;  Location: [facility];  Service: Orthopedics;  Laterality: Right;    TOTAL KNEE ARTHROPLASTY Bilateral 1990    Current Outpatient Medications  Medication Sig Dispense Refill   acetaminophen (TYLENOL) 500 MG tablet Take 1,000 mg by mouth every 6 (six) hours as needed for moderate pain or headache.      allopurinol (ZYLOPRIM) 100 MG tablet Take 100 mg by mouth daily.     dicyclomine (BENTYL) 10 MG capsule Take 1 capsule (10 mg total) by mouth 2 (two) times daily as needed. For abdominal pain or spasms. 60 capsule 5   gabapentin (NEURONTIN) 300 MG capsule Take 300 mg by mouth 2 (two) times daily. Occ takes more than twice a day     ketoconazole (NIZORAL) 2 % cream Apply twice daily to irritated belly button for 10 days or at least 2 days after rash resolves. 30 g 3   losartan (COZAAR) 25 MG tablet Take 25 mg by mouth daily.     Melatonin 5 MG TABS Take 10 mg by mouth at bedtime.     pantoprazole (PROTONIX) 40 MG tablet Take 1 tablet (40 mg total) by mouth 2 (two) times daily before a meal. 60 tablet 3   polyethylene glycol (MIRALAX / GLYCOLAX) packet Take 17 g by mouth daily.     simvastatin (ZOCOR) 20 MG tablet Take 20 mg by mouth daily.     No current facility-administered medications for this visit.    Allergies as of 09/02/2022 -  Review Complete 09/02/2022  Allergen Reaction Noted   Codeine Nausea And Vomiting 02/07/2014   Tramadol  06/30/2017    History reviewed. No pertinent family history.  Social History   Socioeconomic History   Marital status: Widowed    Spouse name: Not on file   Number of children: Not on file   Years of education: Not on file   Highest education level: Not on file  Occupational History   Not on file  Tobacco Use   Smoking status: Never    Passive exposure: Past   Smokeless tobacco: Never  Vaping Use   Vaping status: Never Used  Substance and Sexual Activity   Alcohol use: No   Drug use: No   Sexual activity: Not Currently    Birth control/protection: Surgical  Other Topics Concern   Not on file  Social History Narrative   Not on file   Social Determinants of Health   Financial Resource Strain: Not on file  Food Insecurity: Not on file  Transportation Needs: Not on file  Physical Activity: Not on file  Stress: Not on file  Social Connections: Not on file    Review of Systems: Gen: Denies fever, chills, cold or flulike symptoms, presyncope, syncope. CV: Denies chest pain, palpitations. Resp: Denies dyspnea, cough. GI: See HPI Heme: See HPI  Physical Exam: BP 134/64 (BP Location: Right Arm, Patient Position: Sitting, Cuff Size: Normal)   Pulse 76   Temp 97.6 F (36.4 C) (Temporal)   Ht 4\' 10"  (1.473 m)   Wt 150 lb 9.6 oz (68.3 kg)   SpO2 94%   BMI 31.48 kg/m  General:   Alert and oriented. No distress noted. Pleasant and cooperative.  Head:  Normocephalic and atraumatic. Eyes:  Conjuctiva clear without scleral icterus. Heart:  S1, S2 present without murmurs appreciated. Lungs:  Clear to auscultation bilaterally. No wheezes, rales, or rhonchi. No distress.  Abdomen:  +BS, soft, and non-distended.  Mild TTP in epigastric and RUQ region.  No rebound or guarding. No HSM or masses noted. Msk:  Symmetrical without gross deformities. Normal  posture. Extremities:  Without edema. Neurologic:  Alert and  oriented x4 Psych:  Normal mood and affect.    Assessment:  82 y.o. female presenting today with a history of GERD, chronic dysphagia, esophageal dysmotility, large hiatal hernia, constipation with associated abdominal pain, presenting today with chief complaint of postprandial epigastric abdominal pain, ongoing dysphagia, and alternating constipation and diarrhea.   Epigastric abdominal pain: New onset postprandial epigastric abdominal pain for the last several weeks.  Associated early satiety.  Documented 8 pound weight loss since I saw her in May.  Denies NSAIDs.  Chronically on pantoprazole daily with no typical heartburn symptoms.  On exam, she has mild TTP in epigastric and RUQ region.  I recommended increasing pantoprazole to 40 mg daily and proceeding with EGD for further evaluation to rule out gastritis, duodenitis, PUD, H. pylori, malignancy.  Also recommended updating labs.  If she has any evidence of LFT elevation, will obtain RUQ ultrasound.   Dysphagia: Chronic.  Likely multifactorial in setting of hiatal hernia, esophageal dysmotility ?  Achalasia, but unable to rule out development of esophageal web, ring, stricture, or malignancy.  She is currently reporting ongoing dysphagia, sensation of her esophagus feeling up.  Last EGD March 2016 with 3 cm hiatal hernia.  Barium pill esophagram November 2021 with moderate esophageal dysmotility with esophagus appearing minimally dilated and hypotonic, large hiatal hernia, laryngeal penetration without aspiration.   Alternating constipation and diarrhea: Likely baseline constipation with overflow diarrhea, not adequately managed with MiraLAX daily and fiber twice a week.  Recommended taking Benefiber every day along with MiraLAX daily.  If she continues to have fluctuation between constipation and diarrhea, we can try prescriptive constipation agent.   Plan:  Proceed with upper  endoscopy +/- dilation with propofol by Dr. Marletta Lor in near future. The risks, benefits, and alternatives have been discussed with the patient in detail. The patient states understanding and desires to proceed.  ASA 3 CBC, CMP Increase pantoprazole to 40 mg twice daily. Continue MiraLAX 17 g daily. Start Benefiber 2 teaspoons daily x 2 weeks, then increase to twice daily. Patient will let me know if she continues to struggle with constipation and we can try prescriptive agent. Follow-up in the office after EGD.   Ermalinda Memos, PA-C [facility] 09/02/2022

## 2022-09-02 NOTE — Patient Instructions (Addendum)
Please have blood work completed at [facility].  This is a lab that on the second floor of the two-story brick building that is across from [facility] emergency department.  We will schedule you for an upper endoscopy with possible stretching of your esophagus with Dr. Marletta Lor at [facility].  Increase pantoprazole to 40 mg twice daily 30 minutes before breakfast and dinner.  To help with bowel regularity/constipation: -  Continue MiraLAX 1 capful daily.  You can purchase ClearLax.  This is the generic brand, but is the same as MiraLAX. - Start Benefiber 2 teaspoons daily x 2 weeks, then increase to twice daily. - Let me know if you continue to struggle with constipation and we can try something different.  We will follow-up with you in the office after your upper endoscopy.  Ermalinda Memos, PA-C [facility]

## 2022-09-13 ENCOUNTER — Telehealth: Payer: Self-pay | Admitting: *Deleted

## 2022-09-13 NOTE — Telephone Encounter (Signed)
[facility]  EGD +/-ED w/Dr.Carver, asa 3

## 2022-09-14 ENCOUNTER — Telehealth: Payer: Self-pay | Admitting: Internal Medicine

## 2022-09-14 NOTE — Telephone Encounter (Signed)
Line busy

## 2022-09-14 NOTE — Telephone Encounter (Signed)
Patient returning your call.

## 2022-09-15 ENCOUNTER — Encounter: Payer: Self-pay | Admitting: *Deleted

## 2022-09-15 NOTE — Telephone Encounter (Signed)
Pt has been scheduled for 10/25/22, instructions mailed.  [employer] PA: Notification or Prior Authorization is not required for the requested services You are not required to submit a notification/prior authorization based on the information provided. If you have general questions about the prior authorization requirements, visit [email or web address] > Clinician Resources > Advance and Admission Notification Requirements. The number above acknowledges your notification. Please write this reference number down for future reference. If you would like to request an organization determination, please call us at [phone]. Decision ID #: [id number]

## 2022-09-15 NOTE — Telephone Encounter (Signed)
See previous TE

## 2022-09-16 ENCOUNTER — Encounter: Payer: Self-pay | Admitting: *Deleted

## 2022-09-23 ENCOUNTER — Ambulatory Visit: Payer: Medicare Other | Admitting: Gastroenterology

## 2022-10-20 NOTE — Patient Instructions (Signed)
Olivia Powers  10/20/2022     @PREFPERIOPPHARMACY @   Your procedure is scheduled on  10/25/2022.   Report to [facility] at  [phone]  A.M.   Call this number if you have problems the morning of surgery:  [phone]  If you experience any cold or flu symptoms such as cough, fever, chills, shortness of breath, etc. between now and your scheduled surgery, please notify us at the above number.   Remember:  Follow the diet instructions given to you by the office.      Take these medicines the morning of surgery with A SIP OF WATER                    allopurinol, gabapentin, pantoprazole.     Do not wear jewelry, make-up or nail polish, including gel polish,  artificial nails, or any other type of covering on natural nails (fingers and  toes).  Do not wear lotions, powders, or perfumes, or deodorant.  Do not shave 48 hours prior to surgery.  Men may shave face and neck.  Do not bring valuables to the hospital.  [facility] is not responsible for any belongings or valuables.  Contacts, dentures or bridgework may not be worn into surgery.  Leave your suitcase in the car.  After surgery it may be brought to your room.  For patients admitted to the hospital, discharge time will be determined by your treatment team.  Patients discharged the day of surgery will not be allowed to drive home and must have someone with them for 24 hours.    Special instructions:   DO NOT smoke tobacco or vape for 24 hours before your procedure.  Please read over the following fact sheets that you were given. Anesthesia Post-op Instructions and Care and Recovery After Surgery      Colonoscopy, Adult, Care After The following information offers guidance on how to care for yourself after your procedure. Your health care provider may also give you more specific instructions. If you have problems or questions, contact your health care provider. What can I expect after the procedure? After the  procedure, it is common to have: A small amount of blood in your stool for 24 hours after the procedure. Some gas. Mild cramping or bloating of your abdomen. Follow these instructions at home: Eating and drinking  Drink enough fluid to keep your urine pale yellow. Follow instructions from your health care provider about eating or drinking restrictions. Resume your normal diet as told by your health care provider. Avoid heavy or fried foods that are hard to digest. Activity Rest as told by your health care provider. Avoid sitting for a long time without moving. Get up to take short walks every 1-2 hours. This is important to improve blood flow and breathing. Ask for help if you feel weak or unsteady. Return to your normal activities as told by your health care provider. Ask your health care provider what activities are safe for you. Managing cramping and bloating  Try walking around when you have cramps or feel bloated. If directed, apply heat to your abdomen as told by your health care provider. Use the heat source that your health care provider recommends, such as a moist heat pack or a heating pad. Place a towel between your skin and the heat source. Leave the heat on for 20-30 minutes. Remove the heat if your skin turns bright red. This is especially important if you  are unable to feel pain, heat, or cold. You have a greater risk of getting burned. General instructions If you were given a sedative during the procedure, it can affect you for several hours. Do not drive or operate machinery until your health care provider says that it is safe. For the first 24 hours after the procedure: Do not sign important documents. Do not drink alcohol. Do your regular daily activities at a slower pace than normal. Eat soft foods that are easy to digest. Take over-the-counter and prescription medicines only as told by your health care provider. Keep all follow-up visits. This is important. Contact  a health care provider if: You have blood in your stool 2-3 days after the procedure. Get help right away if: You have more than a small spotting of blood in your stool. You have large blood clots in your stool. You have swelling of your abdomen. You have nausea or vomiting. You have a fever. You have increasing pain in your abdomen that is not relieved with medicine. These symptoms may be an emergency. Get help right away. Call 911. Do not wait to see if the symptoms will go away. Do not drive yourself to the hospital. Summary After the procedure, it is common to have a small amount of blood in your stool. You may also have mild cramping and bloating of your abdomen. If you were given a sedative during the procedure, it can affect you for several hours. Do not drive or operate machinery until your health care provider says that it is safe. Get help right away if you have a lot of blood in your stool, nausea or vomiting, a fever, or increased pain in your abdomen. This information is not intended to replace advice given to you by your health care provider. Make sure you discuss any questions you have with your health care provider. Document Revised: 03/02/2022 Document Reviewed: 09/10/2020 Elsevier Patient Education  2024 [employer] Monitored Anesthesia Care, Care After The following information offers guidance on how to care for yourself after your procedure. Your health care provider may also give you more specific instructions. If you have problems or questions, contact your health care provider. What can I expect after the procedure? After the procedure, it is common to have: Tiredness. Little or no memory about what happened during or after the procedure. Impaired judgment when it comes to making decisions. Nausea or vomiting. Some trouble with balance. Follow these instructions at home: For the time period you were told by your health care provider:  Rest. Do not participate  in activities where you could fall or become injured. Do not drive or use machinery. Do not drink alcohol. Do not take sleeping pills or medicines that cause drowsiness. Do not make important decisions or sign legal documents. Do not take care of children on your own. Medicines Take over-the-counter and prescription medicines only as told by your health care provider. If you were prescribed antibiotics, take them as told by your health care provider. Do not stop using the antibiotic even if you start to feel better. Eating and drinking Follow instructions from your health care provider about what you may eat and drink. Drink enough fluid to keep your urine pale yellow. If you vomit: Drink clear fluids slowly and in small amounts as you are able. Clear fluids include water, ice chips, low-calorie sports drinks, and fruit juice that has water added to it (diluted fruit juice). Eat light and bland foods in small amounts as  you are able. These foods include bananas, applesauce, rice, lean meats, toast, and crackers. General instructions  Have a responsible adult stay with you for the time you are told. It is important to have someone help care for you until you are awake and alert. If you have sleep apnea, surgery and some medicines can increase your risk for breathing problems. Follow instructions from your health care provider about wearing your sleep device: When you are sleeping. This includes during daytime naps. While taking prescription pain medicines, sleeping medicines, or medicines that make you drowsy. Do not use any products that contain nicotine or tobacco. These products include cigarettes, chewing tobacco, and vaping devices, such as e-cigarettes. If you need help quitting, ask your health care provider. Contact a health care provider if: You feel nauseous or vomit every time you eat or drink. You feel light-headed. You are still sleepy or having trouble with balance after 24  hours. You get a rash. You have a fever. You have redness or swelling around the IV site. Get help right away if: You have trouble breathing. You have new confusion after you get home. These symptoms may be an emergency. Get help right away. Call 911. Do not wait to see if the symptoms will go away. Do not drive yourself to the hospital. This information is not intended to replace advice given to you by your health care provider. Make sure you discuss any questions you have with your health care provider. Document Revised: 06/15/2021 Document Reviewed: 06/15/2021 Elsevier Patient Education  2024 [employer].

## 2022-10-21 ENCOUNTER — Encounter (HOSPITAL_COMMUNITY)
Admission: RE | Admit: 2022-10-21 | Discharge: 2022-10-21 | Disposition: A | Discharge status: Home / Self Care | Payer: Medicare Other | Source: Ambulatory Visit | Attending: Internal Medicine | Admitting: Internal Medicine

## 2022-10-21 DIAGNOSIS — Z01810 Encounter for preprocedural cardiovascular examination: Secondary | ICD-10-CM | POA: Diagnosis present

## 2022-10-21 DIAGNOSIS — E119 Type 2 diabetes mellitus without complications: Secondary | ICD-10-CM

## 2022-10-25 ENCOUNTER — Encounter (HOSPITAL_COMMUNITY): Payer: Self-pay

## 2022-10-25 ENCOUNTER — Ambulatory Visit (HOSPITAL_COMMUNITY)
Admission: RE | Admit: 2022-10-25 | Discharge: 2022-10-25 | Disposition: A | Discharge status: Home / Self Care | Payer: Medicare Other | Attending: Internal Medicine | Admitting: Internal Medicine

## 2022-10-25 ENCOUNTER — Encounter (HOSPITAL_COMMUNITY)
Admission: RE | Disposition: A | Discharge status: Home / Self Care | Payer: Self-pay | Source: Home / Self Care | Attending: Internal Medicine

## 2022-10-25 ENCOUNTER — Ambulatory Visit (HOSPITAL_COMMUNITY): Payer: Medicare Other | Admitting: Anesthesiology

## 2022-10-25 DIAGNOSIS — K259 Gastric ulcer, unspecified as acute or chronic, without hemorrhage or perforation: Secondary | ICD-10-CM | POA: Insufficient documentation

## 2022-10-25 DIAGNOSIS — K449 Diaphragmatic hernia without obstruction or gangrene: Secondary | ICD-10-CM | POA: Insufficient documentation

## 2022-10-25 DIAGNOSIS — R1013 Epigastric pain: Secondary | ICD-10-CM | POA: Diagnosis not present

## 2022-10-25 DIAGNOSIS — I10 Essential (primary) hypertension: Secondary | ICD-10-CM | POA: Insufficient documentation

## 2022-10-25 DIAGNOSIS — Q399 Congenital malformation of esophagus, unspecified: Secondary | ICD-10-CM | POA: Insufficient documentation

## 2022-10-25 DIAGNOSIS — E119 Type 2 diabetes mellitus without complications: Secondary | ICD-10-CM | POA: Insufficient documentation

## 2022-10-25 DIAGNOSIS — R1310 Dysphagia, unspecified: Secondary | ICD-10-CM | POA: Diagnosis not present

## 2022-10-25 HISTORY — PX: ESOPHAGOGASTRODUODENOSCOPY (EGD) WITH PROPOFOL: SHX5813

## 2022-10-25 HISTORY — PX: BIOPSY: SHX5522

## 2022-10-25 LAB — GLUCOSE, CAPILLARY: Glucose-Capillary: 93 mg/dL (ref 70–99)

## 2022-10-25 SURGERY — ESOPHAGOGASTRODUODENOSCOPY (EGD) WITH PROPOFOL
Anesthesia: General

## 2022-10-25 MED ORDER — LACTATED RINGERS IV SOLN: INTRAVENOUS | Status: DC

## 2022-10-25 MED ORDER — PROPOFOL 10 MG/ML IV BOLUS
INTRAVENOUS | Status: DC | PRN
  Administered 2022-10-25 (×3): 40 mg via INTRAVENOUS

## 2022-10-25 MED ORDER — LIDOCAINE HCL (CARDIAC) PF 100 MG/5ML IV SOSY
PREFILLED_SYRINGE | INTRAVENOUS | Status: DC | PRN
  Administered 2022-10-25: 60 mg via INTRAVENOUS

## 2022-10-25 NOTE — Anesthesia Postprocedure Evaluation (Signed)
Anesthesia Post Note  Patient: Olivia Powers  Procedure(s) Performed: ESOPHAGOGASTRODUODENOSCOPY (EGD) WITH PROPOFOL BIOPSY  Patient location during evaluation: Phase II Anesthesia Type: General Level of consciousness: awake Pain management: pain level controlled Vital Signs Assessment: post-procedure vital signs reviewed and stable Respiratory status: spontaneous breathing and respiratory function stable Cardiovascular status: blood pressure returned to baseline and stable Postop Assessment: no headache and no apparent nausea or vomiting Anesthetic complications: no Comments: Late entry   No notable events documented.   Last Vitals:  Vitals:   10/25/22 0654 10/25/22 0913  BP: (!) 142/47 (!) 105/57  Pulse: 69 66  Resp: 16 18  Temp: 36.7 C   SpO2: 99% 95%    Last Pain:  Vitals:   10/25/22 0913  TempSrc:   PainSc: 0-No pain                 Windell Norfolk

## 2022-10-25 NOTE — Discharge Instructions (Signed)
EGD Discharge instructions Please read the instructions outlined below and refer to this sheet in the next few weeks. These discharge instructions provide you with general information on caring for yourself after you leave the hospital. Your doctor may also give you specific instructions. While your treatment has been planned according to the most current medical practices available, unavoidable complications occasionally occur. If you have any problems or questions after discharge, please call your doctor. ACTIVITY You may resume your regular activity but move at a slower pace for the next 24 hours.  Take frequent rest periods for the next 24 hours.  Walking will help expel (get rid of) the air and reduce the bloated feeling in your abdomen.  No driving for 24 hours (because of the anesthesia (medicine) used during the test).  You may shower.  Do not sign any important legal documents or operate any machinery for 24 hours (because of the anesthesia used during the test).  NUTRITION Drink plenty of fluids.  You may resume your normal diet.  Begin with a light meal and progress to your normal diet.  Avoid alcoholic beverages for 24 hours or as instructed by your caregiver.  MEDICATIONS You may resume your normal medications unless your caregiver tells you otherwise.  WHAT YOU CAN EXPECT TODAY You may experience abdominal discomfort such as a feeling of fullness or "gas" pains.  FOLLOW-UP Your doctor will discuss the results of your test with you.  SEEK IMMEDIATE MEDICAL ATTENTION IF ANY OF THE FOLLOWING OCCUR: Excessive nausea (feeling sick to your stomach) and/or vomiting.  Severe abdominal pain and distention (swelling).  Trouble swallowing.  Temperature over 101 F (37.8 C).  Rectal bleeding or vomiting of blood.   Your esophagus was wide open.  I did not need to stretch it today.  You do have a large hiatal hernia with a few erosions.  I took biopsies to rule infection with a bacteria  called H. pylori.  Small bowel appeared normal.  Continue on pantoprazole twice daily.  Follow-up in GI office in 2 to 3 months.   I hope you have a great rest of your week!  Hennie Duos. Marletta Lor, D.O. Gastroenterology and Hepatology [facility]

## 2022-10-25 NOTE — Op Note (Signed)
[facility] Patient Name: Olivia Powers Procedure Date: 10/25/2022 8:48 AM MRN: [id number] Date of Birth: 04-01-1940 Attending MD: Hennie Duos. Marletta Lor , [place], [id number] CSN: [id number] Age: 82 Admit Type: Outpatient Procedure:                Upper GI endoscopy Indications:              Epigastric abdominal pain, Dysphagia Providers:                Hennie Duos. Marletta Lor, DO, Angelica Ran, Lennice Sites                            Technician, Technician Referring MD:              Medicines:                See the Anesthesia note for documentation of the                            administered medications Complications:            No immediate complications. Estimated Blood Loss:     Estimated blood loss was minimal. Procedure:                Pre-Anesthesia Assessment:                           - The anesthesia plan was to use monitored                            anesthesia care (MAC).                           After obtaining informed consent, the endoscope was                            passed under direct vision. Throughout the                            procedure, the patient's blood pressure, pulse, and                            oxygen saturations were monitored continuously. The                            [id number]-H190 ([id number]) scope was introduced through the                            mouth, and advanced to the second part of duodenum.                            The upper GI endoscopy was accomplished without                            difficulty. The patient tolerated the procedure                            well. Scope In:  9:05:21 AM Scope Out: 9:08:34 AM Total Procedure Duration: 0 hours 3 minutes 13 seconds  Findings:      The lower third of the esophagus was mildly tortuous.      There is no endoscopic evidence of stenosis or stricture in the entire       esophagus.      A large hiatal hernia was present. Few small Cameron erosions without       ulceration. Biopsies were taken with  a cold forceps for Helicobacter       pylori testing.      The duodenal bulb, first portion of the duodenum and second portion of       the duodenum were normal. Impression:               - Tortuous esophagus.                           - Large hiatal hernia. Biopsied.                           - Normal duodenal bulb, first portion of the                            duodenum and second portion of the duodenum. Moderate Sedation:      Per Anesthesia Care Recommendation:           - Patient has a contact number available for                            emergencies. The signs and symptoms of potential                            delayed complications were discussed with the                            patient. Return to normal activities tomorrow.                            Written discharge instructions were provided to the                            patient.                           - Resume previous diet.                           - Continue present medications.                           - Await pathology results.                           - Use Protonix (pantoprazole) 40 mg PO BID.                           - No ibuprofen, naproxen, or other non-steroidal  anti-inflammatory drugs.                           - Return to GI clinic in 3 months. Procedure Code(s):        --- Professional ---                           [phone], Esophagogastroduodenoscopy, flexible,                            transoral; with biopsy, single or multiple Diagnosis Code(s):        --- Professional ---                           Q39.9, Congenital malformation of esophagus,                            unspecified                           K44.9, Diaphragmatic hernia without obstruction or                            gangrene                           R10.13, Epigastric pain                           R13.10, Dysphagia, unspecified CPT copyright 2022 American Medical Association. All rights  reserved. The codes documented in this report are preliminary and upon coder review may  be revised to meet current compliance requirements. Hennie Duos. Marletta Lor, DO Hennie Duos. Marletta Lor, DO 10/25/2022 9:12:05 AM This report has been signed electronically. Number of Addenda: 0

## 2022-10-25 NOTE — Transfer of Care (Signed)
Immediate Anesthesia Transfer of Care Note  Patient: Olivia Powers  Procedure(s) Performed: ESOPHAGOGASTRODUODENOSCOPY (EGD) WITH PROPOFOL BIOPSY  Patient Location: Short Stay  Anesthesia Type:General  Level of Consciousness: awake, alert , and patient cooperative  Airway & Oxygen Therapy: Patient Spontanous Breathing  Post-op Assessment: Report given to RN, Post -op Vital signs reviewed and stable, and Patient moving all extremities X 4  Post vital signs: Reviewed and stable  Last Vitals:  Vitals Value Taken Time  BP 105/57 10/25/22 0913  Temp    Pulse 66 10/25/22 0913  Resp 18 10/25/22 0913  SpO2 95 % 10/25/22 0913    Last Pain:  Vitals:   10/25/22 0913  TempSrc:   PainSc: 0-No pain         Complications: No notable events documented.

## 2022-10-25 NOTE — H&P (Signed)
Primary Care Physician:  Kirstie Peri, MD Primary Gastroenterologist:  Dr. Marletta Lor  Pre-Procedure History & Physical: HPI:  Olivia Powers is a 82 y.o. female is here for an EGD with possible dilation to be performed for epigastric pain, dysphagia   Past Medical History:  Diagnosis Date   Alopecia    Arthritis    Cancer (HCC)    skin cancer of chest melanoma   Diabetes mellitus without complication (HCC)    taken off Metformin by PCP this year.   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   Hypercholesteremia    Hypertension     Past Surgical History:  Procedure Laterality Date   ABDOMINAL HYSTERECTOMY     BACK SURGERY     lower   CATARACT EXTRACTION W/PHACO Left 02/11/2014   Procedure: CATARACT EXTRACTION PHACO AND INTRAOCULAR LENS PLACEMENT LEFT EYE;  Surgeon: Gemma Payor, MD;  Location: [facility];  Service: Ophthalmology;  Laterality: Left;  CDE:4.93   CATARACT EXTRACTION W/PHACO Right 03/11/2014   Procedure: CATARACT EXTRACTION PHACO AND INTRAOCULAR LENS PLACEMENT RIGHT EYE CDE=8.52;  Surgeon: Gemma Payor, MD;  Location: [facility];  Service: Ophthalmology;  Laterality: Right;   CERVICAL DISC SURGERY     CESAREAN SECTION     x2   CRANIECTOMY FOR EXCISION OF ACOUSTIC NEUROMA Left 1990, 1992   ESOPHAGOGASTRODUODENOSCOPY N/A 04/22/2014   Procedure: ESOPHAGOGASTRODUODENOSCOPY (EGD);  Surgeon: Rachael Fee, MD;  Location: [facility];  Service: Endoscopy;  Laterality: N/A;   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 Authorizing Provider  acetaminophen (TYLENOL) 500 MG tablet Take 1,000 mg by mouth every 6 (six) hours as needed for moderate pain or headache.    Yes [provider]  allopurinol (ZYLOPRIM) 100 MG tablet Take 100 mg by mouth daily.   Yes [provider]  dicyclomine (BENTYL) 10 MG capsule Take 1 capsule (10 mg total) by mouth 2 (two) times daily as needed. For abdominal pain or spasms. 06/23/22 06/23/23 Yes Letta Median, PA-C  gabapentin (NEURONTIN) 300 MG capsule Take 300 mg by mouth 2 (two) times daily. Occ takes more than twice a day   Yes [provider]  ketoconazole (NIZORAL) 2 % cream Apply twice daily to irritated belly button for 10 days or at least 2 days after rash resolves. 03/03/22  Yes Cattie Tineo K, DO  losartan (COZAAR) 25 MG tablet Take 25 mg by mouth daily.   Yes [provider]  Melatonin 5 MG TABS Take 10 mg by mouth at bedtime.   Yes [provider]  pantoprazole (PROTONIX) 40 MG tablet Take 1 tablet (40 mg total) by mouth 2 (two) times daily before a meal. 09/02/22  Yes Clearance Coots, Kristen S, PA-C  polyethylene glycol (MIRALAX / GLYCOLAX) packet Take 17 g by mouth daily.   Yes [provider]  simvastatin (ZOCOR) 20 MG tablet Take 20 mg by mouth daily.   Yes [provider]    Allergies as of  09/15/2022 - Review Complete 09/02/2022  Allergen Reaction Noted   Codeine Nausea And Vomiting 02/07/2014   Tramadol  06/30/2017    History reviewed. No pertinent family history.  Social History   Socioeconomic History   Marital status: Widowed    Spouse name: Not on file   Number of children: Not on file   Years of education: Not on file   Highest education level: Not on file  Occupational History   Not on file  Tobacco Use   Smoking status: Never    Passive exposure: Past   Smokeless tobacco: Never  Vaping Use   Vaping status: Never Used  Substance and Sexual  Activity   Alcohol use: No   Drug use: No   Sexual activity: Not Currently    Birth control/protection: Surgical  Other Topics Concern   Not on file  Social History Narrative   Not on file   Social Determinants of Health   Financial Resource Strain: Not on file  Food Insecurity: Not on file  Transportation Needs: Not on file  Physical Activity: Not on file  Stress: Not on file  Social Connections: Not on file  Intimate Partner Violence: Not on file    Review of Systems: General: Negative for fever, chills, fatigue, weakness. Eyes: Negative for vision changes.  ENT: Negative for hoarseness, difficulty swallowing , nasal congestion. CV: Negative for chest pain, angina, palpitations, dyspnea on exertion, peripheral edema.  Respiratory: Negative for dyspnea at rest, dyspnea on exertion, cough, sputum, wheezing.  GI: See history of present illness. GU:  Negative for dysuria, hematuria, urinary incontinence, urinary frequency, nocturnal urination.  MS: Negative for joint pain, low back pain.  Derm: Negative for rash or itching.  Neuro: Negative for weakness, abnormal sensation, seizure, frequent headaches, memory loss, confusion.  Psych: Negative for anxiety, depression Endo: Negative for unusual weight change.  Heme: Negative for bruising or bleeding. Allergy: Negative for rash or hives.  Physical Exam: Vital signs in last 24 hours: Temp:  [98.1 F (36.7 C)] 98.1 F (36.7 C) (09/23 0654) Pulse Rate:  [69] 69 (09/23 0654) Resp:  [16] 16 (09/23 0654) BP: (142)/(47) 142/47 (09/23 0654) SpO2:  [99 %] 99 % (09/23 0654) Weight:  [70.3 kg] 70.3 kg (09/23 0654)   General:   Alert,  Well-developed, well-nourished, pleasant and cooperative in NAD Head:  Normocephalic and atraumatic. Eyes:  Sclera clear, no icterus.   Conjunctiva pink. Ears:  Normal auditory acuity. Nose:  No deformity, discharge,  or lesions. Msk:  Symmetrical without gross deformities. Normal  posture. Extremities:  Without clubbing or edema. Neurologic:  Alert and  oriented x4;  grossly normal neurologically. Skin:  Intact without significant lesions or rashes. Psych:  Alert and cooperative. Normal mood and affect.   Impression/Plan: Olivia Powers is here for an EGD with possible dilation to be performed for epigastric pain, dysphagia   Risks, benefits, limitations, imponderables and alternatives regarding procedure have been reviewed with the patient. Questions have been answered. All parties agreeable.

## 2022-10-25 NOTE — Anesthesia Preprocedure Evaluation (Signed)
Anesthesia Evaluation  Patient identified by MRN, date of birth, ID band Patient awake    Reviewed: Allergy & Precautions, H&P , NPO status , Patient's Chart, lab work & pertinent test results, reviewed documented beta blocker date and time   Airway Mallampati: II  TM Distance: >3 FB Neck ROM: full    Dental no notable dental hx.    Pulmonary neg pulmonary ROS   Pulmonary exam normal breath sounds clear to auscultation       Cardiovascular Exercise Tolerance: Good hypertension, negative cardio ROS  Rhythm:regular Rate:Normal     Neuro/Psych  Neuromuscular disease negative neurological ROS  negative psych ROS   GI/Hepatic negative GI ROS, Neg liver ROS, hiatal hernia,,,  Endo/Other  negative endocrine ROSdiabetes    Renal/GU negative Renal ROS  negative genitourinary   Musculoskeletal   Abdominal   Peds  Hematology negative hematology ROS (+)   Anesthesia Other Findings   Reproductive/Obstetrics negative OB ROS                             Anesthesia Physical Anesthesia Plan  ASA: 2  Anesthesia Plan: General   Post-op Pain Management:    Induction:   PONV Risk Score and Plan: Propofol infusion  Airway Management Planned:   Additional Equipment:   Intra-op Plan:   Post-operative Plan:   Informed Consent: I have reviewed the patients History and Physical, chart, labs and discussed the procedure including the risks, benefits and alternatives for the proposed anesthesia with the patient or authorized representative who has indicated his/her understanding and acceptance.     Dental Advisory Given  Plan Discussed with: CRNA  Anesthesia Plan Comments:        Anesthesia Quick Evaluation

## 2022-10-26 LAB — SURGICAL PATHOLOGY

## 2022-11-02 ENCOUNTER — Encounter (HOSPITAL_COMMUNITY): Payer: Self-pay | Admitting: Internal Medicine

## 2022-11-02 ENCOUNTER — Telehealth: Payer: Self-pay

## 2022-11-02 NOTE — Telephone Encounter (Signed)
Phoned and advised the pt of the instructions for her swallowing and constipation. Pt expressed understanding. Pt has 2 boxes of Linzess left up front for her to pick up. Pt stated it will be tomorrow. Pt expressed understanding of ED visit.

## 2022-11-02 NOTE — Telephone Encounter (Signed)
Patient with recent upper endoscopy without esophageal stricture or stenosis.  Does have a large hiatal hernia which is likely contributing to her issues.  Continue current medications.   Recommend to avoid tough textures. All meats should be chopped finely. Eat slowly, take small bites, chew thoroughly, and drink plenty of liquids throughout meals.  If something were to get hung in your esophagus and not come up or go down, you should proceed to the emergency room.  Regards to her constipation, would recommend she increase her MiraLAX to twice daily and see how she does, if not improved then would offer samples of Linzess 145 mcg daily that she can pick up from the office (assuming we have some).  Linzess works best when taken once a day every day, on an empty stomach, at least 30 minutes before your first meal of the day.  When Linzess is taken daily as directed:  *Constipation relief is typically felt in about a week *IBS-C patients may begin to experience relief from belly pain and overall abdominal symptoms (pain, discomfort, and bloating) in about 1 week,   with symptoms typically improving over 12 weeks.  Diarrhea may occur in the first 2 weeks -keep taking it.  The diarrhea should go away and you should start having normal, complete, full bowel movements. It may be helpful to start treatment when you can be near the comfort of your own bathroom, such as a weekend.

## 2022-11-02 NOTE — Telephone Encounter (Signed)
Returned the pt's call and was advised by her that she really had a bad episode with food getting stuck in her throat last night and eventually it went down. She states that even cold get stuck in her throat. At first she didn't think her esophagus needed stretching but now she is not sure.  Also pt is still complaining of having trouble with some constipation. Advised I would have the nurse call back. Pt is wanting to speak with both providers even though she was advised you all were out of the office. Please advise

## 2022-11-03 NOTE — Telephone Encounter (Signed)
Spoke to pt, informed her to take 1 Linzess 30 minutes before breakfast and MiraLax. She voiced understanding. Informed to call next week with in update.

## 2022-11-03 NOTE — Telephone Encounter (Signed)
Courtney, I have no further recommendations. She should continue with recommendations provided by Dr. Marletta Lor.

## 2022-11-03 NOTE — Telephone Encounter (Signed)
LMOM for pt to call office  

## 2022-11-15 ENCOUNTER — Other Ambulatory Visit: Payer: Self-pay | Admitting: Gastroenterology

## 2022-11-15 ENCOUNTER — Telehealth: Payer: Self-pay

## 2022-11-15 ENCOUNTER — Telehealth: Payer: Self-pay | Admitting: *Deleted

## 2022-11-15 DIAGNOSIS — K5904 Chronic idiopathic constipation: Secondary | ICD-10-CM

## 2022-11-15 MED ORDER — LINACLOTIDE 145 MCG PO CAPS
145.0000 ug | ORAL_CAPSULE | Freq: Every day | ORAL | 5 refills | Status: DC
Start: 2022-11-15 — End: 2023-10-10

## 2022-11-15 NOTE — Telephone Encounter (Signed)
Spoke to pt, she informed me that Linzess is working well for her. She wants a prescription she to [facility] in [place]. She is also concerned about the hernia and would like to know what is going to be done.

## 2022-11-15 NOTE — Telephone Encounter (Signed)
Noted  

## 2022-11-15 NOTE — Telephone Encounter (Signed)
Pt phoned today. She had questions regarding the treatment of a hernia. She stated she really didn't know

## 2022-11-15 NOTE — Telephone Encounter (Signed)
See previous note

## 2022-11-15 NOTE — Telephone Encounter (Signed)
I will send in Rx for Linzess. Recommend OV for follow-up and to discuss hiatal hernia.

## 2022-11-18 ENCOUNTER — Ambulatory Visit: Payer: Medicare Other | Admitting: Gastroenterology

## 2022-11-18 ENCOUNTER — Encounter: Payer: Self-pay | Admitting: Gastroenterology

## 2022-11-18 VITALS — BP 129/60 | HR 74 | Temp 98.1°F | Ht <= 58 in | Wt 157.8 lb

## 2022-11-18 DIAGNOSIS — K5900 Constipation, unspecified: Secondary | ICD-10-CM | POA: Diagnosis not present

## 2022-11-18 DIAGNOSIS — R1013 Epigastric pain: Secondary | ICD-10-CM

## 2022-11-18 DIAGNOSIS — R1310 Dysphagia, unspecified: Secondary | ICD-10-CM | POA: Diagnosis not present

## 2022-11-18 DIAGNOSIS — K449 Diaphragmatic hernia without obstruction or gangrene: Secondary | ICD-10-CM | POA: Diagnosis not present

## 2022-11-18 DIAGNOSIS — R1010 Upper abdominal pain, unspecified: Secondary | ICD-10-CM

## 2022-11-18 DIAGNOSIS — Q399 Congenital malformation of esophagus, unspecified: Secondary | ICD-10-CM

## 2022-11-18 DIAGNOSIS — K5904 Chronic idiopathic constipation: Secondary | ICD-10-CM

## 2022-11-18 MED ORDER — PANTOPRAZOLE SODIUM 40 MG PO TBEC: 40.0000 mg | DELAYED_RELEASE_TABLET | Freq: Two times a day (BID) | ORAL | 3 refills | Status: AC

## 2022-11-18 MED ORDER — DICYCLOMINE HCL 10 MG PO CAPS: 10.0000 mg | ORAL_CAPSULE | Freq: Two times a day (BID) | ORAL | 5 refills | Status: DC | PRN

## 2022-11-18 NOTE — Progress Notes (Signed)
Referring Provider: Kirstie Peri, MD Primary Care Physician:  Kirstie Peri, MD Primary GI Physician: Dr. Marletta Lor  Chief Complaint  Patient presents with   Abdominal Pain    Stomach hurts and feels tight. Wants to know what can be done about the hernia. Food is sitting in her throat.      HPI:   Olivia Powers is a 82 y.o. female with a history of GERD, chronic dysphagia, esophageal dysmotility, large hiatal hernia, constipation with associated abdominal pain, presenting today for follow-up of postprandial epigastric abdominal pain, ongoing dysphagia, alternating constipation and diarrhea.  Last seen in the office 09/02/2022 reporting new onset postprandial epigastric abdominal pain for the last several weeks with associated early satiety, documented 8 pound weight loss since I had seen her in May.  Denied NSAIDs.  She is taking pantoprazole daily with no typical heartburn symptoms.  Recommended updating labs, increasing PPI to twice daily and proceeding with EGD for further evaluation.  Also with ongoing, chronic dysphagia felt to be multifactorial in setting of hiatal hernia, esophageal dysmotility.  Also with alternating constipation and diarrhea suspected to be secondary to baseline constipation with overflow diarrhea.  This was not adequately managed with MiraLAX and fiber twice weekly.  Recommended MiraLAX every day along with MiraLAX daily.   Labs completed 09/02/2022 with no significant abnormalities on CBC or CMP.  EGD 10/25/2022: Tortuous esophagus, large hiatal hernia with few small Cameron erosions without ulceration biopsied, otherwise normal exam.  Recommended PPI twice daily, avoid NSAIDs.  Pathology with gastric oxyntic mucosa without significant diagnostic alteration, no H. pylori.  Prior BPE 12/06/2019 with moderate esophageal dysmotility with esophagus appearing minimally dilated and hypotonic, large hiatal hernia, laryngeal penetration without aspiration.   Today:  Intermittent  episodes of upper abdominal pain described as pressure with her stomach feeling more firm. Slight nausea but no vomiting.  Discomfort may last about 1 hour before going away on their own.  Symptoms usually occur in the evening or at night. Not specifically after eating. No specific triggers. Doesn't seem to be associated with bowel movements.  Takes dicyclomine sometimes.  Sometimes helps and sometimes it does not.  No regular heartburn symptoms.  She is only taking pantoprazole 40 mg once daily.  States that she was not aware that she was supposed to take it twice a day.  No NSAIDs.   Reports Linzess 145 mcg is going to be too expensive for her as it cost $30 for 1 month supply.  This is a lot as she has other medication she has to buy.  She is currently taking MiraLAX once a day and may skip 2 or 3 days without a bowel movement.  No BRBPR or melena.  States that she forgot that she used to take a fiber pill and this actually did allow her to have a bowel movement every day when she took it with MiraLAX.  She prefers to try this again.  Past Medical History:  Diagnosis Date   Alopecia    Arthritis    Cancer (HCC)    skin cancer of chest melanoma   Diabetes mellitus without complication (HCC)    taken off Metformin by PCP this year.   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   Hypercholesteremia    Hypertension     Past Surgical History:  Procedure Laterality Date   ABDOMINAL HYSTERECTOMY     BACK SURGERY  lower   BIOPSY  10/25/2022   Procedure: BIOPSY;  Surgeon: Lanelle Bal, DO;  Location: [facility];  Service: Endoscopy;;   CATARACT EXTRACTION W/PHACO Left 02/11/2014   Procedure: CATARACT EXTRACTION PHACO AND INTRAOCULAR LENS PLACEMENT LEFT EYE;  Surgeon: Gemma Payor, MD;  Location: [facility];  Service: Ophthalmology;  Laterality: Left;  CDE:4.93   CATARACT EXTRACTION W/PHACO Right 03/11/2014   Procedure: CATARACT EXTRACTION PHACO AND  INTRAOCULAR LENS PLACEMENT RIGHT EYE CDE=8.52;  Surgeon: Gemma Payor, MD;  Location: [facility];  Service: Ophthalmology;  Laterality: Right;   CERVICAL DISC SURGERY     CESAREAN SECTION     x2   CRANIECTOMY FOR EXCISION OF ACOUSTIC NEUROMA Left 1990, 1992   ESOPHAGOGASTRODUODENOSCOPY N/A 04/22/2014   Procedure: ESOPHAGOGASTRODUODENOSCOPY (EGD);  Surgeon: Rachael Fee, MD;  Location[facility];  Service: Endoscopy;  Laterality: N/A;   ESOPHAGOGASTRODUODENOSCOPY (EGD) WITH PROPOFOL N/A 10/25/2022   Procedure: ESOPHAGOGASTRODUODENOSCOPY (EGD) WITH PROPOFOL;  Surgeon: Lanelle Bal, DO;  Location: [facility];  Service: Endoscopy;  Laterality: N/A;  8:15 am, asa 3   TOTAL HIP ARTHROPLASTY Left 07/06/2017   Procedure: LEFT TOTAL HIP ARTHROPLASTY ANTERIOR APPROACH;  Surgeon: Ollen Gross, MD;  Location: [facility];  Service: Orthopedics;  Laterality: Left;  Failed Spinal   TOTAL HIP ARTHROPLASTY Right 08/23/2018   Procedure: TOTAL HIP ARTHROPLASTY ANTERIOR APPROACH;  Surgeon: Ollen Gross, MD;  Location: [facility];  Service: Orthopedics;  Laterality: Right;    TOTAL KNEE ARTHROPLASTY Bilateral 1990    Current Outpatient Medications  Medication Sig Dispense Refill   acetaminophen (TYLENOL) 500 MG tablet Take 1,000 mg by mouth every 6 (six) hours as needed for moderate pain or headache.      allopurinol (ZYLOPRIM) 100 MG tablet Take 100 mg by mouth daily.     gabapentin (NEURONTIN) 300 MG capsule Take 300 mg by mouth 2 (two) times daily. Occ takes more than twice a day     ketoconazole (NIZORAL) 2 % cream Apply twice daily to irritated belly button for 10 days or at least 2 days after rash resolves. 30 g 3   linaclotide (LINZESS) 145 MCG CAPS capsule Take 1 capsule (145 mcg total) by mouth daily before breakfast. 30 capsule 5   losartan (COZAAR) 25 MG tablet Take 25 mg by mouth daily.     Melatonin 5 MG TABS Take 10 mg by mouth at bedtime.     polyethylene glycol (MIRALAX / GLYCOLAX) packet  Take 17 g by mouth daily.     simvastatin (ZOCOR) 20 MG tablet Take 20 mg by mouth daily.     dicyclomine (BENTYL) 10 MG capsule Take 1 capsule (10 mg total) by mouth 2 (two) times daily as needed. For abdominal pain or spasms. 60 capsule 5   pantoprazole (PROTONIX) 40 MG tablet Take 1 tablet (40 mg total) by mouth 2 (two) times daily before a meal. 60 tablet 3   No current facility-administered medications for this visit.    Allergies as of 11/18/2022 - Review Complete 11/18/2022  Allergen Reaction Noted   Codeine Nausea And Vomiting 02/07/2014   Tramadol  06/30/2017    History reviewed. No pertinent family history.  Social History   Socioeconomic History   Marital status: Widowed    Spouse name: Not on file   Number of children: Not on file   Years of education: Not on file   Highest education level: Not on file  Occupational History   Not on file  Tobacco Use   Smoking status: Never    Passive exposure: Past   Smokeless tobacco: Never  Vaping Use   Vaping status: Never Used  Substance and Sexual Activity   Alcohol use: No   Drug use: No   Sexual activity: Not Currently    Birth control/protection: Surgical  Other Topics Concern   Not on file  Social History Narrative   Not on file   Social Determinants of Health   Financial Resource Strain: Not on file  Food Insecurity: Not on file  Transportation Needs: Not on file  Physical Activity: Not on file  Stress: Not on file  Social Connections: Not on file    Review of Systems: Gen: Denies fever, chills, cold or flulike symptoms, presyncope, syncope. CV: Denies chest pain, palpitations. Resp: Denies dyspnea, cough. GI: See HPI Heme: See HPI  Physical Exam: BP 129/60 (BP Location: Right Arm, Patient Position: Sitting, Cuff Size: Normal)   Pulse 74   Temp 98.1 F (36.7 C) (Temporal)   Ht 4\' 10"  (1.473 m)   Wt 157 lb 12.8 oz (71.6 kg)   BMI 32.98 kg/m  General:   Alert and oriented. No distress noted.  Pleasant and cooperative.  Head:  Normocephalic and atraumatic. Eyes:  Conjuctiva clear without scleral icterus. Heart:  S1, S2 present without murmurs appreciated. Lungs:  Clear to auscultation bilaterally. No wheezes, rales, or rhonchi. No distress.  Abdomen:  +BS, soft, and non-distended.  Mild TTP in epigastric area.  No rebound or guarding. No HSM or masses noted. Msk:  Symmetrical without gross deformities. Normal posture. Extremities:  Without edema. Neurologic:  Alert and  oriented x4 Psych:  Normal mood and affect.    Assessment:  82 y.o. female with a history of GERD, chronic dysphagia, esophageal dysmotility, large hiatal hernia, constipation with associated abdominal pain, presenting today for follow-up of epigastric abdominal pain, dysphagia, and constipation.   Epigastric abdominal pain:  Intermittent epigastric abdominal pressure without identified trigger for the last few months.  Recent EGD 10/25/2022 with large hiatal hernia with few small Cameron erosions without ulceration s/p biopsy with benign pathology.  This very well could be the etiology of her intermittent discomfort though unable to rule out other causes such as biliary etiology.  Notably, labs in August for the same symptoms showed no abnormalities on CBC or CMP.  Had previously advised to increase pantoprazole to twice daily, but has continued to take once daily.  We discussed increasing PPI as well as obtaining RUQ ultrasound, patient would like to start with medication changes first.  We did discuss management of hiatal hernia being surgical.  Patient states she would not be interested in surgery at her age.  Additionally, this would likely worsen her chronic dysphagia.  Dysphagia: Chronic. Likely multifactorial in setting of hiatal hernia, esophageal dysmotility, and esophageal tortuosity noted on recent EGD in September.  Patient is currently managing fairly well with dietary adjustments.   Constipation: Not  adequately managed with MiraLAX 17 g daily.  Recently prescribed Linzess, but unable to afford this ($30 per month).  I suspect all prescriptive agents will likely be around the same cost if not more expensive.  For now, patient would like to try adding fiber to daily MiraLAX as she remembered this previously helped her quite a bit.  If this is not helpful, she can try increasing MiraLAX to twice a day.   Plan:  Increase pantoprazole to 40 mg twice daily 30 minutes before breakfast and dinner. Dietary/lifestyle  recommendations: Avoid fried, fatty, greasy, spicy, citrus foods. Avoid caffeine and carbonated beverages. Avoid chocolate. Try eating 4-6 small meals a day rather than 3 large meals. Do not eat within 3 hours of laying down. Prop head of bed up on wood or bricks to create a 6 inch incline. Eat slowly, take small bites, chew thoroughly, drink plenty of liquids throughout meals.  Avoid trough textures All meats should be chopped finely.  Continue MiraLAX 17 g daily.  May increase to twice daily if needed. Resume fiber daily. Follow-up in 8 weeks. Consider RUQ [place] if no improvement in epigastric pain with increasing PPI to twice daily.    Ermalinda Memos, PA-C [facility] 11/18/2022

## 2022-11-18 NOTE — Patient Instructions (Signed)
For upper abdominal pain:  Start taking pantoprazole 40 mg twice daily 30 minutes before breakfast and 30 minutes before dinner.  Avoid fried, fatty, greasy, spicy, citrus foods. Avoid caffeine and carbonated beverages. Avoid chocolate. Try eating 4-6 small meals a day rather than 3 large meals. Do not eat within 3 hours of laying down. Prop head of bed up on wood or bricks to create a 6 inch incline.   As we discussed, your swallowing problems are secondary to esophageal dysmotility meaning the muscles in your esophagus are not contracting normally to push the food down to your stomach.  Your large hiatal hernia is also likely influencing this.  I recommend supportive management with the following:  Eat slowly, take small bites, chew thoroughly, drink plenty of liquids throughout meals.  Avoid trough textures All meats should be chopped finely.  Small frequent meals.    For constipation: Continue MiraLAX 1 capful daily. Resume fiber tablet daily.  I will plan to see back in the office in 8 weeks to follow-up on upper abdominal pain.  Please let me know if any worsening symptoms between now and the next time I see you.  Ermalinda Memos, PA-C [facility]

## 2023-01-13 ENCOUNTER — Ambulatory Visit: Payer: Medicare Other | Admitting: Internal Medicine

## 2023-01-13 VITALS — BP 130/77 | HR 73 | Temp 97.9°F | Ht 60.0 in | Wt 154.2 lb

## 2023-01-13 DIAGNOSIS — K449 Diaphragmatic hernia without obstruction or gangrene: Secondary | ICD-10-CM

## 2023-01-13 DIAGNOSIS — K5909 Other constipation: Secondary | ICD-10-CM | POA: Diagnosis not present

## 2023-01-13 DIAGNOSIS — R1310 Dysphagia, unspecified: Secondary | ICD-10-CM

## 2023-01-13 DIAGNOSIS — K219 Gastro-esophageal reflux disease without esophagitis: Secondary | ICD-10-CM | POA: Diagnosis not present

## 2023-01-13 DIAGNOSIS — K5904 Chronic idiopathic constipation: Secondary | ICD-10-CM

## 2023-01-13 DIAGNOSIS — R1319 Other dysphagia: Secondary | ICD-10-CM

## 2023-01-13 NOTE — Patient Instructions (Signed)
I am happy that you are doing well.  Continue on pantoprazole twice daily for your acid reflux and hiatal hernia.  For your constipation continue on Colace daily.  You may want to consider adding once a day Dulcolax (bisacodyl) instead of the MiraLAX and see how you do.  If you develop abdominal cramping then just go back to the MiraLAX with the Colace.  Follow-up in 3 to 4 months.  It was very nice seeing you today.  Hope you have a fantastic Christmas.  Dr. Marletta Lor

## 2023-01-13 NOTE — Progress Notes (Signed)
Referring Provider: Kirstie Peri, MD Primary Care Physician:  Kirstie Peri, MD Primary GI:  Dr. Marletta Lor  Chief Complaint  Patient presents with   Follow-up    Follow up with [facility]. Pt states her little red pill is helping with constipation    HPI:   Olivia Powers is a 82 y.o. female who presents to clinic today for follow-up visit.  Chronic constipation: Currently taking MiraLAX and Colace daily.  Improvement in symptoms.  Was given samples of Linzess 145 mcg daily which worked great though she states this was too expensive.  Co-pay would be $30 a month.  Chronic GERD, hiatal hernia, esophageal dysphagia: Currently taking pantoprazole twice daily.  Symptoms relatively well-controlled.  Past Medical History:  Diagnosis Date   Alopecia    Arthritis    Cancer (HCC)    skin cancer of chest melanoma   Diabetes mellitus without complication (HCC)    taken off Metformin by PCP this year.   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   Hypercholesteremia    Hypertension     Past Surgical History:  Procedure Laterality Date   ABDOMINAL HYSTERECTOMY     BACK SURGERY     lower   BIOPSY  10/25/2022   Procedure: BIOPSY;  Surgeon: Lanelle Bal, DO;  Location: [facility];  Service: Endoscopy;;   CATARACT EXTRACTION W/PHACO Left 02/11/2014   Procedure: CATARACT EXTRACTION PHACO AND INTRAOCULAR LENS PLACEMENT LEFT EYE;  Surgeon: Gemma Payor, MD;  Location: [facility];  Service: Ophthalmology;  Laterality: Left;  CDE:4.93   CATARACT EXTRACTION W/PHACO Right 03/11/2014   Procedure: CATARACT EXTRACTION PHACO AND INTRAOCULAR LENS PLACEMENT RIGHT EYE CDE=8.52;  Surgeon: Gemma Payor, MD;  Location: [facility];  Service: Ophthalmology;  Laterality: Right;   CERVICAL DISC SURGERY     CESAREAN SECTION     x2   CRANIECTOMY FOR EXCISION OF ACOUSTIC NEUROMA Left 1990, 1992   ESOPHAGOGASTRODUODENOSCOPY N/A 04/22/2014   Procedure: ESOPHAGOGASTRODUODENOSCOPY (EGD);   Surgeon: Rachael Fee, MD;  Location: [facility];  Service: Endoscopy;  Laterality: N/A;   ESOPHAGOGASTRODUODENOSCOPY (EGD) WITH PROPOFOL N/A 10/25/2022   Procedure: ESOPHAGOGASTRODUODENOSCOPY (EGD) WITH PROPOFOL;  Surgeon: Lanelle Bal, DO;  Location: [facility];  Service: Endoscopy;  Laterality: N/A;  8:15 am, asa 3   TOTAL HIP ARTHROPLASTY Left 07/06/2017   Procedure: LEFT TOTAL HIP ARTHROPLASTY ANTERIOR APPROACH;  Surgeon: Ollen Gross, MD;  Location: [facility];  Service: Orthopedics;  Laterality: Left;  Failed Spinal   TOTAL HIP ARTHROPLASTY Right 08/23/2018   Procedure: TOTAL HIP ARTHROPLASTY ANTERIOR APPROACH;  Surgeon: Ollen Gross, MD;  Location: [facility];  Service: Orthopedics;  Laterality: Right;    TOTAL KNEE ARTHROPLASTY Bilateral 1990    Current Outpatient Medications  Medication Sig Dispense Refill   acetaminophen (TYLENOL) 500 MG tablet Take 1,000 mg by mouth every 6 (six) hours as needed for moderate pain or headache.      allopurinol (ZYLOPRIM) 100 MG tablet Take 100 mg by mouth daily.     dicyclomine (BENTYL) 10 MG capsule Take 1 capsule (10 mg total) by mouth 2 (two) times daily as needed. For abdominal pain or spasms. 60 capsule 5   gabapentin (NEURONTIN) 300 MG capsule Take 300 mg by mouth 2 (two) times daily. Occ takes more than twice a day     ketoconazole (NIZORAL) 2 % cream Apply twice daily to irritated belly button for 10 days  or at least 2 days after rash resolves. 30 g 3   linaclotide (LINZESS) 145 MCG CAPS capsule Take 1 capsule (145 mcg total) by mouth daily before breakfast. 30 capsule 5   losartan (COZAAR) 25 MG tablet Take 25 mg by mouth daily.     Melatonin 5 MG TABS Take 10 mg by mouth at bedtime.     pantoprazole (PROTONIX) 40 MG tablet Take 1 tablet (40 mg total) by mouth 2 (two) times daily before a meal. 60 tablet 3   polyethylene glycol (MIRALAX / GLYCOLAX) packet Take 17 g by mouth daily.     simvastatin (ZOCOR) 20 MG tablet Take 20 mg  by mouth daily.     No current facility-administered medications for this visit.    Allergies as of 01/13/2023 - Review Complete 01/13/2023  Allergen Reaction Noted   Codeine Nausea And Vomiting 02/07/2014   Tramadol  06/30/2017    No family history on file.  Social History   Socioeconomic History   Marital status: Widowed    Spouse name: Not on file   Number of children: Not on file   Years of education: Not on file   Highest education level: Not on file  Occupational History   Not on file  Tobacco Use   Smoking status: Never    Passive exposure: Past   Smokeless tobacco: Never  Vaping Use   Vaping status: Never Used  Substance and Sexual Activity   Alcohol use: No   Drug use: No   Sexual activity: Not Currently    Birth control/protection: Surgical  Other Topics Concern   Not on file  Social History Narrative   Not on file   Social Drivers of Health   Financial Resource Strain: Not on file  Food Insecurity: Not on file  Transportation Needs: Not on file  Physical Activity: Not on file  Stress: Not on file  Social Connections: Not on file    Subjective: Review of Systems  Constitutional:  Negative for chills and fever.  HENT:  Negative for congestion and hearing loss.   Eyes:  Negative for blurred vision and double vision.  Respiratory:  Negative for cough and shortness of breath.   Cardiovascular:  Negative for chest pain and palpitations.  Gastrointestinal:  Positive for constipation and heartburn. Negative for abdominal pain, blood in stool, diarrhea, melena and vomiting.  Genitourinary:  Negative for dysuria and urgency.  Musculoskeletal:  Negative for joint pain and myalgias.  Skin:  Negative for itching and rash.  Neurological:  Negative for dizziness and headaches.  Psychiatric/Behavioral:  Negative for depression. The patient is not nervous/anxious.      Objective: BP 130/77   Pulse 73   Temp 97.9 F (36.6 C)   Ht 5' (1.524 m)   Wt 154  lb 3.2 oz (69.9 kg)   BMI 30.12 kg/m  Physical Exam Constitutional:      Appearance: Normal appearance.  HENT:     Head: Normocephalic and atraumatic.  Eyes:     Extraocular Movements: Extraocular movements intact.     Conjunctiva/sclera: Conjunctivae normal.  Cardiovascular:     Rate and Rhythm: Normal rate and regular rhythm.  Pulmonary:     Effort: Pulmonary effort is normal.     Breath sounds: Normal breath sounds.  Abdominal:     General: Bowel sounds are normal.     Palpations: Abdomen is soft.  Musculoskeletal:        General: No swelling. Normal range of motion.  Cervical back: Normal range of motion and neck supple.  Skin:    General: Skin is warm and dry.     Coloration: Skin is not jaundiced.  Neurological:     General: No focal deficit present.     Mental Status: She is alert and oriented to person, place, and time.  Psychiatric:        Mood and Affect: Mood normal.        Behavior: Behavior normal.      Assessment/Plan:  1.  Chronic constipation- Currently taking MiraLAX and Colace daily.  Improvement in symptoms.  Was given samples of Linzess 145 mcg daily which worked great though she states this was too expensive.  Co-pay would be $30 a month.  Consider trial of Dulcolax instead of MiraLAX and see how she does.  2.  Chronic GERD, hiatal hernia, esophageal dysphagia-not a surgical candidate for hiatal hernia repair given her age and comorbidities.  Continue on pantoprazole twice daily.  Patient to avoid tough textures. All meats should be chopped finely. Eat slowly, take small bites, chew thoroughly, and drink plenty of liquids throughout meals. Advised if something were to get hung in patient's esophagus and not come up or go down, they should proceed to the emergency room.  Follow up in 3-4 months 01/13/2023 10:38 AM   Disclaimer: This note was dictated with voice recognition software. Similar sounding words can inadvertently be transcribed and may  not be corrected upon review.

## 2023-03-14 ENCOUNTER — Telehealth: Payer: Self-pay

## 2023-03-14 NOTE — Telephone Encounter (Signed)
 Pt called back and was advised by her that she is very constipated and her Miralax  is and colace is not working. Pt has not been taking her Linzess  because she for got what pharmacy it was at. I advised her where it was sent to and the pt is going to pick it up today and she has appt for the am

## 2023-03-14 NOTE — Telephone Encounter (Signed)
 Returned the pt's call regarding her constipation. No ans/vm to leave a vm

## 2023-03-14 NOTE — Progress Notes (Signed)
GI Office Note    Referring Provider: Kirstie Peri, MD Primary Care Physician:  Kirstie Peri, MD  Primary Gastroenterologist: Hennie Duos. Marletta Lor, DO   Chief Complaint   Chief Complaint  Patient presents with   Constipation    Pt had been constipated for awhile. Spoke with her yesterday and advised her to go get her Linzess. Pt has had a BM last night    History of Present Illness   Olivia Powers is a 83 y.o. female presenting today for urgent office visit regarding her bowels. Last seen 01/2023 for chronic constipation, chronic GERD.  Was on colace and miralax at last ov. Advised to consider using bisacodyl instead of miralax, taking once daily. Samples of Linzess  previously given and worked great but with copay of $30 was too expensive.   Patient states that she had been taking MiraLAX once daily.  Has been on it for years.  Has used Colace along with it in the past, she does not believe that she got bisacodyl after her last office visit.  She may have picked up some sort of Dulcolax product possibly a stool softener as she states it did not move her.  Recently was having increasing difficulty having a bowel movement.  Passing very small stools, then passing basically liquid but feeling like her rectum was full.  Sometimes stools pencil thin.  Over the last couple days she really was straining to have a bowel movement.  Flared her hemorrhoids and had to get some hemorrhoid OTC medication.  Was having abdominal pain yesterday although this is much better today after finally having a bowel movement.  After taking Linzess yesterday she had multiple stools, passed significant amount of waste.  Abdominal pain and rectal discomfort much improved.   Colonoscopy about 13 years ago per patient.  EGD 10/2022: -tortuous esophagus -large hiatal hernia s/p bx, no h.pylori  Medications   Current Outpatient Medications  Medication Sig Dispense Refill   acetaminophen (TYLENOL) 500 MG  tablet Take 1,000 mg by mouth every 6 (six) hours as needed for moderate pain or headache.      allopurinol (ZYLOPRIM) 100 MG tablet Take 100 mg by mouth daily.     dicyclomine (BENTYL) 10 MG capsule Take 1 capsule (10 mg total) by mouth 2 (two) times daily as needed. For abdominal pain or spasms. 60 capsule 5   gabapentin (NEURONTIN) 300 MG capsule Take 300 mg by mouth 2 (two) times daily. Occ takes more than twice a day     ketoconazole (NIZORAL) 2 % cream Apply twice daily to irritated belly button for 10 days or at least 2 days after rash resolves. 30 g 3   linaclotide (LINZESS) 145 MCG CAPS capsule Take 1 capsule (145 mcg total) by mouth daily before breakfast. 30 capsule 5   losartan (COZAAR) 25 MG tablet Take 25 mg by mouth daily.     Melatonin 5 MG TABS Take 10 mg by mouth at bedtime.     pantoprazole (PROTONIX) 40 MG tablet Take 1 tablet (40 mg total) by mouth 2 (two) times daily before a meal. 60 tablet 3   polyethylene glycol (MIRALAX / GLYCOLAX) packet Take 17 g by mouth daily.     simvastatin (ZOCOR) 20 MG tablet Take 20 mg by mouth daily.     No current facility-administered medications for this visit.    Allergies   Allergies as of 03/15/2023 - Review Complete 03/15/2023  Allergen Reaction Noted   Codeine  Nausea And Vomiting 02/07/2014   Tramadol  06/30/2017     Review of Systems   General: Negative for anorexia, weight loss, fever, chills, fatigue, weakness. ENT: Negative for hoarseness, difficulty swallowing, nasal congestion. CV: Negative for chest pain, angina, palpitations, dyspnea on exertion, peripheral edema.  Respiratory: Negative for dyspnea at rest, dyspnea on exertion, cough, sputum, wheezing.  GI: See history of present illness. GU:  Negative for dysuria, hematuria, urinary incontinence, urinary frequency, nocturnal urination.  Endo: Negative for unusual weight change.     Physical Exam   BP 130/76   Pulse 76   Temp 98.7 F (37.1 C)   Ht 4\' 10"   (1.473 m)   Wt 153 lb (69.4 kg)   BMI 31.98 kg/m    General: Well-nourished, well-developed in no acute distress.  Eyes: No icterus. Mouth: Oropharyngeal mucosa moist and pink   Abdomen: Bowel sounds are normal,  nondistended, very soft. no hepatosplenomegaly or masses,  no abdominal bruits or hernia , no rebound or guarding. Mild lower abdominal tenderness Rectal: not performed  Extremities: No lower extremity edema. No clubbing or deformities. Neuro: Alert and oriented x 4   Skin: Warm and dry, no jaundice.   Psych: Alert and cooperative, normal mood and affect.  Labs   Lab Results  Component Value Date   NA 141 09/02/2022   CL 103 09/02/2022   K 4.5 09/02/2022   CO2 25 09/02/2022   BUN 15 09/02/2022   CREATININE 0.99 (H) 09/02/2022   EGFR 56 (L) 09/02/2022   CALCIUM 10.1 09/02/2022   ALBUMIN 4.3 08/21/2018   GLUCOSE 93 09/02/2022   Lab Results  Component Value Date   ALT 8 09/02/2022   AST 15 09/02/2022   ALKPHOS 76 08/21/2018   BILITOT 0.6 09/02/2022   Lab Results  Component Value Date   WBC 6.7 09/02/2022   HGB 14.2 09/02/2022   HCT 44.0 09/02/2022   MCV 89.6 09/02/2022   PLT 299 09/02/2022    Imaging Studies   No results found.  Assessment/Plan:   Constipation: -recently with increasing difficulty moving bowels, suspect overflow diarrhea -taking miralax once daily and sometimes colace. Does not sound as if she switched over to bisacodyl as suggested at last ov. -at this time will increase miralax to twice daily every other day, alternating with once daily.  -she will take Linzess once to twice weekly to stretch out her RX -if her abdominal pain does not resolve, she will let me know, consider imaging at that time -if bowel regimen not effective she will let me know  -Return office visit in 6 to 8 weeks otherwise    Leanna Battles. Melvyn Neth, MHS, PA-C [facility]

## 2023-03-15 ENCOUNTER — Ambulatory Visit: Payer: Medicare Other | Admitting: Gastroenterology

## 2023-03-15 ENCOUNTER — Encounter: Payer: Self-pay | Admitting: Gastroenterology

## 2023-03-15 VITALS — BP 130/76 | HR 76 | Temp 98.7°F | Ht <= 58 in | Wt 153.0 lb

## 2023-03-15 DIAGNOSIS — K5900 Constipation, unspecified: Secondary | ICD-10-CM

## 2023-03-15 NOTE — Patient Instructions (Addendum)
Change MiraLAX.  Every other day increase dose to 2 capfuls daily.  On other days take 1 capful daily. Continue Linzess 145 mcg on an empty stomach daily as needed.  You may be able to get by with taking this only 1-2 times per week in order to stretch out your prescription. If this regimen is not adequate to maintain regular soft stools, please call the office at [phone]. If your abdominal pain does not continue to improve, please let me know when we would consider x-rays. Return to the office in 6 to 8 weeks for follow

## 2023-04-27 ENCOUNTER — Encounter: Payer: Self-pay | Admitting: Internal Medicine

## 2023-04-27 ENCOUNTER — Ambulatory Visit (INDEPENDENT_AMBULATORY_CARE_PROVIDER_SITE_OTHER): Payer: Medicare Other | Admitting: Internal Medicine

## 2023-04-27 VITALS — BP 136/88 | HR 76 | Temp 97.8°F | Ht <= 58 in | Wt 142.2 lb

## 2023-04-27 DIAGNOSIS — K449 Diaphragmatic hernia without obstruction or gangrene: Secondary | ICD-10-CM

## 2023-04-27 DIAGNOSIS — K5909 Other constipation: Secondary | ICD-10-CM

## 2023-04-27 DIAGNOSIS — R1319 Other dysphagia: Secondary | ICD-10-CM

## 2023-04-27 DIAGNOSIS — K219 Gastro-esophageal reflux disease without esophagitis: Secondary | ICD-10-CM | POA: Diagnosis not present

## 2023-04-27 DIAGNOSIS — R1310 Dysphagia, unspecified: Secondary | ICD-10-CM | POA: Diagnosis not present

## 2023-04-27 DIAGNOSIS — K5904 Chronic idiopathic constipation: Secondary | ICD-10-CM

## 2023-04-27 NOTE — Patient Instructions (Addendum)
  I am happy that you are doing well.  Continue on pantoprazole twice daily for your acid reflux and hiatal hernia.   For your constipation continue on Miralax daily.  Continue on Linzess 2x weekly.    Follow-up in 6 months.   It was very nice seeing you today.   Dr. Marletta Lor

## 2023-04-27 NOTE — Progress Notes (Signed)
 Referring Provider: Kirstie Peri, MD Primary Care Physician:  Kirstie Peri, MD Primary GI:  Dr. Marletta Lor  Chief Complaint  Patient presents with   Follow-up    Patient here today for a follow up on her issues with constipation. She is taking Linzess 145 mcg two times a week. She occasionally she will take a stool softener. She also takes Miralax daily.      HPI:   Olivia Powers is a 83 y.o. female who presents to clinic today for follow-up visit.  Chronic constipation: Currently taking MiraLAX daily and Linzess 145 mcg 2x weekly.  Improvement in symptoms.  Previous abdominal pain has resolved.   Chronic GERD, hiatal hernia, esophageal dysphagia: Currently taking pantoprazole twice daily.  Symptoms relatively well-controlled.  Past Medical History:  Diagnosis Date   Alopecia    Arthritis    Cancer (HCC)    skin cancer of chest melanoma   Diabetes mellitus without complication (HCC)    taken off Metformin by PCP this year.   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   Hypercholesteremia    Hypertension     Past Surgical History:  Procedure Laterality Date   ABDOMINAL HYSTERECTOMY     BACK SURGERY     lower   BIOPSY  10/25/2022   Procedure: BIOPSY;  Surgeon: Lanelle Bal, DO;  [facility];  Service: Endoscopy;;   CATARACT EXTRACTION W/PHACO Left 02/11/2014   Procedure: CATARACT EXTRACTION PHACO AND INTRAOCULAR LENS PLACEMENT LEFT EYE;  Surgeon: Gemma Payor, MD;  Location: [facility];  Service: Ophthalmology;  Laterality: Left;  CDE:4.93   CATARACT EXTRACTION W/PHACO Right 03/11/2014   Procedure: CATARACT EXTRACTION PHACO AND INTRAOCULAR LENS PLACEMENT RIGHT EYE CDE=8.52;  Surgeon: Gemma Payor, MD;  Location: [facility];  Service: Ophthalmology;  Laterality: Right;   CERVICAL DISC SURGERY     CESAREAN SECTION     x2   CRANIECTOMY FOR EXCISION OF ACOUSTIC NEUROMA Left 1990, 1992   ESOPHAGOGASTRODUODENOSCOPY N/A 04/22/2014    Procedure: ESOPHAGOGASTRODUODENOSCOPY (EGD);  Surgeon: Rachael Fee, MD;  Location: [facility];  Service: Endoscopy;  Laterality: N/A;   ESOPHAGOGASTRODUODENOSCOPY (EGD) WITH PROPOFOL N/A 10/25/2022   Procedure: ESOPHAGOGASTRODUODENOSCOPY (EGD) WITH PROPOFOL;  Surgeon: Lanelle Bal, DO;  [facility];  Service: Endoscopy;  Laterality: N/A;  8:15 am, asa 3   TOTAL HIP ARTHROPLASTY Left 07/06/2017   Procedure: LEFT TOTAL HIP ARTHROPLASTY ANTERIOR APPROACH;  Surgeon: Ollen Gross, MD;  Location: [facility];  Service: Orthopedics;  Laterality: Left;  Failed Spinal   TOTAL HIP ARTHROPLASTY Right 08/23/2018   Procedure: TOTAL HIP ARTHROPLASTY ANTERIOR APPROACH;  Surgeon: Ollen Gross, MD;  Location: [facility];  Service: Orthopedics;  Laterality: Right;    TOTAL KNEE ARTHROPLASTY Bilateral 1990    Current Outpatient Medications  Medication Sig Dispense Refill   acetaminophen (TYLENOL) 500 MG tablet Take 1,000 mg by mouth every 6 (six) hours as needed for moderate pain or headache.      dicyclomine (BENTYL) 10 MG capsule Take 1 capsule (10 mg total) by mouth 2 (two) times daily as needed. For abdominal pain or spasms. 60 capsule 5   gabapentin (NEURONTIN) 300 MG capsule Take 300 mg by mouth 2 (two) times daily. Occ takes more than twice a day     ketoconazole (NIZORAL) 2 % cream Apply twice daily to irritated belly button for 10 days or at least 2 days after rash  resolves. 30 g 3   linaclotide (LINZESS) 145 MCG CAPS capsule Take 1 capsule (145 mcg total) by mouth daily before breakfast. 30 capsule 5   losartan (COZAAR) 25 MG tablet Take 25 mg by mouth daily.     Melatonin 5 MG TABS Take 10 mg by mouth at bedtime.     pantoprazole (PROTONIX) 40 MG tablet Take 1 tablet (40 mg total) by mouth 2 (two) times daily before a meal. 60 tablet 3   polyethylene glycol (MIRALAX / GLYCOLAX) packet Take 17 g by mouth as directed. Take one capful once to twice daily, consider taking two capfuls  every other day     simvastatin (ZOCOR) 20 MG tablet Take 20 mg by mouth daily.     allopurinol (ZYLOPRIM) 100 MG tablet Take 100 mg by mouth daily.     No current facility-administered medications for this visit.    Allergies as of 04/27/2023 - Review Complete 04/27/2023  Allergen Reaction Noted   Codeine Nausea And Vomiting 02/07/2014   Tramadol  06/30/2017    History reviewed. No pertinent family history.  Social History   Socioeconomic History   Marital status: Widowed    Spouse name: Not on file   Number of children: Not on file   Years of education: Not on file   Highest education level: Not on file  Occupational History   Not on file  Tobacco Use   Smoking status: Never    Passive exposure: Past   Smokeless tobacco: Never  Vaping Use   Vaping status: Never Used  Substance and Sexual Activity   Alcohol use: No   Drug use: No   Sexual activity: Not Currently    Birth control/protection: Surgical  Other Topics Concern   Not on file  Social History Narrative   Not on file   Social Drivers of Health   Financial Resource Strain: Not on file  Food Insecurity: Not on file  Transportation Needs: Not on file  Physical Activity: Not on file  Stress: Not on file  Social Connections: Not on file    Subjective: Review of Systems  Constitutional:  Negative for chills and fever.  HENT:  Negative for congestion and hearing loss.   Eyes:  Negative for blurred vision and double vision.  Respiratory:  Negative for cough and shortness of breath.   Cardiovascular:  Negative for chest pain and palpitations.  Gastrointestinal:  Positive for constipation and heartburn. Negative for abdominal pain, blood in stool, diarrhea, melena and vomiting.  Genitourinary:  Negative for dysuria and urgency.  Musculoskeletal:  Negative for joint pain and myalgias.  Skin:  Negative for itching and rash.  Neurological:  Negative for dizziness and headaches.  Psychiatric/Behavioral:   Negative for depression. The patient is not nervous/anxious.      Objective: BP 136/88 (BP Location: Left Arm, Patient Position: Sitting, Cuff Size: Large)   Pulse 76   Temp 97.8 F (36.6 C) (Temporal)   Ht 4\' 10"  (1.473 m)   Wt 142 lb 3.2 oz (64.5 kg)   BMI 29.72 kg/m  Physical Exam Constitutional:      Appearance: Normal appearance.  HENT:     Head: Normocephalic and atraumatic.  Eyes:     Extraocular Movements: Extraocular movements intact.     Conjunctiva/sclera: Conjunctivae normal.  Cardiovascular:     Rate and Rhythm: Normal rate and regular rhythm.  Pulmonary:     Effort: Pulmonary effort is normal.     Breath sounds: Normal breath sounds.  Abdominal:     General: Bowel sounds are normal.     Palpations: Abdomen is soft.  Musculoskeletal:        General: No swelling. Normal range of motion.     Cervical back: Normal range of motion and neck supple.  Skin:    General: Skin is warm and dry.     Coloration: Skin is not jaundiced.  Neurological:     General: No focal deficit present.     Mental Status: She is alert and oriented to person, place, and time.  Psychiatric:        Mood and Affect: Mood normal.        Behavior: Behavior normal.      Assessment/Plan:  1.  Chronic constipation- Currently taking MiraLAX daily and Linzess 145 mcg 2x weekly.  Improvement in symptoms.    2.  Chronic GERD, hiatal hernia, esophageal dysphagia-not a surgical candidate for hiatal hernia repair given her age and comorbidities.  Continue on pantoprazole twice daily.  Patient to avoid tough textures. All meats should be chopped finely. Eat slowly, take small bites, chew thoroughly, and drink plenty of liquids throughout meals. Advised if something were to get hung in patient's esophagus and not come up or go down, they should proceed to the emergency room.  Follow up in 6 months  04/27/2023 9:59 AM   Disclaimer: This note was dictated with voice recognition software. Similar  sounding words can inadvertently be transcribed and may not be corrected upon review.

## 2023-10-10 ENCOUNTER — Ambulatory Visit: Admitting: Gastroenterology

## 2023-10-10 ENCOUNTER — Encounter: Payer: Self-pay | Admitting: Gastroenterology

## 2023-10-10 VITALS — BP 131/74 | HR 66 | Temp 98.3°F | Ht <= 58 in | Wt 153.8 lb

## 2023-10-10 DIAGNOSIS — K449 Diaphragmatic hernia without obstruction or gangrene: Secondary | ICD-10-CM

## 2023-10-10 DIAGNOSIS — K5904 Chronic idiopathic constipation: Secondary | ICD-10-CM | POA: Diagnosis not present

## 2023-10-10 DIAGNOSIS — R1010 Upper abdominal pain, unspecified: Secondary | ICD-10-CM

## 2023-10-10 DIAGNOSIS — K219 Gastro-esophageal reflux disease without esophagitis: Secondary | ICD-10-CM | POA: Insufficient documentation

## 2023-10-10 DIAGNOSIS — K5900 Constipation, unspecified: Secondary | ICD-10-CM

## 2023-10-10 MED ORDER — LACTULOSE 10 GM/15ML PO SOLN: ORAL | 1 refills | Status: DC

## 2023-10-10 NOTE — Patient Instructions (Signed)
 Start lactulose  30 mL once to twice daily for constipation. Use the dose that best controls your constipation.  You can stop miralax  and Linzess  for now. If you need Linzess  occasionally for persistent constipation, you can use it.   Complete labs at any [facility].  We will schedule you for CT scan of your abdomen.   We will be in touch with results as available.  Please compare the medication list we provided today with your medications at home. Call if we need to make any corrections.

## 2023-10-10 NOTE — Progress Notes (Signed)
 GI Office Note    Referring Provider: Maree Isles, MD Primary Care Physician:  Maree Isles, MD  Primary Gastroenterologist: Carlin POUR. Cindie, DO   Chief Complaint   Chief Complaint  Patient presents with   Follow-up    Having issues with constipation states that linzess  alone doesn't work and when she takes the linzess  and miralax  together when she gets the urge she has to go.    History of Present Illness   Olivia Powers is a 83 y.o. female presenting today for follow up constipation, GERD. Last seen in 04/2023. Regimen consists of miralax  daily, Linzess  twice per week (due to expense). Pantoprazole  BID.   Discussed the use of AI scribe software for clinical note transcription with the patient, who gave verbal consent to proceed.   Since her last ov, she states her previous bowel regimen of Miralax  daily and Linzess  twice a week became ineffective. She started using Linzess  daily for a few days and alternated with miralax  daily for few days, trying to reduce her expense. She notes that she started taking Linzess  every day along with miralax  and now her stools are too loose and has no control. She denies melena, brbpr. She is having pain in the upper abdomen. Seems to get hard and fill up. She is taking a blue and white pill for pain but does not recall name.    She reports a decreased appetite and unintentional weight loss, stating that 'nothing tastes the same no more' and she has 'no interest' in eating. She is also careful with what she eats due to her chronic dysphagia. She tolerates toast with PBJ but not regular bread. She denies heartburn.  Wt Readings from Last 5 Encounters:  10/10/23 153 lb 12.8 oz (69.8 kg)  04/27/23 142 lb 3.2 oz (64.5 kg)  03/15/23 153 lb (69.4 kg)  01/13/23 154 lb 3.2 oz (69.9 kg)  11/18/22 157 lb 12.8 oz (71.6 kg)    Prior Data   Colonoscopy about 13 years ago per patient.   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      allopurinol  (ZYLOPRIM ) 100 MG tablet Take 100 mg by mouth daily.     gabapentin (NEURONTIN) 300 MG capsule Take 300 mg by mouth 2 (two) times daily. Occ takes more than twice a day     ketoconazole  (NIZORAL ) 2 % cream Apply twice daily to irritated belly button for 10 days or at least 2 days after rash resolves. 30 g 3   linaclotide  (LINZESS ) 145 MCG CAPS capsule Take 1 capsule (145 mcg total) by mouth daily before breakfast. 30 capsule 5   losartan  (COZAAR ) 25 MG tablet Take 25 mg by mouth daily.     Melatonin 5 MG TABS Take 10 mg by mouth at bedtime.     MYRBETRIQ 50 MG TB24 tablet Take 50 mg by mouth daily.     pantoprazole  (PROTONIX ) 40 MG tablet Take 1 tablet (40 mg total) by mouth 2 (two) times daily before a meal. 60 tablet 3   polyethylene glycol (MIRALAX  / GLYCOLAX ) packet Take 17 g by mouth as directed. Take one capful once to twice daily, consider taking two capfuls every other day     simvastatin  (ZOCOR ) 20 MG tablet Take  20 mg by mouth daily.     No current facility-administered medications for this visit.    Allergies   Allergies as of 10/10/2023 - Review Complete 10/10/2023  Allergen Reaction Noted   Codeine Nausea And Vomiting 02/07/2014   Tramadol  06/30/2017        Review of Systems   General: Negative for  weight loss, fever, chills, fatigue, weakness. See hpi ENT: Negative for hoarseness, difficulty swallowing , nasal congestion. CV: Negative for chest pain, angina, palpitations, dyspnea on exertion, peripheral edema.  Respiratory: Negative for dyspnea at rest, dyspnea on exertion, cough, sputum, wheezing.  GI: See history of present illness. GU:  Negative for dysuria, hematuria, urinary incontinence, urinary frequency, nocturnal urination.   Endo: Negative for unusual weight change.     Physical Exam   BP 131/74 (BP Location: Right Arm, Patient Position: Sitting, Cuff Size: Normal)   Pulse 66   Temp 98.3 F (36.8 C) (Oral)   Ht 4' 9 (1.448 m)   Wt 153 lb 12.8 oz (69.8 kg)   SpO2 98%   BMI 33.28 kg/m    General: Well-nourished, well-developed in no acute distress.  Eyes: No icterus. Mouth: Oropharyngeal mucosa moist and pink   Abdomen: Bowel sounds are normal,  nondistended, no hepatosplenomegaly or masses,  no abdominal bruits or hernia , no rebound or guarding. Rectus diastasis. Moderate upper abdominal tenderness, more in epigastric region Rectal: not performed Extremities: No lower extremity edema. No clubbing or deformities. Neuro: Alert and oriented x 4   Skin: Warm and dry, no jaundice.   Psych: Alert and cooperative, normal mood and affect.  Labs   Lab Results  Component Value Date   NA 141 09/02/2022   CL 103 09/02/2022   K 4.5 09/02/2022   CO2 25 09/02/2022   BUN 15 09/02/2022   CREATININE 0.99 (H) 09/02/2022   EGFR 56 (L) 09/02/2022   CALCIUM 10.1 09/02/2022   ALBUMIN 4.3 08/21/2018   GLUCOSE 93 09/02/2022   Lab Results  Component Value Date   ALT 8 09/02/2022   AST 15 09/02/2022   ALKPHOS 76 08/21/2018   BILITOT 0.6 09/02/2022   Lab Results  Component Value Date   WBC 6.7 09/02/2022   HGB 14.2 09/02/2022   HCT 44.0 09/02/2022   MCV 89.6 09/02/2022   PLT 299 09/02/2022    Imaging Studies   No results found.  Assessment/Plan:       Epigastric abdominal pain Persistent pain with tenderness, exacerbated by movement, associated with fullness and gas. Previous endoscopy last year with large hiatal hernia. - CT A/P with contrast - CBC, CMET, lipase - continue pantoprazole  40mg  BID.    Chronic constipation Chronic constipation with Miralax  daily/Linzess  prn less effective and now with daily miralax /Linzess  causing loose stools. Cost of Linzess  is a concern for her. - will hold  both miralax  and Linzess  for now, may use on prn basis if needed. -start lactulose  30mL up to BID.     GERD/large hiatal hernia: controlled -continue pantoprazole  40mg  BID    Sonny RAMAN. Ezzard, MHS, PA-C [facility]

## 2023-10-11 ENCOUNTER — Telehealth: Payer: Self-pay | Admitting: *Deleted

## 2023-10-11 NOTE — Telephone Encounter (Signed)
 [facility] [place] for CT: Case Number: [id number] Review Date: 10/11/2023 7:55:47 AM Expiration Date: N/A Status: This member's benefit plan did not require a prior authorization for this request.

## 2023-10-11 NOTE — Telephone Encounter (Signed)
 Pt informed that CT was scheduled for Tuesday 11/08/23, arrive at 4:45 pm at [facility] to check in.

## 2023-11-08 ENCOUNTER — Ambulatory Visit (HOSPITAL_COMMUNITY)

## 2023-12-04 ENCOUNTER — Emergency Department (HOSPITAL_COMMUNITY)

## 2023-12-04 ENCOUNTER — Other Ambulatory Visit: Payer: Self-pay

## 2023-12-04 ENCOUNTER — Emergency Department (HOSPITAL_COMMUNITY)
Admission: EM | Admit: 2023-12-04 | Discharge: 2023-12-04 | Disposition: A | Discharge status: Home / Self Care | Attending: Emergency Medicine | Admitting: Emergency Medicine

## 2023-12-04 DIAGNOSIS — N3000 Acute cystitis without hematuria: Secondary | ICD-10-CM | POA: Insufficient documentation

## 2023-12-04 DIAGNOSIS — K449 Diaphragmatic hernia without obstruction or gangrene: Secondary | ICD-10-CM | POA: Diagnosis not present

## 2023-12-04 DIAGNOSIS — K224 Dyskinesia of esophagus: Secondary | ICD-10-CM | POA: Diagnosis not present

## 2023-12-04 DIAGNOSIS — R1013 Epigastric pain: Secondary | ICD-10-CM

## 2023-12-04 DIAGNOSIS — R109 Unspecified abdominal pain: Secondary | ICD-10-CM | POA: Diagnosis present

## 2023-12-04 LAB — URINALYSIS, ROUTINE W REFLEX MICROSCOPIC
Bilirubin Urine: NEGATIVE
Glucose, UA: NEGATIVE mg/dL
Hgb urine dipstick: NEGATIVE
Ketones, ur: NEGATIVE mg/dL
Nitrite: NEGATIVE
Protein, ur: NEGATIVE mg/dL
Specific Gravity, Urine: 1.017 (ref 1.005–1.030)
pH: 6 (ref 5.0–8.0)

## 2023-12-04 LAB — COMPREHENSIVE METABOLIC PANEL WITH GFR
ALT: <5 U/L (ref 0–44)
AST: 17 U/L (ref 15–41)
Albumin: 4.1 g/dL (ref 3.5–5.0)
Alkaline Phosphatase: 80 U/L (ref 38–126)
Anion gap: 11 (ref 5–15)
BUN: 14 mg/dL (ref 8–23)
CO2: 26 mmol/L (ref 22–32)
Calcium: 9.6 mg/dL (ref 8.9–10.3)
Chloride: 104 mmol/L (ref 98–111)
Creatinine, Ser: 0.98 mg/dL (ref 0.44–1.00)
GFR, Estimated: 56 mL/min — ABNORMAL LOW (ref >60)
Glucose, Bld: 103 mg/dL — ABNORMAL HIGH (ref 70–99)
Potassium: 4.1 mmol/L (ref 3.5–5.1)
Sodium: 141 mmol/L (ref 135–145)
Total Bilirubin: 0.7 mg/dL (ref 0.0–1.2)
Total Protein: 6.7 g/dL (ref 6.5–8.1)

## 2023-12-04 LAB — LIPASE, BLOOD: Lipase: 18 U/L (ref 11–51)

## 2023-12-04 LAB — CBC
HCT: 39.9 % (ref 36.0–46.0)
Hemoglobin: 12.9 g/dL (ref 12.0–15.0)
MCH: 30.2 pg (ref 26.0–34.0)
MCHC: 32.3 g/dL (ref 30.0–36.0)
MCV: 93.4 fL (ref 80.0–100.0)
Platelets: 267 K/uL (ref 150–400)
RBC: 4.27 MIL/uL (ref 3.87–5.11)
RDW: 13.2 % (ref 11.5–15.5)
WBC: 5.6 K/uL (ref 4.0–10.5)
nRBC: 0 % (ref 0.0–0.2)

## 2023-12-04 MED ORDER — SODIUM CHLORIDE 0.9 % IV BOLUS
1000.0000 mL | Freq: Once | INTRAVENOUS | Status: AC
  Administered 2023-12-04: 1000 mL via INTRAVENOUS

## 2023-12-04 MED ORDER — CEPHALEXIN 500 MG PO CAPS: 500.0000 mg | ORAL_CAPSULE | Freq: Four times a day (QID) | ORAL | 0 refills | Status: DC

## 2023-12-04 MED ORDER — GLUCAGON HCL RDNA (DIAGNOSTIC) 1 MG IJ SOLR
1.0000 mg | Freq: Once | INTRAMUSCULAR | Status: AC
  Administered 2023-12-04: 1 mg via INTRAMUSCULAR
  Filled 2023-12-04: qty 1

## 2023-12-04 MED ORDER — IOHEXOL 300 MG/ML  SOLN
100.0000 mL | Freq: Once | INTRAMUSCULAR | Status: AC | PRN
  Administered 2023-12-04: 100 mL via INTRAVENOUS

## 2023-12-04 MED ORDER — ONDANSETRON 4 MG PO TBDP
4.0000 mg | ORAL_TABLET | Freq: Once | ORAL | Status: AC
  Administered 2023-12-04: 4 mg via ORAL
  Filled 2023-12-04: qty 1

## 2023-12-04 MED ORDER — ONDANSETRON HCL 4 MG/2ML IJ SOLN: 4.0000 mg | Freq: Once | INTRAMUSCULAR | Status: DC | PRN

## 2023-12-04 NOTE — ED Provider Notes (Signed)
 [place] EMERGENCY DEPARTMENT AT [facility] Provider Note   CSN: [id number] Arrival date & time: 12/04/23  9267     Patient presents with: Abdominal Pain   Olivia Powers is a 83 y.o. female.  {Add pertinent medical, surgical, social history, OB history to HPI:32947} HPI     Prior to Admission medications   Medication Sig Start Date End Date Taking? Authorizing Provider  acetaminophen  (TYLENOL ) 500 MG tablet Take 1,000 mg by mouth every 6 (six) hours as needed for moderate pain or headache.     [provider]  allopurinol  (ZYLOPRIM ) 100 MG tablet Take 100 mg by mouth daily.    [provider]  gabapentin (NEURONTIN) 300 MG capsule Take 300 mg by mouth 2 (two) times daily. Occ takes more than twice a day    [provider]  ketoconazole  (NIZORAL ) 2 % cream Apply twice daily to irritated belly button for 10 days or at least 2 days after rash resolves. 03/03/22   Cindie Carlin POUR, DO  lactulose  (CHRONULAC ) 10 GM/15ML solution Take 30 mLs once to twice daily for constipation. 10/10/23   Ezzard Sonny RAMAN, PA-C  linaclotide  (LINZESS ) 145 MCG CAPS capsule Take 1 capsule (145 mcg total) by mouth daily as needed. 10/10/23   Ezzard Sonny RAMAN, PA-C  losartan  (COZAAR ) 25 MG tablet Take 25 mg by mouth daily.    [provider]  Melatonin 5 MG TABS Take 10 mg by mouth at bedtime.    [provider]  MYRBETRIQ 50 MG TB24 tablet Take 50 mg by mouth daily. 09/27/23   [provider]  pantoprazole  (PROTONIX ) 40 MG tablet Take 1 tablet (40 mg total) by mouth 2 (two) times daily before a meal. 11/18/22   Rudy Josette RAMAN, PA-C  polyethylene glycol (MIRALAX  / GLYCOLAX ) packet Take 17 g by mouth daily as needed.    [provider]  simvastatin  (ZOCOR ) 20 MG tablet Take 20 mg by mouth daily.    [provider]    Allergies: Codeine and Tramadol    Review of Systems  Updated Vital Signs BP (!) 143/62   Pulse (!) 59   Temp  97.9 F (36.6 C) (Oral)   Resp 15   Ht 4' 10 (1.473 m)   Wt 72.6 kg   SpO2 96%   BMI 33.44 kg/m   Physical Exam  (all labs ordered are listed, but only abnormal results are displayed) Labs Reviewed  COMPREHENSIVE METABOLIC PANEL WITH GFR - Abnormal; Notable for the following components:      Result Value   Glucose, Bld 103 (*)    GFR, Estimated 56 (*)    All other components within normal limits  URINALYSIS, ROUTINE W REFLEX MICROSCOPIC - Abnormal; Notable for the following components:   Leukocytes,Ua TRACE (*)    Bacteria, UA RARE (*)    All other components within normal limits  LIPASE, BLOOD  CBC    EKG: EKG Interpretation Date/Time:  Sunday December 04 2023 08:43:25 EST Ventricular Rate:  62 PR Interval:  211 QRS Duration:  143 QT Interval:  408 QTC Calculation: 415 R Axis:   91  Text Interpretation: Sinus rhythm RBBB and LPFB No acute changes No significant change since last tracing Confirmed by Charlyn Sora ([id number]) on 12/04/2023 9:43:54 AM  Radiology: [facility] Chest 2 View Result Date: 12/04/2023 EXAM: 2 VIEW(S) XRAY OF THE CHEST 12/04/2023 09:23:29 AM COMPARISON: None available. CLINICAL HISTORY: Dysphagia, has hiatal hernia hx FINDINGS: LUNGS AND PLEURA: Low  lung volumes. No focal pulmonary opacity. No pulmonary edema. No pleural effusion. No pneumothorax. HEART AND MEDIASTINUM: Small hiatal hernia. Aortic atherosclerosis. BONES AND SOFT TISSUES: Cervical spine hardware. No acute osseous abnormality. IMPRESSION: 1. No acute cardiopulmonary disease. 2. Small hiatal hernia. Electronically signed by: Norleen Kil MD 12/04/2023 09:42 AM EST RP Workstation: [id number]    {Document cardiac monitor, telemetry assessment procedure when appropriate:32947} Procedures   Medications Ordered in the ED  ondansetron  (ZOFRAN ) injection 4 mg (has no administration in time range)  sodium chloride  0.9 % bolus 1,000 mL (has no administration in time range)  glucagon (human  recombinant) (GLUCAGEN) injection 1 mg (1 mg Intramuscular Given 12/04/23 1140)  ondansetron  (ZOFRAN -ODT) disintegrating tablet 4 mg (4 mg Oral Given 12/04/23 1139)  iohexol (OMNIPAQUE) 300 MG/ML solution 100 mL (100 mLs Intravenous Contrast Given 12/04/23 1558)      {Click here for ABCD2, HEART and other calculators REFRESH Note before signing:1}                              Medical Decision Making Amount and/or Complexity of Data Reviewed Labs: ordered. Radiology: ordered.  Risk Prescription drug management.   This patient presents to the ED with chief complaint(s) of abdominal discomfort, difficulty in swallowing with pertinent past medical history of esophageal dysmotility, dysphagia, diabetes, hiatal hernia.The complaint involves an extensive differential diagnosis and also carries with it a high risk of complications and morbidity.    The differential diagnosis includes : Food impaction, esophageal dysmotility, esophagitis, mediastinitis, gastritis, obstruction  The initial plan is to get basic labs.  We will try to hydrate patient.  I will consult GI.   Additional history obtained: Additional history obtained from family Records reviewed previous scopes, it appears that patient has small hernia.  Independent labs interpretation:  The following labs were independently interpreted: CBC her metabolic profile is normal.  Independent visualization and interpretation of imaging: - I independently visualized the following imaging with scope of interpretation limited to determining acute life threatening conditions related to emergency care: X-ray of the chest, which revealed no evidence of mediastinal widening or free air.  Treatment and Reassessment: Patient given glucagon, oral fluid.  She was able to tolerate.  She is complaining of some abdominal discomfort in the lower part of her chest.  Consultation: - Consulted or discussed management/test interpretation with external  professional: Dr. Cinderella, GI. He has reviewed patient's chart including previous workup by Dr. Cindie.  If patient is able to tolerate p.o., then he is comfortable patient following up with GI doctors.  Patient has passed p.o. challenge with liquids.  Will get CT chest abdomen pelvis with contrast to make sure there is no concerning finding.  If that is reassuring then she will follow-up with GI doctors.  This plan has been discussed with the patient and family.   Final diagnoses:  Esophageal dysmotility  Epigastric pain    ED Discharge Orders     None

## 2023-12-04 NOTE — ED Triage Notes (Signed)
 Pt arrived POV with c/o abd pain, and feels like food is getting stuck. No diarrhea noted.

## 2023-12-04 NOTE — Discharge Instructions (Signed)
 The workup in the emergency room is reassuring.  Please start clear liquid diet.  Call Dr. Tonie office tomorrow for a close appointment within 2 to 3 days.  Return to the emergency room if you are unable to keep any fluid down, you start having worsening pain.

## 2023-12-05 ENCOUNTER — Encounter: Payer: Self-pay | Admitting: Gastroenterology

## 2023-12-05 ENCOUNTER — Ambulatory Visit: Admitting: Gastroenterology

## 2023-12-05 VITALS — BP 138/76 | HR 80 | Temp 98.4°F | Ht <= 58 in | Wt 152.6 lb

## 2023-12-05 DIAGNOSIS — K449 Diaphragmatic hernia without obstruction or gangrene: Secondary | ICD-10-CM | POA: Diagnosis not present

## 2023-12-05 DIAGNOSIS — K219 Gastro-esophageal reflux disease without esophagitis: Secondary | ICD-10-CM

## 2023-12-05 DIAGNOSIS — K5904 Chronic idiopathic constipation: Secondary | ICD-10-CM

## 2023-12-05 LAB — URINE CULTURE

## 2023-12-05 MED ORDER — LINACLOTIDE 145 MCG PO CAPS: 145.0000 ug | ORAL_CAPSULE | Freq: Every day | ORAL | 11 refills | Status: AC

## 2023-12-05 NOTE — Patient Instructions (Addendum)
 Please make sure you are taking pantoprazole  40mg  twice daily before breakfast and supper. You can take Miralax  one capful every day. You can take Linzess  145mcg once daily before breakfast.  Goal is to achieve an adequate bowel movement that provides relief but if you have several loose stools daily you should cut back on Linzess  to every other day. Complete the antibiotics (cephalexin) provided by ER last night.  Please see your PCP (Dr. Maree) this week as planned.  If you have recurrent or significant abdominal pain, please call us  or go to the ER. I wonder if your pain is coming from your large hiatal hernia causing some intermittent compression.  Avoid tough textures. All meats/chicken should be chopped finely. Eat cooked vegetables instead of raw vegetables. Eat moist meats. Avoid dry breads. Rice may be difficult to get down with large hiatal hernia. Eat slowly, take small bites, chew thoroughly, and drink plenty of liquids throughout meals. Sit upright at least 30 minutes after taking medications. Return office visit in 6 weeks with Dr. Cindie.

## 2023-12-05 NOTE — Progress Notes (Signed)
 GI Office Note    Referring Provider: Maree Isles, MD Primary Care Physician:  Maree Isles, MD  Primary Gastroenterologist: Carlin POUR. Cindie, DO   Chief Complaint   Chief Complaint  Patient presents with   Follow-up    Follow up from ED for abd pain and her swallowing (with food getting stuck in her throat)    History of Present Illness   Olivia Powers is a 83 y.o. female presenting today for ED follow up.  She was last seen on October 10, 2023 for follow-up of constipation, GERD. She wants to discuss abdominal pain and dysphagia.  History of large hiatal hernia, not felt to be a surgical candidate for repair given her age and comorbidities.  At time of last visit her previous bowel regimen of daily MiraLAX  and Linzess  145mcg twice weekly became ineffective.  Started using Linzess  daily for few days and alternated with MiraLAX  daily for a few days trying to reduce her expense.  She began taking both every day and then her stools became too loose and she developed some incontinence.  She also complained of epigastric pain, decreased appetite, unintentional weight loss.  Really no documented weight loss however. We had her hold MiraLAX  and Linzess , started her on lactulose .  Continue pantoprazole  40 mg twice daily.  I recommended labs and CT, these were never completed.  She was seen in the ED yesterday with complaints of abdominal pain and feeling like food was stuck in her esophagus. Lipase, LFTs, CBC were normal.  UA with trace leukocytes, rare bacteria, 11-20 WBC per high-power field.  Urine culture pending.  CT chest abdomen pelvis with contrast completed yesterday showing large hiatal hernia, scattered colonic diverticulosis without diverticulitis.  Punctate focus of gas within the bladder lumen, possibly due to recent catheterization, correlate with procedural history.  Discussed the use of AI scribe software for clinical note transcription with the patient, who gave verbal  consent to proceed.   She experiences severe abdominal pain that began over the weekend after dining out with her cousins. Went out for Chinese, ate orange chicken, no rice. The pain is located in the upper abdomen, right under her bra line, and radiates down the sides. Initially, she was hesitant to take any medication due to fear of adverse effects. The pain persisted into the next day, prompting a visit to the emergency room where she was given medication, Cefalexin, for a suspected urinary tract infection.  She has difficulties with swallowing, noting that cold drinks can be felt in her throat and sometimes food gets stuck, leading to coughing and choking. This issue was particularly notable after eating orange chicken Friday. She mentioned this during her ED evaluation, chest xray, CT chest completed. Able to take orally while in ED.   Her bowel habits are irregular, with infrequent and inadequate bowel movements. We have struggled to find the right bowel regimen for her. Lactulose  she states did not work right away and she was afraid to continue with it, afraid of fecal impaction. She is currently taking Linzess  145mcg twice a week and Miralax  daily. Stools are often liquid but only tiny amounts. Has small amount of stool most days. No melena, brbpr. She has a history of stopping Linzess  and Miralax  due to ineffectiveness and cost concerns but has resumed them to manage her symptoms.  She also reports memory issues, feeling that her memory is declining, which has been noticed by her family. She was instructed to speak with her PCP  regarding this.  She is currently taking Cefalexin four times a day for a urinary tract infection.       Prior Data   Colonoscopy about 13 years ago per patient.   EGD 10/2022: -tortuous esophagus -large hiatal hernia  -few small Cameron erosions without ulceration noted, s/p bx, neg h.pylori   Medications   Current Outpatient Medications  Medication Sig  Dispense Refill   acetaminophen  (TYLENOL ) 500 MG tablet Take 1,000 mg by mouth every 6 (six) hours as needed for moderate pain or headache.      allopurinol  (ZYLOPRIM ) 100 MG tablet Take 100 mg by mouth daily.     cephALEXin (KEFLEX) 500 MG capsule Take 1 capsule (500 mg total) by mouth 4 (four) times daily. 28 capsule 0   gabapentin (NEURONTIN) 300 MG capsule Take 300 mg by mouth 2 (two) times daily. Occ takes more than twice a day     ketoconazole  (NIZORAL ) 2 % cream Apply twice daily to irritated belly button for 10 days or at least 2 days after rash resolves. 30 g 3   linaclotide  (LINZESS ) 145 MCG CAPS capsule Take 1 capsule (145 mcg total) by mouth daily as needed.     losartan  (COZAAR ) 25 MG tablet Take 25 mg by mouth daily.     Melatonin 5 MG TABS Take 10 mg by mouth at bedtime.     MYRBETRIQ 50 MG TB24 tablet Take 50 mg by mouth daily.     pantoprazole  (PROTONIX ) 40 MG tablet Take 1 tablet (40 mg total) by mouth 2 (two) times daily before a meal. 60 tablet 3   polyethylene glycol (MIRALAX  / GLYCOLAX ) packet Take 17 g by mouth daily as needed.     simvastatin  (ZOCOR ) 20 MG tablet Take 20 mg by mouth daily.     No current facility-administered medications for this visit.    Allergies   Allergies as of 12/05/2023 - Review Complete 12/05/2023  Allergen Reaction Noted   Codeine Nausea And Vomiting 02/07/2014   Tramadol  06/30/2017    Review of Systems   General: Negative for anorexia, weight loss, fever, chills, fatigue, weakness. ENT: Negative for hoarseness, difficulty swallowing , nasal congestion. See hpi CV: Negative for chest pain, angina, palpitations, dyspnea on exertion, peripheral edema.  Respiratory: Negative for dyspnea at rest, dyspnea on exertion, cough, sputum, wheezing.  GI: See history of present illness. GU:  Negative for dysuria, hematuria, urinary incontinence, urinary frequency, nocturnal urination. Urinary incontinence. Endo: Negative for unusual weight  change.     Physical Exam   BP 138/76   Pulse 80   Temp 98.4 F (36.9 C)   Ht 4' 9 (1.448 m)   Wt 152 lb 9.6 oz (69.2 kg)   BMI 33.02 kg/m    General: Well-nourished, well-developed in no acute distress. Anxious appearing. Eyes: No icterus. Mouth: Oropharyngeal mucosa moist and pink   Lungs: Clear to auscultation bilaterally.  Heart: Regular rate and rhythm, no murmurs rubs or gallops.  Abdomen: Bowel sounds are normal,  nondistended, no hepatosplenomegaly or masses,  no abdominal bruits or hernia , no rebound or guarding.  Mild epigastric tenderness Rectal: not performed Extremities: No lower extremity edema. No clubbing or deformities. Neuro: Alert and oriented x 4   Skin: Warm and dry, no jaundice.   Psych: Alert and cooperative, normal mood and affect.  Labs   Lab Results  Component Value Date   NA 141 12/04/2023   CL 104 12/04/2023   K 4.1 12/04/2023  CO2 26 12/04/2023   BUN 14 12/04/2023   CREATININE 0.98 12/04/2023   GFRNONAA 56 (L) 12/04/2023   CALCIUM 9.6 12/04/2023   ALBUMIN 4.1 12/04/2023   GLUCOSE 103 (H) 12/04/2023   Lab Results  Component Value Date   ALT <5 12/04/2023   AST 17 12/04/2023   ALKPHOS 80 12/04/2023   BILITOT 0.7 12/04/2023   Lab Results  Component Value Date   WBC 5.6 12/04/2023   HGB 12.9 12/04/2023   HCT 39.9 12/04/2023   MCV 93.4 12/04/2023   PLT 267 12/04/2023   Lab Results  Component Value Date   LIPASE 18 12/04/2023    Imaging Studies   CT CHEST ABDOMEN PELVIS W CONTRAST Result Date: 12/04/2023 CLINICAL DATA:  Epigastric pain, dysphagia EXAM: CT CHEST, ABDOMEN, AND PELVIS WITH CONTRAST TECHNIQUE: Multidetector CT imaging of the chest, abdomen and pelvis was performed following the standard protocol during bolus administration of intravenous contrast. RADIATION DOSE REDUCTION: This exam was performed according to the departmental dose-optimization program which includes automated exposure control, adjustment of the mA  and/or kV according to patient size and/or use of iterative reconstruction technique. CONTRAST:  OMNIPAQUE IOHEXOL 300 MG/ML  SOLN COMPARISON:  12/04/2023 FINDINGS: CT CHEST FINDINGS Cardiovascular: The heart is unremarkable without pericardial effusion. No evidence of thoracic aortic aneurysm or dissection. Atherosclerosis of the aorta and coronary vasculature. Mediastinum/Nodes: Large hiatal hernia. Thyroid , trachea, and esophagus are unremarkable. No pathologic adenopathy. Lungs/Pleura: No acute airspace disease, effusion, or pneumothorax. Central airways are patent. Musculoskeletal: No acute or destructive bony abnormalities. Reconstructed images demonstrate no additional findings. CT ABDOMEN PELVIS FINDINGS Hepatobiliary: No focal liver abnormality is seen. No gallstones, gallbladder wall thickening, or biliary dilatation. Pancreas: Unremarkable. No pancreatic ductal dilatation or surrounding inflammatory changes. Spleen: Normal in size without focal abnormality. Adrenals/Urinary Tract: Adrenal glands are unremarkable. Kidneys are normal, without renal calculi, focal lesion, or hydronephrosis. Bladder is decompressed, with a punctate focus of gas in the bladder lumen possibly due to recent catheterization. Stomach/Bowel: No bowel obstruction or ileus. Scattered colonic diverticulosis without diverticulitis. No bowel wall thickening or inflammatory change. Large hiatal hernia. Vascular/Lymphatic: Aortic atherosclerosis. No enlarged abdominal or pelvic lymph nodes. Reproductive: Status post hysterectomy. No adnexal masses. Other: No free fluid or free intraperitoneal gas. No abdominal wall hernia. Musculoskeletal: Unremarkable bilateral hip arthroplasties. Postsurgical changes from multi level lumbar fusion and discectomy. Reconstructed images demonstrate no additional findings. IMPRESSION: 1. Large hiatal hernia. 2. Scattered colonic diverticulosis without diverticulitis. 3. Punctate focus of gas within  the bladder lumen, which may be due to recent catheterization. Please correlate with procedural history. 4. Aortic Atherosclerosis (ICD10-I70.0). Coronary artery atherosclerosis. Electronically Signed   By: Ozell Daring M.D.   On: 12/04/2023 16:40   DG Chest 2 View Result Date: 12/04/2023 EXAM: 2 VIEW(S) XRAY OF THE CHEST 12/04/2023 09:23:29 AM COMPARISON: None available. CLINICAL HISTORY: Dysphagia, has hiatal hernia hx FINDINGS: LUNGS AND PLEURA: Low lung volumes. No focal pulmonary opacity. No pulmonary edema. No pleural effusion. No pneumothorax. HEART AND MEDIASTINUM: Small hiatal hernia. Aortic atherosclerosis. BONES AND SOFT TISSUES: Cervical spine hardware. No acute osseous abnormality. IMPRESSION: 1. No acute cardiopulmonary disease. 2. Small hiatal hernia. Electronically signed by: Norleen Kil MD 12/04/2023 09:42 AM EST RP Workstation: [id number]    Assessment/Plan:   Epigastric abdominal pain Acute onset Friday after eating out. Kept her up couple of nights. Associated with nausea and sensation of food in the chest. ED evaluation with CT chest/abd/pelvis with large hiatal hernia. Query intermittent incarceration  of the hernia resultingin pain.   - She will verify that she is taking pantoprazole  40mg  BID before meals. - if recurrent or significant abdominal pain, vomiting, and/or fever, she will go to the ED.     Chronic constipation Chronic constipation, difficult to find the best regimen for her. Did not respond to lactulose  initially so she quit it. Currently taking miralax  daily with Linzess  about twice per week and dose not feel her stool is adequate although stools are soft/loose.  - continue Miralax  daily. - Linzess  145mcg once daily before breakfast. Samples provided.  - Goal is to acheve adequate bowel movement but if stools very loose or several per day, she will cut back on Linzess  to every other day.    GERD/large hiatal hernia/dysphagia:  -continue pantoprazole  40mg   BID -monitor for recurrent abdominal pain, nausea/vomiting, fever for which she should go to ED -Avoid tough textures. All meats/chicken should be chopped finely. Eat cooked vegetables instead of raw vegetables. Eat moist meats. Avoid dry breads. Rice may be difficult to get down with large hiatal hernia. Eat slowly, take small bites, chew thoroughly, and drink plenty of liquids throughout meals. Sit upright at least 30 minutes after taking medications. -patient not felt to be a good surgical candidate due to age and comorbidities AND patient preference to avoid surgery -return ov in six weeks with Dr. Cindie.        Sonny RAMAN. Ezzard, MHS, PA-C [facility]

## 2023-12-05 NOTE — Addendum Note (Signed)
 Addended by: JEANELL GRAEME RAMAN on: 12/05/2023 11:13 AM   Modules accepted: Orders

## 2023-12-21 ENCOUNTER — Encounter: Payer: Self-pay | Admitting: Internal Medicine

## 2024-02-02 ENCOUNTER — Encounter: Payer: Self-pay | Admitting: Gastroenterology

## 2024-02-13 ENCOUNTER — Encounter: Payer: Self-pay | Admitting: Gastroenterology

## 2024-02-13 ENCOUNTER — Ambulatory Visit: Admitting: Gastroenterology

## 2024-02-13 VITALS — BP 131/74 | HR 78 | Temp 98.0°F | Ht 58.5 in | Wt 149.2 lb

## 2024-02-13 DIAGNOSIS — R350 Frequency of micturition: Secondary | ICD-10-CM | POA: Diagnosis not present

## 2024-02-13 DIAGNOSIS — K449 Diaphragmatic hernia without obstruction or gangrene: Secondary | ICD-10-CM | POA: Diagnosis not present

## 2024-02-13 DIAGNOSIS — R1319 Other dysphagia: Secondary | ICD-10-CM

## 2024-02-13 DIAGNOSIS — R1310 Dysphagia, unspecified: Secondary | ICD-10-CM

## 2024-02-13 DIAGNOSIS — R1013 Epigastric pain: Secondary | ICD-10-CM

## 2024-02-13 DIAGNOSIS — K5909 Other constipation: Secondary | ICD-10-CM

## 2024-02-13 DIAGNOSIS — K219 Gastro-esophageal reflux disease without esophagitis: Secondary | ICD-10-CM | POA: Diagnosis not present

## 2024-02-13 DIAGNOSIS — R1084 Generalized abdominal pain: Secondary | ICD-10-CM

## 2024-02-13 NOTE — Progress Notes (Signed)
 "    GI Office Note    Referring Provider: Maree Isles, MD Primary Care Physician:  Maree Isles, MD  Primary Gastroenterologist: Carlin POUR. Cindie, DO    Chief Complaint   Chief Complaint  Patient presents with   Follow-up    Having occasional abdominal pain that she has questions about.    History of Present Illness   Olivia Powers is a 84 y.o. female presenting today for follow up. Last seen in 12/2023. History of GERD, dysphagia,  constipation.  History of large hiatal hernia, not felt to be a surgical candidate for repair given her age and comorbidities.   Discussed the use of AI scribe software for clinical note transcription with the patient, who gave verbal consent to proceed.  History of Present Illness    Since her last ov, her abdominal pain is intermittent, sometimes severe, located in the mid abdomen with occasional radiation to the left back. It feels like pressure in the upper abdomen, and she points to the midline and left side. She currently denies heartburn. She is not sure if pain is worse with meals or associated with bowel movements. Does not pain is occasional.  She has dysphagia mainly with large pills and cold liquids, described as a sensation of food or water  sitting in her chest for a while. When this happens she avoids eating until it passes. She worries her esophagus is getting narrower and is afraid to swallow large pills, especially at night. She denies burning in her chest but is unsure if food is going down normally.  She has a bowel movement daily on Miralax , which she takes every day, and uses Linzess  as needed. About once or twice a week she becomes constipated for 2 to 3 days, which improves with Linzess .  She has urinary incontinence with nocturnal frequency but no dysuria or burning. She is worried about her kidneys, though she reports recent kidney labs as normal.       Prior Data     Results       Latest Ref Rng & Units 12/04/2023     8:48 AM 09/02/2022    2:39 PM 07/07/2022   11:54 AM  CBC  WBC 4.0 - 10.5 K/uL 5.6  6.7  6.7   Hemoglobin 12.0 - 15.0 g/dL 87.0  85.7  86.1   Hematocrit 36.0 - 46.0 % 39.9  44.0  42.5   Platelets 150 - 400 K/uL 267  299  234       Latest Ref Rng & Units 12/04/2023    8:48 AM 09/02/2022    2:39 PM 08/25/2018    3:00 AM  BMP  Glucose 70 - 99 mg/dL 896  93  876   BUN 8 - 23 mg/dL 14  15  18    Creatinine 0.44 - 1.00 mg/dL 9.01  9.00  9.05   BUN/Creat Ratio 6 - 22 (calc)  15    Sodium 135 - 145 mmol/L 141  141  140   Potassium 3.5 - 5.1 mmol/L 4.1  4.5  4.0   Chloride 98 - 111 mmol/L 104  103  107   CO2 22 - 32 mmol/L 26  25  23    Calcium 8.9 - 10.3 mg/dL 9.6  89.8  9.0       Latest Ref Rng & Units 12/04/2023    8:48 AM 09/02/2022    2:39 PM 08/21/2018   11:15 AM  Hepatic Function  Total Protein 6.5 - 8.1 g/dL 6.7  7.2  7.4   Albumin 3.5 - 5.0 g/dL 4.1   4.3   AST 15 - 41 U/L 17  15  15    ALT 0 - 44 U/L 5  8  11    Alk Phosphatase 38 - 126 U/L 80   76   Total Bilirubin 0.0 - 1.2 mg/dL 0.7  0.6  1.0      Chest/abd/pelvis 12/04/2023: IMPRESSION: 1. Large hiatal hernia. 2. Scattered colonic diverticulosis without diverticulitis. 3. Punctate focus of gas within the bladder lumen, which may be due to recent catheterization. Please correlate with procedural history. 4. Aortic Atherosclerosis (ICD10-I70.0). Coronary artery atherosclerosis.  EGD 10/2022: -tortuous esophagus -large hiatal hernia  -few small Cameron erosions without ulceration noted, s/p bx, neg h.pylori  Colonoscopy around 2010 per patient  Wt Readings from Last 7 Encounters:  02/13/24 149 lb 3.2 oz (67.7 kg)  12/05/23 152 lb 9.6 oz (69.2 kg)  12/04/23 160 lb (72.6 kg)  10/10/23 153 lb 12.8 oz (69.8 kg)  04/27/23 142 lb 3.2 oz (64.5 kg)  03/15/23 153 lb (69.4 kg)  01/13/23 154 lb 3.2 oz (69.9 kg)     Medications   Current Outpatient Medications  Medication Sig Dispense Refill   acetaminophen  (TYLENOL )  500 MG tablet Take 1,000 mg by mouth every 6 (six) hours as needed for moderate pain or headache.      allopurinol  (ZYLOPRIM ) 100 MG tablet Take 100 mg by mouth daily.     gabapentin (NEURONTIN) 300 MG capsule Take 300 mg by mouth 2 (two) times daily. Occ takes more than twice a day     ketoconazole  (NIZORAL ) 2 % cream Apply twice daily to irritated belly button for 10 days or at least 2 days after rash resolves. 30 g 3   linaclotide  (LINZESS ) 145 MCG CAPS capsule Take 1 capsule (145 mcg total) by mouth daily before breakfast. 30 capsule 11   losartan  (COZAAR ) 25 MG tablet Take 25 mg by mouth daily.     Melatonin 5 MG TABS Take 10 mg by mouth at bedtime.     MYRBETRIQ 50 MG TB24 tablet Take 50 mg by mouth daily.     pantoprazole  (PROTONIX ) 40 MG tablet Take 1 tablet (40 mg total) by mouth 2 (two) times daily before a meal. 60 tablet 3   polyethylene glycol (MIRALAX  / GLYCOLAX ) packet Take 17 g by mouth daily as needed.     simvastatin  (ZOCOR ) 20 MG tablet Take 20 mg by mouth daily.     No current facility-administered medications for this visit.    Allergies   Allergies as of 02/13/2024 - Review Complete 02/13/2024  Allergen Reaction Noted   Codeine Nausea And Vomiting 02/07/2014   Tramadol  06/30/2017     Past Medical History   Past Medical History:  Diagnosis Date   Alopecia    Arthritis    Cancer (HCC)    skin cancer of chest melanoma   Diabetes mellitus without complication (HCC)    taken off Metformin by PCP this year.   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   Hypercholesteremia    Hypertension     Past Surgical History   Past Surgical History:  Procedure Laterality Date   ABDOMINAL HYSTERECTOMY     BACK SURGERY     lower   BIOPSY  10/25/2022   Procedure: BIOPSY;  Surgeon: Cindie Carlin POUR, DO;  Location: [facility];  Service:  Endoscopy;;   CATARACT EXTRACTION W/PHACO Left 02/11/2014   Procedure: CATARACT EXTRACTION  PHACO AND INTRAOCULAR LENS PLACEMENT LEFT EYE;  Surgeon: Cherene Mania, MD;  Location: [facility];  Service: Ophthalmology;  Laterality: Left;  CDE:4.93   CATARACT EXTRACTION W/PHACO Right 03/11/2014   Procedure: CATARACT EXTRACTION PHACO AND INTRAOCULAR LENS PLACEMENT RIGHT EYE CDE=8.52;  Surgeon: Cherene Mania, MD;  Location: [facility];  Service: Ophthalmology;  Laterality: Right;   CERVICAL DISC SURGERY     CESAREAN SECTION     x2   CRANIECTOMY FOR EXCISION OF ACOUSTIC NEUROMA Left 1990, 1992   ESOPHAGOGASTRODUODENOSCOPY N/A 04/22/2014   Procedure: ESOPHAGOGASTRODUODENOSCOPY (EGD);  Surgeon: Toribio SHAUNNA Cedar, MD;  Location: [facility];  Service: Endoscopy;  Laterality: N/A;   ESOPHAGOGASTRODUODENOSCOPY (EGD) WITH PROPOFOL  N/A 10/25/2022   Procedure: ESOPHAGOGASTRODUODENOSCOPY (EGD) WITH PROPOFOL ;  Surgeon: Cindie Carlin POUR, DO;  Location: [facility];  Service: Endoscopy;  Laterality: N/A;  8:15 am, asa 3   TOTAL HIP ARTHROPLASTY Left 07/06/2017   Procedure: LEFT TOTAL HIP ARTHROPLASTY ANTERIOR APPROACH;  Surgeon: Melodi Lerner, MD;  Location: [facility];  Service: Orthopedics;  Laterality: Left;  Failed Spinal   TOTAL HIP ARTHROPLASTY Right 08/23/2018   Procedure: TOTAL HIP ARTHROPLASTY ANTERIOR APPROACH;  Surgeon: Melodi Lerner, MD;  Location: [facility];  Service: Orthopedics;  Laterality: Right;    TOTAL KNEE ARTHROPLASTY Bilateral 1990    Past Family History   Family History  Family history unknown: Yes    Past Social History   Social History   Socioeconomic History   Marital status: Widowed    Spouse name: Not on file   Number of children: Not on file   Years of education: Not on file   Highest education level: Not on file  Occupational History   Not on file  Tobacco Use   Smoking status: Never    Passive exposure: Past   Smokeless tobacco: Never  Vaping Use   Vaping status: Never Used  Substance and Sexual Activity   Alcohol  use: No   Drug use: No   Sexual activity: Not  Currently    Birth control/protection: Surgical  Other Topics Concern   Not on file  Social History Narrative   Not on file   Social Drivers of Health   Tobacco Use: Low Risk (02/13/2024)   Patient History    Smoking Tobacco Use: Never    Smokeless Tobacco Use: Never    Passive Exposure: Past  Financial Resource Strain: Not on file  Food Insecurity: Not on file  Transportation Needs: Not on file  Physical Activity: Not on file  Stress: Not on file  Social Connections: Not on file  Intimate Partner Violence: Not on file  Depression (EYV7-0): Not on file  Alcohol  Screen: Not on file  Housing: Not on file  Utilities: Not on file  Health Literacy: Not on file    Review of Systems   General: Negative for anorexia,  fever, chills, fatigue, weakness.mild weight loss/wt fluctuates ENT: Negative for hoarseness, difficulty swallowing , nasal congestion. CV: Negative for chest pain, angina, palpitations, dyspnea on exertion, peripheral edema.  Respiratory: Negative for dyspnea at rest, dyspnea on exertion, cough, sputum, wheezing.  GI: See history of present illness. GU:  Negative for dysuria, hematuria, ++urinary incontinence, ++urinary frequency, ++nocturnal urination.  Endo: Negative for unusual weight change.     Physical Exam   BP 131/74   Pulse 78   Temp 98 F (36.7 C) (Oral)   Ht 4' 10.5 (1.486  m)   Wt 149 lb 3.2 oz (67.7 kg)   SpO2 95%   BMI 30.65 kg/m    General: Well-nourished, well-developed in no acute distress. Anxious appearing Eyes: No icterus. Mouth: Oropharyngeal mucosa moist and pink   Abdomen: Bowel sounds are normal,  nondistended, no hepatosplenomegaly or masses,  no abdominal bruits or hernia , no rebound or guarding. Mild epigastric tenderness. Rectal: not performed Extremities: No lower extremity edema. No clubbing or deformities. Neuro: Alert and oriented x 4   Skin: Warm and dry, no jaundice.   Psych: Alert and cooperative, normal mood and  affect.  Labs   See above Imaging Studies   No results found.  Assessment/Plan:    Assessment & Plan GERD/dysphagia/epigastric pain/large hiatal hernia   Chronic symptomatic hiatal hernia with GERD, epigastric pain, and intermittent dysphagia. She has had previous remote barium esophagram in 12/2019 showing nearly half the stomach in the chest at that time, with moderate esophageal dysmotility and minimally dilated and hypotonic esophagus. I suspect this is source of her dysphagia but cannot exclude development of esophageal stricture. Suspect intermittent abdominal pain due to large hiatal hernia, patient not felt to be good candidate for surgery due to age and comorbidities. - Continued pantoprazole  40 mg twice daily. - Recommend noninvasive reassessment for esophageal stricture -advised to chop all meat finely, eat slowly/small bites, cook vegetables, avoid hard textures, use plenty of fluid when eating/taking medications  Chronic constipation Chronic constipation with regular bowel movements - Continued Miralax  daily and linaclotide  as needed. - Encouraged increased oral fluid intake to improve constipation  Urinary frequency -check u/a with culture   Total time spent 27 minutes  Sonny RAMAN. Ezzard, MHS, PA-C [facility]  "

## 2024-02-13 NOTE — Patient Instructions (Signed)
 For constipation: Continue miralax  daily. Continue Linzess  daily as needed if you go more than 48 hours without a bowel movement.   For GERD/acid reflux/hiatal hernia: Continue pantoprazole  40mg  twice daily before a meal  For frequent urine: Complete urine test to rule out infection. You can have this done at your local lab, take our orders with you. Please call us  if you don't here about your urine results within 3 days after your submit specimen.  For swallowing issues: I will let you know if Dr. Cindie feels he could stretch your esophagus or if he recommends xray first.

## 2024-02-17 ENCOUNTER — Ambulatory Visit: Payer: Self-pay | Admitting: Gastroenterology

## 2024-02-17 MED ORDER — NITROFURANTOIN MONOHYD MACRO 100 MG PO CAPS: 100.0000 mg | ORAL_CAPSULE | Freq: Two times a day (BID) | ORAL | 0 refills | Status: DC

## 2024-02-18 LAB — UA/M W/RFLX CULTURE, ROUTINE
Bilirubin, UA: NEGATIVE
Glucose, UA: NEGATIVE
Ketones, UA: NEGATIVE
Nitrite, UA: NEGATIVE
RBC, UA: NEGATIVE
Specific Gravity, UA: 1.016 (ref 1.005–1.030)
Urobilinogen, Ur: 1 mg/dL (ref 0.2–1.0)
pH, UA: 7.5 (ref 5.0–7.5)

## 2024-02-18 LAB — MICROSCOPIC EXAMINATION
Casts: NONE SEEN /LPF
RBC, Urine: NONE SEEN /HPF (ref 0–2)
WBC, UA: >30 /HPF — AB (ref 0–5)

## 2024-02-18 LAB — URINE CULTURE, REFLEX

## 2024-02-19 MED ORDER — CIPROFLOXACIN HCL 500 MG PO TABS: 500.0000 mg | ORAL_TABLET | Freq: Two times a day (BID) | ORAL | 0 refills | Status: AC

## 2024-02-21 ENCOUNTER — Other Ambulatory Visit: Payer: Self-pay | Admitting: *Deleted

## 2024-02-21 DIAGNOSIS — R1319 Other dysphagia: Secondary | ICD-10-CM

## 2024-02-22 ENCOUNTER — Ambulatory Visit (HOSPITAL_COMMUNITY)

## 2024-03-01 ENCOUNTER — Telehealth: Payer: Self-pay

## 2024-03-01 NOTE — Telephone Encounter (Signed)
 Please let patient know that the esophagram is not painful. This test involves having xrays of her esophagus performed while she drinks contrast in varying consistencies and also swallows a barium tablet.    Let me know if she has additional questions, I will not be able to call her personally today if there are additional questions, it would have to be tomorrow. Hopefully this addresses her concerns.

## 2024-03-01 NOTE — Telephone Encounter (Signed)
 Pt was made aware and stated that she will call back to reschedule after she talks to her daughter and all of the snow and ice has cleared.

## 2024-03-01 NOTE — Telephone Encounter (Signed)
 Pt called and stated that she is scared to do the esophagram and is wanting to speak to the provider before rescheduling it because she is scared and has questions pertaining to the esophagram. Pt stated that she heard that it is painful.
